# Patient Record
Sex: Female | Born: 1935
Health system: Southern US, Community
[De-identification: ages and names within clinical notes are randomized; demographics above are authoritative.]

## PROBLEM LIST (undated history)

## (undated) DIAGNOSIS — K219 Gastro-esophageal reflux disease without esophagitis: Secondary | ICD-10-CM

## (undated) DIAGNOSIS — K5790 Diverticulosis of intestine, part unspecified, without perforation or abscess without bleeding: Secondary | ICD-10-CM

## (undated) DIAGNOSIS — B9681 Helicobacter pylori [H. pylori] as the cause of diseases classified elsewhere: Secondary | ICD-10-CM

## (undated) DIAGNOSIS — K222 Esophageal obstruction: Secondary | ICD-10-CM

## (undated) DIAGNOSIS — T7840XA Allergy, unspecified, initial encounter: Secondary | ICD-10-CM

## (undated) DIAGNOSIS — I10 Essential (primary) hypertension: Secondary | ICD-10-CM

## (undated) DIAGNOSIS — K2970 Gastritis, unspecified, without bleeding: Secondary | ICD-10-CM

## (undated) DIAGNOSIS — I499 Cardiac arrhythmia, unspecified: Secondary | ICD-10-CM

## (undated) DIAGNOSIS — R55 Syncope and collapse: Secondary | ICD-10-CM

## (undated) DIAGNOSIS — K648 Other hemorrhoids: Secondary | ICD-10-CM

## (undated) HISTORY — PX: TONSILLECTOMY: SHX5217

## (undated) HISTORY — DX: Diverticulosis of intestine, part unspecified, without perforation or abscess without bleeding: K57.90

## (undated) HISTORY — DX: Allergy, unspecified, initial encounter: T78.40XA

## (undated) HISTORY — PX: OTHER SURGICAL HISTORY: SHX169

## (undated) HISTORY — DX: Other hemorrhoids: K64.8

## (undated) HISTORY — DX: Cardiac arrhythmia, unspecified: I49.9

## (undated) HISTORY — DX: Helicobacter pylori (H. pylori) as the cause of diseases classified elsewhere: B96.81

## (undated) HISTORY — DX: Esophageal obstruction: K22.2

## (undated) HISTORY — DX: Syncope and collapse: R55

## (undated) HISTORY — PX: TONSILLECTOMY: SUR1361

## (undated) HISTORY — DX: Essential (primary) hypertension: I10

## (undated) HISTORY — DX: Gastro-esophageal reflux disease without esophagitis: K21.9

## (undated) HISTORY — DX: Gastritis, unspecified, without bleeding: K29.70

---

## 1972-10-29 HISTORY — PX: ABDOMINAL HYSTERECTOMY: SHX81

## 1998-04-10 ENCOUNTER — Emergency Department (HOSPITAL_COMMUNITY): Admission: EM | Admit: 1998-04-10 | Discharge: 1998-04-10 | Payer: Self-pay | Admitting: Emergency Medicine

## 1998-04-21 ENCOUNTER — Encounter: Admission: RE | Admit: 1998-04-21 | Discharge: 1998-07-20 | Payer: Self-pay | Admitting: Sports Medicine

## 1999-01-27 ENCOUNTER — Other Ambulatory Visit: Admission: RE | Admit: 1999-01-27 | Discharge: 1999-01-27 | Payer: Self-pay | Admitting: Obstetrics and Gynecology

## 1999-12-27 ENCOUNTER — Encounter: Admission: RE | Admit: 1999-12-27 | Discharge: 1999-12-27 | Payer: Self-pay | Admitting: Internal Medicine

## 2000-02-02 ENCOUNTER — Encounter: Admission: RE | Admit: 2000-02-02 | Discharge: 2000-02-22 | Payer: Self-pay | Admitting: Sports Medicine

## 2000-06-07 ENCOUNTER — Other Ambulatory Visit: Admission: RE | Admit: 2000-06-07 | Discharge: 2000-06-07 | Payer: Self-pay | Admitting: *Deleted

## 2001-01-23 ENCOUNTER — Encounter: Admission: RE | Admit: 2001-01-23 | Discharge: 2001-01-23 | Payer: Self-pay | Admitting: Internal Medicine

## 2001-01-29 ENCOUNTER — Emergency Department (HOSPITAL_COMMUNITY): Admission: EM | Admit: 2001-01-29 | Discharge: 2001-01-29 | Payer: Self-pay | Admitting: Emergency Medicine

## 2001-01-29 ENCOUNTER — Encounter: Payer: Self-pay | Admitting: Emergency Medicine

## 2001-07-04 ENCOUNTER — Other Ambulatory Visit: Admission: RE | Admit: 2001-07-04 | Discharge: 2001-07-04 | Payer: Self-pay | Admitting: Obstetrics and Gynecology

## 2002-01-30 ENCOUNTER — Encounter: Admission: RE | Admit: 2002-01-30 | Discharge: 2002-01-30 | Payer: Self-pay | Admitting: Internal Medicine

## 2002-10-29 DIAGNOSIS — K5790 Diverticulosis of intestine, part unspecified, without perforation or abscess without bleeding: Secondary | ICD-10-CM

## 2002-10-29 HISTORY — DX: Diverticulosis of intestine, part unspecified, without perforation or abscess without bleeding: K57.90

## 2003-02-03 ENCOUNTER — Encounter: Admission: RE | Admit: 2003-02-03 | Discharge: 2003-02-03 | Payer: Self-pay | Admitting: Internal Medicine

## 2003-12-13 ENCOUNTER — Emergency Department (HOSPITAL_COMMUNITY): Admission: EM | Admit: 2003-12-13 | Discharge: 2003-12-13 | Payer: Self-pay | Admitting: Emergency Medicine

## 2003-12-24 ENCOUNTER — Encounter: Admission: RE | Admit: 2003-12-24 | Discharge: 2003-12-24 | Payer: Self-pay | Admitting: Internal Medicine

## 2004-03-17 ENCOUNTER — Other Ambulatory Visit: Admission: RE | Admit: 2004-03-17 | Discharge: 2004-03-17 | Payer: Self-pay | Admitting: Obstetrics and Gynecology

## 2004-03-24 ENCOUNTER — Encounter: Admission: RE | Admit: 2004-03-24 | Discharge: 2004-03-24 | Payer: Self-pay | Admitting: Internal Medicine

## 2004-07-10 ENCOUNTER — Ambulatory Visit (HOSPITAL_COMMUNITY): Admission: RE | Admit: 2004-07-10 | Discharge: 2004-07-10 | Payer: Self-pay | Admitting: Internal Medicine

## 2004-10-29 DIAGNOSIS — B9681 Helicobacter pylori [H. pylori] as the cause of diseases classified elsewhere: Secondary | ICD-10-CM

## 2004-10-29 HISTORY — DX: Helicobacter pylori (H. pylori) as the cause of diseases classified elsewhere: B96.81

## 2004-11-20 ENCOUNTER — Ambulatory Visit: Payer: Self-pay | Admitting: Internal Medicine

## 2004-11-21 ENCOUNTER — Ambulatory Visit: Payer: Self-pay

## 2004-11-27 ENCOUNTER — Ambulatory Visit: Payer: Self-pay | Admitting: Adult Health

## 2005-01-26 ENCOUNTER — Ambulatory Visit: Payer: Self-pay | Admitting: Internal Medicine

## 2005-04-17 ENCOUNTER — Ambulatory Visit: Payer: Self-pay | Admitting: Internal Medicine

## 2005-04-19 ENCOUNTER — Encounter: Admission: RE | Admit: 2005-04-19 | Discharge: 2005-04-19 | Payer: Self-pay | Admitting: Internal Medicine

## 2005-05-15 ENCOUNTER — Ambulatory Visit: Payer: Self-pay | Admitting: Internal Medicine

## 2005-07-11 ENCOUNTER — Ambulatory Visit: Payer: Self-pay | Admitting: Gastroenterology

## 2005-07-16 ENCOUNTER — Encounter (INDEPENDENT_AMBULATORY_CARE_PROVIDER_SITE_OTHER): Payer: Self-pay | Admitting: Specialist

## 2005-07-16 ENCOUNTER — Ambulatory Visit: Payer: Self-pay | Admitting: Gastroenterology

## 2005-11-13 ENCOUNTER — Encounter (INDEPENDENT_AMBULATORY_CARE_PROVIDER_SITE_OTHER): Payer: Self-pay | Admitting: Cardiology

## 2005-11-13 ENCOUNTER — Ambulatory Visit: Payer: Self-pay | Admitting: Internal Medicine

## 2005-11-13 ENCOUNTER — Inpatient Hospital Stay (HOSPITAL_COMMUNITY): Admission: EM | Admit: 2005-11-13 | Discharge: 2005-11-14 | Payer: Self-pay | Admitting: Emergency Medicine

## 2005-11-22 ENCOUNTER — Ambulatory Visit: Payer: Self-pay | Admitting: Internal Medicine

## 2005-12-07 ENCOUNTER — Ambulatory Visit: Payer: Self-pay | Admitting: Internal Medicine

## 2006-01-04 ENCOUNTER — Ambulatory Visit: Payer: Self-pay | Admitting: Internal Medicine

## 2006-01-07 ENCOUNTER — Ambulatory Visit: Payer: Self-pay | Admitting: Gastroenterology

## 2006-01-15 ENCOUNTER — Ambulatory Visit: Payer: Self-pay | Admitting: Gastroenterology

## 2006-04-25 ENCOUNTER — Encounter: Admission: RE | Admit: 2006-04-25 | Discharge: 2006-04-25 | Payer: Self-pay | Admitting: Internal Medicine

## 2006-07-09 ENCOUNTER — Ambulatory Visit: Payer: Self-pay | Admitting: Internal Medicine

## 2007-05-29 ENCOUNTER — Encounter: Admission: RE | Admit: 2007-05-29 | Discharge: 2007-05-29 | Payer: Self-pay | Admitting: Family Medicine

## 2008-08-09 ENCOUNTER — Encounter: Admission: RE | Admit: 2008-08-09 | Discharge: 2008-08-09 | Payer: Self-pay | Admitting: Family Medicine

## 2008-08-18 ENCOUNTER — Encounter: Admission: RE | Admit: 2008-08-18 | Discharge: 2008-08-18 | Payer: Self-pay | Admitting: Family Medicine

## 2009-08-10 ENCOUNTER — Encounter: Admission: RE | Admit: 2009-08-10 | Discharge: 2009-08-10 | Payer: Self-pay | Admitting: Family Medicine

## 2010-06-19 ENCOUNTER — Emergency Department (HOSPITAL_COMMUNITY): Admission: EM | Admit: 2010-06-19 | Discharge: 2010-06-19 | Payer: Self-pay | Admitting: Emergency Medicine

## 2010-08-14 ENCOUNTER — Encounter: Admission: RE | Admit: 2010-08-14 | Discharge: 2010-08-14 | Payer: Self-pay | Admitting: Family Medicine

## 2010-08-25 ENCOUNTER — Encounter: Admission: RE | Admit: 2010-08-25 | Discharge: 2010-08-25 | Payer: Self-pay | Admitting: Family Medicine

## 2010-11-19 ENCOUNTER — Encounter: Payer: Self-pay | Admitting: Family Medicine

## 2010-11-20 ENCOUNTER — Encounter: Payer: Self-pay | Admitting: Family Medicine

## 2011-03-16 NOTE — Assessment & Plan Note (Signed)
[facility]                               PULMONARY OFFICE NOTE   NAME:SCOTTNovalee, Horsfall                         MRN:          [id number]  DATE:07/09/2006                            DOB:          August 15, 1931    HISTORY:  This is a 77 year old black female who continues to complain of  burning and numbness in her right leg but has no significant back pain.  She has been having increasing pain and also a restlessness at night since  she stopped trazodone about a month ago.  She denies any overt symptoms of  depression, fevers, chills, night sweats, weight loss.   MEDICATIONS:  For full inventory of medications,  please see patient sheet  dated July 09, 2006.   PHYSICAL EXAMINATION:  GENERAL:  She is a somewhat depressed ambulatory  black female in no acute distress.  VITAL SIGNS:  Stable.  HEENT:  Unremarkable.  LUNGS:  Lung fields are perfectly clear bilaterally.  HEART:  There is a regular rhythm without murmur, gallop or rub.  ABDOMEN:  Soft. Benign.  EXTREMITIES: Warm without calf tenderness, cyanosis, clubbing or edema. Her  lower extremity exam was normal with negative  straight leg raising.  Patient did have a  subjective area of numbness over the right anterior  shin.   IMPRESSION:  1. Classic radicular pain.  Previously evaluated by Dr. Phoebe Perch correlating      to a positive MRI scan dated July 10, 2004.  I reminded her that      Dr. Phoebe Perch is a neurosurgeon but even though she does not have back      pain the most likely cause of the burning, numbness she is experiencing      is the previously documented disk  disease.  I would like her to make      an appointment to see Dr. Phoebe Perch.  2. Poor sleep hygiene and possible depression related to chronic leg pain.      I recommend that she restart her trazodone 150 mg at bedtime and      titrate this as high as 300 mg at bedtime if necessary to obtain      adequate sleep.  3. I have  arranged to see the patient back for comprehensive health care      evaluation within the next six weeks. We will see her sooner if needed.                                   Charlaine Dalton. Sherene Sires, MD, [facility]   MBW/MedQ  DD:  07/10/2006  DT:  07/10/2006  Job #:  [id number]   cc:   Clydene Fake, M.D.

## 2011-03-16 NOTE — H&P (Signed)
Alexandria Beck, Alexandria Beck                  ACCOUNT NO.:  [id number]   MEDICAL RECORD NO.:  [id number]          PATIENT TYPE:  INP   LOCATION:  1828                         FACILITY:  [facility]   PHYSICIAN:  Casimiro Needle B. Sherene Sires, M.D. [facility] OF BIRTH:  Mar 04, 1931   DATE OF ADMISSION:  11/13/2005  DATE OF DISCHARGE:                                HISTORY & PHYSICAL   CHIEF COMPLAINT:  Syncope.   HISTORY OF PRESENT ILLNESS:  The patient is a 77 year old black female with  borderline hypertension who was admitted through the ER with new onset  syncope.  She states she was in her usual state of health until going to the  bathroom at approximately midnight last night, feeling that she had extra  gas.  She sat on the commode and did not have a bowel movement but was  nauseated, and then when she tried to stand up, passed out without warning,  striking her face and breaking her top set of dentures.  She came to the  emergency room where she appeared hemodynamically stable in sinus rhythm,  but because of the new onset of syncope, I was asked to evaluate her for  admission by the ER staff.   The patient states she was out for maybe 20 minutes, but when she woke up  she did not have any residual drowsiness or disorientation.  She does have a  history of mild dyspnea on exertion that occurred she noticed on the 13th  when she was hanging laundry but did not notice on the 14th when she was  walking back and forth to church.  She denies any exertional chest pain,  orthopnea, PND, or leg swelling.  She denies any change of bowel or bladder  habits but states she is not used to having gas pains in the middle of the  night.  These have now resolved.   PAST MEDICAL HISTORY:  1.  Chronic rhinitis.  2.  Chronic fatigue with insomnia, treated with trazodone.  3.  Dysphagia, status post upper endoscopy with stricture noted in February      of 2002, repeated October 2004, requiring dilatation by Dr. Arlyce Dice and  therefore maintained on PPI therapy daily.  4.  Osteoporosis, but unable to document in July of 2002, unable to take      bisphosphonates because of sensation of globus consistent with poorly      controlled reflux.  5.  Hypertension, controlled with diet.  6.  Mild hyperlipidemia.  Target LDL less than or 60 based on the absence of      additional risk factors.  7.  Status post partial hysterectomy in 1994, followed by Dr. Myrlene Broker.   MEDICATIONS:  1.  Baby aspirin one daily (the patient said that I told her to stop it, but      actually I told her to use instead of 325 to do the baby dose per my      notes and her written instructions).  2.  Zegerid 40 mg at bedtime.  3.  Centrum Silver one daily.  4.  Trazodone 150 mg which is supposed to titrate from one-half to one at      bedtime as needed.   SOCIAL HISTORY:  She has never smoked.  She has been a widow for about 10  years.  She denies any alcohol use.   FAMILY HISTORY:  Positive for stroke in her mother.  Father had lung cancer  and was a smoker.  There is heart disease in her sister in her 68s, brother  in his 69s.   REVIEW OF SYSTEMS:  Taken in detail and essentially negative, except as  outlined above.   PHYSICAL EXAMINATION:  GENERAL:  This is a pleasant black female with  swollen lower lip.  She does not as appear overtly depressed.  VITAL SIGNS:  She is afebrile with no orthostatic changes.  HEENT:  Remarkable for mild swelling of the lower lip and absent upper  dentures.  There is no evidence of trauma.  NECK:  Supple without cervical adenopathy or tenderness.  Trachea is  midline.  No thyromegaly.  Carotid upstrokes are brisk and without bruits.  CHEST:  Completely clear bilateral to auscultation and percussion.  HEART:  Regular rate and rhythm without murmurs, rubs, or gallops.  ABDOMEN:  Soft with no hepatosplenomegaly, masses, or tenderness.  EXTREMITIES:  Warm and without calf tenderness or edema.  Pedal  pulses were  present bilaterally.  NEUROLOGIC:  No focal deficits.  SKIN:  Warm and dry.   LABORATORY DATA:  EKG was normal.  Initial lab profile (i-STAT) in the ER  was also normal.   IMPRESSION:  1.  New onset syncope that developed with abdominal gas and nausea.  This      most likely represents vagovagal syncope, but because she does have a      positive family history of heart disease and hyperlipidemia, we need to      rule her out for myocardial infarction and make sure she did not have an      ischemia-induced arrhythmia.  I therefore am going to admit her to      telemetry and add Zelnorm to her regimen to cover both gastroesophageal      reflux disease and irritable bowel syndrome from a differential of her      new abdominal complaints (note that these have actually resolved at the      time she is in the emergency room).  I am also going to recommend an      echocardiogram to rule out valvular heart disease, although I do not      hear a significant murmur.  2.  Chronic fatigue and insomnia.  I recommend that she increase the      trazodone up to 150 mg at bedtime.  3.  She seems to be getting a bit confused with her instructions.  Although      she has written instructions on how to take her medicines, she deviates      significantly from this and will need to reemphasize the optimal use of      a patient medicine calendar when she leaves the hospital on this      admission.           ______________________________  Charlaine Dalton Sherene Sires, M.D. [facility]     MBW/MEDQ  D:  11/13/2005  T:  11/13/2005  Job:  [id number]

## 2011-03-16 NOTE — Discharge Summary (Signed)
NAMEKENDYL, FESTA                  ACCOUNT NO.:  [id number]   MEDICAL RECORD NO.:  [id number]          PATIENT TYPE:  INP   LOCATION:  2631                         FACILITY:  [facility]   PHYSICIAN:  Charlaine Dalton. Sherene Sires, M.D. [facility] OF BIRTH:  11/26/1930   DATE OF ADMISSION:  11/13/2005  DATE OF DISCHARGE:  11/14/2005                                 DISCHARGE SUMMARY   FINAL DIAGNOSES:  1.  Syncope, secondary to vasovagal mechanism.  2.  Chronic fatigue and insomnia.  3.  Clinical gastroesophageal reflux disease.  4.  Probably irritable bowel syndrome.  5.  Normocytic anemia, unclear etiology.   HISTORY:  Problem 1.  This is a 77 year old black female previously healthy  who had abdominal distention associated with the urge to have a bowel  movement and went to the bathroom on the night of admission and became  nauseated and passed out, hitting the floor and striking her face.  She  thinks she was maybe out for a few minutes, woke up alert but frightened  by the event, and came to emergency room where she appeared to be in sinus  rhythm with normal EKG and normal enzymes, and had no more symptoms.  She  was admitted overnight and placed on telemetry and had no ectopics.  She was  placed empirically on a combination of Citrucel 1 teaspoon b.i.d. and  Zelnorm 6 mg tablets 1/2 b.i.d. and was requesting discharge in the  afternoon following admission.  She was ambulating on telemetry with no  ectopics noted nor any exertional chest discomfort or recurrent abdominal  complaints.   The patient is, therefore, discharged in improved condition the following  medications:  1.  Centrum Silver 1 daily.  2.  Citrucel 1 teaspoon b.i.d.  3.  Trazodone 150 mg q.h.s.  4.  Zegerid 40 mg q.h.s.  5.  Zelnorm 6 mg tablets 1/2 b.i.d.   Problem 2.  Anemia.  Iron studies are pending at the time of discharge.  She  was asked to stop her aspirin.  Ferritin level 9 this admission consistent  with iron  deficiency. Will be followed up as an outpatient.   LABORATORY DATA:  She has a hematocrit of 30.8% on admission which was  unchanged overnight at 29.8 with ferritin level of 9, potassium level of  3.3.  Otherwise, laboratory studies were unremarkable including troponin,  CPKs, and EKG.   DIET:  Regular.   FOLLOW UP:  In one week at which time a CBC will be rechecked and workup for  iron deficiency initiated.           ______________________________  Charlaine Dalton. Sherene Sires, M.D. [facility]     MBW/MEDQ  D:  11/14/2005  T:  11/15/2005  Job:  [id number]

## 2011-06-26 ENCOUNTER — Inpatient Hospital Stay (INDEPENDENT_AMBULATORY_CARE_PROVIDER_SITE_OTHER)
Admission: RE | Admit: 2011-06-26 | Discharge: 2011-06-26 | Disposition: A | Discharge status: Home / Self Care | Payer: PRIVATE HEALTH INSURANCE | Source: Ambulatory Visit | Attending: Family Medicine | Admitting: Family Medicine

## 2011-06-26 DIAGNOSIS — F410 Panic disorder [episodic paroxysmal anxiety] without agoraphobia: Secondary | ICD-10-CM

## 2011-07-30 ENCOUNTER — Ambulatory Visit (INDEPENDENT_AMBULATORY_CARE_PROVIDER_SITE_OTHER): Payer: No Typology Code available for payment source | Admitting: Internal Medicine

## 2011-07-30 ENCOUNTER — Encounter: Payer: Self-pay | Admitting: Internal Medicine

## 2011-07-30 ENCOUNTER — Other Ambulatory Visit: Payer: Self-pay | Admitting: Internal Medicine

## 2011-07-30 ENCOUNTER — Other Ambulatory Visit: Payer: Self-pay | Admitting: Family Medicine

## 2011-07-30 VITALS — BP 162/88 | HR 102 | Temp 97.9°F | Wt 164.0 lb

## 2011-07-30 DIAGNOSIS — Z1231 Encounter for screening mammogram for malignant neoplasm of breast: Secondary | ICD-10-CM

## 2011-07-30 DIAGNOSIS — Z23 Encounter for immunization: Secondary | ICD-10-CM

## 2011-07-30 DIAGNOSIS — K219 Gastro-esophageal reflux disease without esophagitis: Secondary | ICD-10-CM

## 2011-07-30 DIAGNOSIS — I10 Essential (primary) hypertension: Secondary | ICD-10-CM | POA: Insufficient documentation

## 2011-07-30 DIAGNOSIS — R55 Syncope and collapse: Secondary | ICD-10-CM | POA: Insufficient documentation

## 2011-07-30 MED ORDER — ESOMEPRAZOLE MAGNESIUM 40 MG PO CPDR: 40.0000 mg | DELAYED_RELEASE_CAPSULE | Freq: Every day | ORAL | Status: DC

## 2011-07-30 MED ORDER — CLONAZEPAM 0.5 MG PO TABS: 0.5000 mg | ORAL_TABLET | Freq: Every evening | ORAL | Status: DC | PRN

## 2011-07-30 MED ORDER — ZOLPIDEM TARTRATE 5 MG PO TABS: 5.0000 mg | ORAL_TABLET | Freq: Every evening | ORAL | Status: DC | PRN

## 2011-07-30 MED ORDER — FUROSEMIDE 20 MG PO TABS: 20.0000 mg | ORAL_TABLET | Freq: Every day | ORAL | Status: DC

## 2011-07-30 NOTE — Assessment & Plan Note (Signed)
Patient reports that at drugstore monitoring and at home she will have SBP's 140-150's.  Plan - start diuretic therapy - furosdemide 20 mg once a day.           Follow-up in one month

## 2011-07-30 NOTE — Assessment & Plan Note (Signed)
Well controlled on present medication.  Plan - Rx renewed

## 2011-07-30 NOTE — Patient Instructions (Signed)
Blood pressure - from what you have told me there is chronic elevation in your Blood pressure. Plan - furosemide 2o mg once a day to bring down your pressure.  Sleep - good sleep hygiene: regular time for bed and to rise 7 days a week; no stimulants (coffee, chocolate); daily exercise; sleep sanctuary - no reading or TV watching in bed; no extinction behaviors - do NOT lay in bed awake. If you can't sleep get up and go to another room until you are drowsy and can try to sleep. Medication - to be used every 2nd or 3rd night - zolpidem 5 mg at bedtime.  Please come back to see me in about 1 month. We will review your blood pressure, your sleep and I will have reviewed you labs, etc from your previous doctor.

## 2011-07-30 NOTE — Assessment & Plan Note (Signed)
Clonazepam seems to help. Will continue medication. For persistent symptoms will need to consider further evaluation.

## 2011-07-30 NOTE — Progress Notes (Signed)
Subjective:    Patient ID: Alexandria Beck, female    DOB: 03/16/1931, 77 y.o.   MRN: [id number]  HPI Alexandria Beck presents to establish for on-going continuity care. She is not sleeping well. She is awakened by dreams that cause tachycardia and agitation. She is otherwise doing well.   Past Medical History  Diagnosis Date  . Near syncope     diagnosed as anxiety related. gets some relief with clonazepam. No formal testing done.  [name] GERD (gastroesophageal reflux disease)     has had EGD 2006-negative  . Allergy     seasonal  . Irregular heart beat     no testing, holter, etc. Takes no medication  . Hypertension      On no medication. Home readings SBP=150's   Past Surgical History  Procedure Date  . Abdominal hysterectomy 1974    fibroid tumors w/ metorrhagia  . Tonsillectomy   . G4p4 - nsvd  midwife delivery for two    Family History  Problem Relation Age of Onset  . Heart disease Mother   . Stroke Mother   . Hypertension Mother   . Cancer Father     lung cancer - smoker  . Heart disease Sister     MI  . Kidney disease Sister     end stage renal disease on HD  . Diabetes Sister   . Heart disease Brother   . Diabetes Sister   . Hypertension Sister   . Kidney disease Sister   . Cancer Sister   . Diabetes Sister   . Kidney disease Sister     HD  . Hypertension Sister   . Heart disease Sister   . Hypertension Sister   . Heart disease Sister   . Hypertension Sister   . Hypertension Sister    History   Social History  . Marital Status: Widowed    Spouse Name: N/A    Number of Children: 4  . Years of Education: 11   Occupational History  . stock person     retired   Social History Main Topics  . Smoking status: Never Smoker   . Smokeless tobacco: Never Used  . Alcohol Use: No  . Drug Use: No  . Sexually Active: Not Currently   Other Topics Concern  . Not on file   Social History Narrative   11th grade. Married '52- '92/widowed. 2 sons- '55, '63; 2 dtrs  - '54, '60. 12 grandchildren. 7 greatgrands. Lives in own home with a son who is in and out. Work - [employer], Psychologist, prison and probation services.Do you drink Alcoholic Beverages-No. Do you drink caffienated Beverages- No. Do you use seatbelt often- Yes. Do you exercise at least 3 times a week- . Do you take vitamins- . Do you use a hearing aid- No. Do you wear dentures- Yes. Is there a smoke alarm in your house- Yes. Are there guns and firearms in your household- . Have you experienced physical abuse- No.Usual # of hours of sleep per night 3. Number of people living at your residence- 2       Review of Systems Constitutional:  Negative for fever, chills, activity change and unexpected weight change.  HEENT:  Negative for hearing loss, ear pain, congestion, neck stiffness and postnasal drip. Negative for sore throat or swallowing problems. Negative for dental complaints-has full dentures.   Eyes: Negative for vision loss or change in visual acuity. Was a little blurry due to medication.  Respiratory: Negative for chest tightness and  wheezing. Negative for DOE.   Cardiovascular: Negative for chest pain. Has palpitations. Decreased exercise tolerance over the past 6 months Gastrointestinal: No change in bowel habit. No bloating or gas. No reflux or indigestion Genitourinary: Negative for urgency, frequency, flank pain and difficulty urinating.  Musculoskeletal: Positve for myalgias legs - can't let the covers touch her legs. No back pain. Right hip arthralgias but no gait problem.  Neurological: Negative for dizziness, tremors, weakness and headaches.  Hematological: Negative for adenopathy.  Psychiatric/Behavioral: Negative for behavioral problems and dysphoric mood.       Objective:   Physical Exam Vitals elevated Gen'l - pleasant older AA woman in no distress HEENT - C&SD clear Lungs - CTAP Cor- 2+ radial, RRR w/o m/r/g Ext - w/o edema Neuro - A&O x 3, CN II-XII intact, DTRs normal       Assessment & Plan:    Very pleasant new patient. She is oriented to the practice. She will request recent records from her previous doctor. Recommendations for labs will be based on review of records. She will return in one month.

## 2011-08-09 ENCOUNTER — Telehealth: Payer: Self-pay | Admitting: Internal Medicine

## 2011-08-09 ENCOUNTER — Emergency Department (HOSPITAL_COMMUNITY): Payer: Medicare Other

## 2011-08-09 ENCOUNTER — Inpatient Hospital Stay (HOSPITAL_COMMUNITY): Payer: Medicare Other

## 2011-08-09 ENCOUNTER — Observation Stay (HOSPITAL_COMMUNITY)
Admission: EM | Admit: 2011-08-09 | Discharge: 2011-08-10 | Disposition: A | Discharge status: Home / Self Care | Payer: Medicare Other | Source: Ambulatory Visit | Attending: Internal Medicine | Admitting: Internal Medicine

## 2011-08-09 DIAGNOSIS — R55 Syncope and collapse: Principal | ICD-10-CM | POA: Insufficient documentation

## 2011-08-09 DIAGNOSIS — R0602 Shortness of breath: Secondary | ICD-10-CM | POA: Insufficient documentation

## 2011-08-09 DIAGNOSIS — I10 Essential (primary) hypertension: Secondary | ICD-10-CM | POA: Insufficient documentation

## 2011-08-09 DIAGNOSIS — K219 Gastro-esophageal reflux disease without esophagitis: Secondary | ICD-10-CM | POA: Insufficient documentation

## 2011-08-09 DIAGNOSIS — F411 Generalized anxiety disorder: Secondary | ICD-10-CM | POA: Insufficient documentation

## 2011-08-09 DIAGNOSIS — R209 Unspecified disturbances of skin sensation: Secondary | ICD-10-CM | POA: Insufficient documentation

## 2011-08-09 DIAGNOSIS — R29898 Other symptoms and signs involving the musculoskeletal system: Secondary | ICD-10-CM | POA: Insufficient documentation

## 2011-08-09 DIAGNOSIS — R42 Dizziness and giddiness: Secondary | ICD-10-CM | POA: Insufficient documentation

## 2011-08-09 LAB — COMPREHENSIVE METABOLIC PANEL
AST: 24 U/L (ref 0–37)
CO2: 24 mEq/L (ref 19–32)
Chloride: 104 mEq/L (ref 96–112)
Creatinine, Ser: 0.77 mg/dL (ref 0.50–1.10)
GFR calc non Af Amer: 78 mL/min — ABNORMAL LOW (ref >90)
Total Bilirubin: 0.3 mg/dL (ref 0.3–1.2)

## 2011-08-09 LAB — CARDIAC PANEL(CRET KIN+CKTOT+MB+TROPI)
CK, MB: 3.1 ng/mL (ref 0.3–4.0)
CK, MB: 2.8 ng/mL (ref 0.3–4.0)
Relative Index: 1.7 (ref 0.0–2.5)
Relative Index: 1.2 (ref 0.0–2.5)
Total CK: 178 U/L — ABNORMAL HIGH (ref 7–177)
Total CK: 225 U/L — ABNORMAL HIGH (ref 7–177)
Troponin I: <0.3 ng/mL (ref <0.30)

## 2011-08-09 LAB — DIFFERENTIAL
Basophils Absolute: 0 10*3/uL (ref 0.0–0.1)
Lymphocytes Relative: 33 % (ref 12–46)
Lymphs Abs: 2.4 10*3/uL (ref 0.7–4.0)
Monocytes Absolute: 0.6 10*3/uL (ref 0.1–1.0)
Monocytes Relative: 8 % (ref 3–12)
Neutro Abs: 3.9 10*3/uL (ref 1.7–7.7)

## 2011-08-09 LAB — POCT I-STAT TROPONIN I

## 2011-08-09 LAB — APTT: aPTT: 27 seconds (ref 24–37)

## 2011-08-09 LAB — CBC
HCT: 37.5 % (ref 36.0–46.0)
Hemoglobin: 12.9 g/dL (ref 12.0–15.0)
MCH: 30.4 pg (ref 26.0–34.0)
MCHC: 34.4 g/dL (ref 30.0–36.0)

## 2011-08-09 LAB — URINALYSIS, ROUTINE W REFLEX MICROSCOPIC
Bilirubin Urine: NEGATIVE
Hgb urine dipstick: NEGATIVE
Ketones, ur: NEGATIVE mg/dL
Protein, ur: NEGATIVE mg/dL
Urobilinogen, UA: 0.2 mg/dL (ref 0.0–1.0)

## 2011-08-09 LAB — D-DIMER, QUANTITATIVE: D-Dimer, Quant: 0.41 ug/mL-FEU (ref 0.00–0.48)

## 2011-08-09 LAB — RPR: RPR Ser Ql: NONREACTIVE

## 2011-08-09 LAB — CK TOTAL AND CKMB (NOT AT ARMC): Relative Index: 1.8 (ref 0.0–2.5)

## 2011-08-09 LAB — HEMOGLOBIN A1C: Mean Plasma Glucose: 134 mg/dL — ABNORMAL HIGH (ref <117)

## 2011-08-09 LAB — PROTIME-INR: Prothrombin Time: 13.3 seconds (ref 11.6–15.2)

## 2011-08-09 NOTE — Telephone Encounter (Signed)
Received copies from Dr. Lamount Cohen 08/09/2011. Forwarded  15pages to Dr. Reva Bores review.

## 2011-08-09 NOTE — H&P (Signed)
Alexandria Beck, Alexandria Beck NO.:  [id number]  MEDICAL RECORD NO.:  [id number]  LOCATION:  [facility]                         FACILITY:  [facility]  PHYSICIAN:  Conley Canal, MD      DATE OF BIRTH:  1931-10-28  DATE OF ADMISSION:  08/09/2011 DATE OF DISCHARGE:                             HISTORY & PHYSICAL   PRIMARY CARE PHYSICIAN:  Alexandria Gess. Norins, MD  CHIEF COMPLAINT:  Numbness involving the left occipital area associated with heaviness of the tongue and transient weakness involving the left side x2 days.  HISTORY OF PRESENT ILLNESS:  [name] is a pleasant 77 year old female who has a history of anxiety disorder, insomnia, and gastroesophageal reflux disease who comes in with complaints of numbness which started in the left occipital area since this morning.  She states that she had a funny feeling on that side of the head but did not have any headache. She also felt dizzy and felt heaviness of the tongue followed by some shortness of breath.  She mentions that yesterday she noticed some gait instability on the left side, but this also resolved on its own.  The symptoms today started while she was sitting watching TV, they have since resolved.  She denies chest pain, nausea, or diaphoresis.  No fever.  No diarrhea.  She attributes the symptoms to clonazepam which was started on July 30, 2011, however, she has not taken the clonazepam for a few days now.  In the emergency room, the patient had EKG which showed some nonspecific T-wave abnormalities which are diffuse.  Otherwise, a chest x-ray and CT of the brain were unremarkable as was her CBC and CMP.  She was referred to the Hospitalist Service for further workup.  PAST MEDICAL HISTORY: 1. Anxiety disorder. 2. Gastroesophageal reflux disease. 3. Questionable history of hypertension, not on medications. 4. Insomnia.  HOME MEDICATIONS:  Vitamin B12, Nexium, Lasix, Ambien, clonazepam, and Aleve.  SOCIAL  HISTORY:  The patient lives alone.  Denies cigarette smoking, alcohol, or illicit drugs.  FAMILY HISTORY:  Positive for stroke and MI in her mother.  Diabetes mellitus in patient's sister.  REVIEW OF SYSTEMS:  Unremarkable except as highlighted in the history of present illness.  PHYSICAL EXAMINATION:  GENERAL:  This is a well-looking elderly lady who is not in acute distress. VITAL SIGNS:  Blood pressure 155/80, heart rate is 76, temperature 97.8, respirations 22, and oxygen saturations 100% on room air. HEAD, EARS, NOSE, AND THROAT:  Pupils are equal and reacting to light. No jugular venous distention.  No carotid bruits. RESPIRATORY SYSTEM:  Good air entry bilaterally with no rhonchi, rales, or wheezes. CARDIOVASCULAR SYSTEM:  First and second heart sounds are heard.  No murmurs.  Pulse regular. ABDOMEN:  Soft and nontender.  No palpable organomegaly.  Bowel sounds are normal. CNS:  The patient is alert, oriented to person, place, and time.  She does not have any acute neurological deficits.  No cerebellar signs. EXTREMITIES:  No pedal edema.  Peripheral pulses are equal.  LABORATORY DATA:  Reviewed and significant for normal CBC.  Point-of- care troponins negative x1.  Electrolytes significant for potassium of 3.6, otherwise  essentially normal.  Urinalysis negative.  Chest x-ray shows no acute cardiopulmonary process.  CT of the brain without contrast showed no evidence of acute intracranial abnormality.  EKG showed sinus rhythm with nonspecific ST-T wave changes with flattening of the T-waves throughout the precordial leads.  IMPRESSION:  [name] who has a history of anxiety disorder, gastroesophageal reflux disease and some family history of coronary artery disease who comes in with complaints of numbness and some transient weakness involving the left side associated with some shortness of breath, likely she had a transient ischemic attack  versus posterior cerebral circulation nonhemorrhagic infarct which would be small.  She could potentially have cardiopulmonary process including acute coronary syndrome, unlikely pulmonary embolism. Her blood pressure is also elevated at 155/80.  My suspicion is she likely has essential hypertension.  PLAN: 1. TIA versus acute cardiopulmonary process.  We will admit the     patient to telemetry observation to Dr. Izora Ribas service to pursue     TIA workup including MRI of the brain, bilateral carotid duplex, 2-     D echocardiogram.  Also obtain vitamin B12 level, RPR, TSH, lipids     panel, cardiac enzymes, and D-dimer level.  Meanwhile, the patient     will be started on aspirin, Lopressor, and Zocor. 2. Anxiety disorder.  The patient mentioned that she stopped taking     clonazepam believing that it was causing her problems, but she     still had these symptoms after stopping clonazepam for a few days.     We will keep her on clonazepam for now and defer long-term use     decision to Dr. Debby Bud. 3. Hypertension.  We will start the patient on low-dose beta-blocker. 4. Gastroesophageal reflux disease.  PPI. 5. DVT prophylaxis.  Lovenox. 6. The patient's condition is fair.     Conley Canal, MD     SR/MEDQ  D:  08/09/2011  T:  08/09/2011  Job:  [id number]  cc:   Alexandria Gess. Norins, MD  Electronically Signed by Conley Canal  on 08/09/2011 03:12:56 PM

## 2011-08-10 DIAGNOSIS — R55 Syncope and collapse: Secondary | ICD-10-CM

## 2011-08-10 DIAGNOSIS — I517 Cardiomegaly: Secondary | ICD-10-CM

## 2011-08-10 LAB — CARDIAC PANEL(CRET KIN+CKTOT+MB+TROPI)
CK, MB: 2.6 ng/mL (ref 0.3–4.0)
Total CK: 211 U/L — ABNORMAL HIGH (ref 7–177)
Troponin I: <0.3 ng/mL (ref <0.30)

## 2011-08-10 LAB — GLUCOSE, CAPILLARY: Glucose-Capillary: 104 mg/dL — ABNORMAL HIGH (ref 70–99)

## 2011-08-10 LAB — LIPID PANEL: Cholesterol: 203 mg/dL — ABNORMAL HIGH (ref 0–200)

## 2011-08-17 ENCOUNTER — Ambulatory Visit (INDEPENDENT_AMBULATORY_CARE_PROVIDER_SITE_OTHER): Payer: Medicare Other | Admitting: Internal Medicine

## 2011-08-17 ENCOUNTER — Ambulatory Visit
Admission: RE | Admit: 2011-08-17 | Discharge: 2011-08-17 | Disposition: A | Discharge status: Home / Self Care | Payer: Medicare Other | Source: Ambulatory Visit | Attending: Internal Medicine | Admitting: Internal Medicine

## 2011-08-17 DIAGNOSIS — R29898 Other symptoms and signs involving the musculoskeletal system: Secondary | ICD-10-CM

## 2011-08-17 DIAGNOSIS — I10 Essential (primary) hypertension: Secondary | ICD-10-CM

## 2011-08-17 DIAGNOSIS — Z1231 Encounter for screening mammogram for malignant neoplasm of breast: Secondary | ICD-10-CM

## 2011-08-17 DIAGNOSIS — F419 Anxiety disorder, unspecified: Secondary | ICD-10-CM

## 2011-08-17 DIAGNOSIS — F411 Generalized anxiety disorder: Secondary | ICD-10-CM

## 2011-08-17 DIAGNOSIS — M6281 Muscle weakness (generalized): Secondary | ICD-10-CM

## 2011-08-17 MED ORDER — SERTRALINE HCL 25 MG PO TABS: ORAL_TABLET | ORAL | Status: DC

## 2011-08-17 NOTE — Patient Instructions (Signed)
TIA - evaluation in the hospital was 100% negative. Please continue to take Aspirin daily.  Nerves - will have you try sertraline (zoloft) 25mg  once a day for anxiety. Call me back in 3 weeks to let me know how it is working.

## 2011-08-17 NOTE — Progress Notes (Signed)
  Subjective:    Patient ID: Alexandria Beck, female    DOB: 05/21/1931, 77 y.o.   MRN: [id number]  HPI Mrs. Coste presents for hospital follow-up- she had been admitted to rule out TIA due to transient left sided weakness, thick tongue and a funny feeling in her head. Hospital record reviewed. She had a totally negative work-up. Since discharge she has been doing well. She was taken off Klonipin as a causative agent for tongue thickness and she is asking for something for her "nerves."  PMH, FamHx and SocHx reviewed for any changes and relevance.    Review of Systems Constitutional:  Negative for fever, chills, activity change and unexpected weight change.  HEENT:  Negative for hearing loss, ear pain, congestion, neck stiffness and postnasal drip. Negative for sore throat or swallowing problems. Negative for dental complaints.   Eyes: Negative for vision loss or change in visual acuity.  Respiratory: Negative for chest tightness and wheezing. Negative for DOE.   Cardiovascular: Negative for chest pain or palpitations. No decreased exercise tolerance Gastrointestinal: No change in bowel habit. No bloating or gas. No reflux or indigestion Genitourinary: Negative for urgency, frequency, flank pain and difficulty urinating.  Musculoskeletal: Negative for myalgias, back pain, arthralgias and gait problem.  Neurological: Negative for dizziness, tremors, weakness and headaches.  Hematological: Negative for adenopathy.  Psychiatric/Behavioral: Negative for behavioral problems and dysphoric mood.       Objective:   Physical Exam Vitals - normal Pulm - normal respirations Cor -RRR  Neuro - A&O x 3, CN II-XII grossly in tact. Normal gait. Psych - calm and in no distress       Assessment & Plan:  1. Transient weakness - full evaluation done in hospital which was unrevealing. Her symptoms completely resolved. Her feeling of a thick tongue may have been medication related.  Plan - no further  evaluation needed

## 2011-08-19 DIAGNOSIS — F419 Anxiety disorder, unspecified: Secondary | ICD-10-CM | POA: Insufficient documentation

## 2011-08-19 NOTE — Assessment & Plan Note (Signed)
She reports continued problems with her "nerves" on a daily basis.  Plan - trial of sertraline 25 mg daily

## 2011-08-19 NOTE — Assessment & Plan Note (Signed)
BP Readings from Last 3 Encounters:  08/17/11 136/82  07/30/11 162/88   Good control at today's visit.   Plan - continue diuretic monotherapy

## 2011-08-20 NOTE — Discharge Summary (Signed)
NAMEALIEGHA, PAULLIN                  ACCOUNT NO.:  [id number]  MEDICAL RECORD NO.:  [id number]  LOCATION:                                 FACILITY:  PHYSICIAN:  Rosalyn Gess. Zsazsa Bahena, MD  DATE OF BIRTH:  [birth year]  DATE OF ADMISSION:  08/09/2011 DATE OF DISCHARGE:  08/10/2011                              DISCHARGE SUMMARY   ADMITTING DIAGNOSIS:  Rule out transient ischemic attack.  DISCHARGE DIAGNOSIS:  Transient ischemic attack ruled out.  CONSULTANTS:  None.  PROCEDURES: 1. CT scan of the brain with no evidence of acute intracranial     abnormality performed in the emergency department. 2. Chest x-ray at admission which showed no acute cardiopulmonary     disease. 3. MRI scan of the brain which showed no acute or focal intracranial     abnormality to explain patient's symptoms.  Mild generalized     atrophy and scattered T2 and FLAIR white matter lesions.  These     reflect the sequelae of chronic microvascular ischemia.  Bilateral     remote lacunar infarct versus dilated perivascular spaces of the     basal ganglia noted. 4. MRA of the brain, which showed normal brain MRA, circle of Willis     without evidence for significant proximal stenosis, aneurysm, or     branch vessel occlusion. 5. A 2D echo which showed normal left ventricular cavity.  EF was 60-     65% with normal wall motion.  The patient with grade 1 diastolic     dysfunction. 6. Carotid Doppler study which showed no significant extracranial     carotid artery stenosis.  Vertebrals were patent with antegrade     flow.  HISTORY OF PRESENT ILLNESS:  Ms. Cinelli is a 77 year old woman with a history of anxiety disorder and insomnia who reported she had a sensation of numbness at the left occipital area that was present on the morning of admission.  She also reported that she felt dizzy and had a heaviness of her tongue which she attributed to clonazepam.  She had mentioned that the day prior to admission, she had  some mild gait instability on the left side but this resolved on its own.  On the morning of admission while watching television, the patient reported her symptoms had resolved.  In the emergency department, the patient was awake, alert, and oriented.  She was admitted to rule out TIA.  Please see the H and P and Epic generated records for past medical history, family history, and social history.  HOSPITAL COURSE:  The patient was admitted to the telemetry floor.  She had evaluation that included MRI, MRA of the brain, carotid Doppler, 2D echo, routine laboratory.  The patient's evaluation was normal.  The patient was seen on the 12th and was doing well.  Laboratories were unremarkable with a normal A1c at 6.3%.  B12 was normal at 814.  TSH was normal.  RPR was negative.  Sed rate was 35.  D-dimer was normal at 0.41.  With the patient on exam having no neurologic findings with her labs and imaging studies being normal with PT evaluation having been ordered  and complete showing the patient had no physical therapy needs and had no mobility impairment, she was felt to be stable and ready for discharge home.  Verbal order was given for the patient to be discharged.  The medication discharge manager was completed.  The patient will be seen in the office for followup in 1 week.  Her condition at time of discharge was stable with no evidence of acute intracranial process or active stroke.     Rosalyn Gess Laron Angelini, MD     MEN/MEDQ  D:  08/12/2011  T:  08/13/2011  Job:  [id number]  Electronically Signed by Illene Regulus MD on 08/20/2011 04:22:26 AM

## 2011-08-21 ENCOUNTER — Other Ambulatory Visit: Payer: Self-pay | Admitting: Internal Medicine

## 2011-08-21 DIAGNOSIS — R928 Other abnormal and inconclusive findings on diagnostic imaging of breast: Secondary | ICD-10-CM

## 2011-08-28 ENCOUNTER — Ambulatory Visit (INDEPENDENT_AMBULATORY_CARE_PROVIDER_SITE_OTHER): Payer: Medicare Other | Admitting: Internal Medicine

## 2011-08-28 ENCOUNTER — Encounter: Payer: Self-pay | Admitting: Internal Medicine

## 2011-08-28 VITALS — BP 138/80 | HR 84 | Temp 98.4°F | Resp 16 | Wt 163.0 lb

## 2011-08-28 DIAGNOSIS — L259 Unspecified contact dermatitis, unspecified cause: Secondary | ICD-10-CM

## 2011-08-28 DIAGNOSIS — L509 Urticaria, unspecified: Secondary | ICD-10-CM | POA: Insufficient documentation

## 2011-08-28 DIAGNOSIS — L309 Dermatitis, unspecified: Secondary | ICD-10-CM

## 2011-08-28 MED ORDER — HYDROXYZINE HCL 25 MG PO TABS: 25.0000 mg | ORAL_TABLET | Freq: Three times a day (TID) | ORAL | Status: AC | PRN

## 2011-08-28 MED ORDER — HYDROCORTISONE 2.5 % EX CREA: TOPICAL_CREAM | CUTANEOUS | Status: DC

## 2011-08-28 NOTE — Assessment & Plan Note (Signed)
Will try atarax and topical steroid cream

## 2011-08-28 NOTE — Patient Instructions (Signed)

## 2011-08-28 NOTE — Progress Notes (Signed)
  Subjective:    Patient ID: Alexandria Beck, female    DOB: 10/29/1931, 77 y.o.   MRN: [id number]  Rash This is a recurrent problem. The current episode started more than 1 month ago. The problem is unchanged. The rash is diffuse. The rash is characterized by itchiness. She was exposed to nothing. Pertinent negatives include no anorexia, congestion, cough, diarrhea, eye pain, facial edema, fatigue, fever, nail changes, rhinorrhea, shortness of breath, sore throat or vomiting. Past treatments include nothing.      Review of Systems  Constitutional: Negative for fever, chills, diaphoresis, activity change, appetite change, fatigue and unexpected weight change.  HENT: Negative.  Negative for congestion, sore throat and rhinorrhea.   Eyes: Negative.  Negative for pain.  Respiratory: Negative for apnea, cough, shortness of breath, wheezing and stridor.   Cardiovascular: Negative for chest pain, palpitations and leg swelling.  Gastrointestinal: Negative for nausea, vomiting, abdominal pain, diarrhea, constipation, blood in stool and anorexia.  Genitourinary: Negative for dysuria, urgency, frequency, hematuria, flank pain, decreased urine volume, enuresis, difficulty urinating and dyspareunia.  Musculoskeletal: Negative for myalgias, back pain, joint swelling, arthralgias and gait problem.  Skin: Positive for rash. Negative for nail changes, color change, pallor and wound.  Neurological: Negative for dizziness, seizures, facial asymmetry, weakness, light-headedness, numbness and headaches.  Hematological: Negative for adenopathy. Does not bruise/bleed easily.  Psychiatric/Behavioral: Negative.        Objective:   Physical Exam  Vitals reviewed. Constitutional: She is oriented to person, place, and time. She appears well-developed and well-nourished. No distress.  HENT:  Head: Normocephalic and atraumatic.  Mouth/Throat: Oropharynx is clear and moist. No oropharyngeal exudate.  Eyes:  Conjunctivae are normal. Right eye exhibits no discharge. Left eye exhibits no discharge. No scleral icterus.  Neck: Normal range of motion. Neck supple. No JVD present. No tracheal deviation present. No thyromegaly present.  Cardiovascular: Normal rate, regular rhythm, normal heart sounds and intact distal pulses.  Exam reveals no gallop and no friction rub.   No murmur heard. Pulmonary/Chest: Effort normal and breath sounds normal. No stridor. No respiratory distress. She has no wheezes. She has no rales. She exhibits no tenderness.  Abdominal: Soft. Bowel sounds are normal. She exhibits no distension and no mass. There is no tenderness. There is no rebound and no guarding.  Musculoskeletal: Normal range of motion. She exhibits no edema and no tenderness.  Lymphadenopathy:    She has no cervical adenopathy.  Neurological: She is oriented to person, place, and time. She displays normal reflexes. She exhibits normal muscle tone. Coordination normal.  Skin: Skin is warm, dry and intact. Rash noted. No abrasion, no bruising, no burn, no ecchymosis, no laceration, no lesion, no petechiae and no purpura noted. Rash is papular (with xerosis) and urticarial. Rash is not macular, not nodular, not pustular and not vesicular. She is not diaphoretic. No cyanosis or erythema. No pallor. Nails show no clubbing.  Psychiatric: She has a normal mood and affect. Her behavior is normal. Judgment and thought content normal.          Assessment & Plan:

## 2011-08-30 ENCOUNTER — Ambulatory Visit: Payer: No Typology Code available for payment source | Admitting: Internal Medicine

## 2011-09-03 ENCOUNTER — Ambulatory Visit
Admission: RE | Admit: 2011-09-03 | Discharge: 2011-09-03 | Disposition: A | Discharge status: Home / Self Care | Payer: Medicare Other | Source: Ambulatory Visit | Attending: Internal Medicine | Admitting: Internal Medicine

## 2011-09-03 DIAGNOSIS — R928 Other abnormal and inconclusive findings on diagnostic imaging of breast: Secondary | ICD-10-CM

## 2011-09-24 ENCOUNTER — Telehealth: Payer: Self-pay | Admitting: *Deleted

## 2011-09-24 NOTE — Telephone Encounter (Signed)
Is she using the cortisone cream? If not do, if using - and no improvement will need OV

## 2011-09-24 NOTE — Telephone Encounter (Signed)
Pt states she is not using cortisone. She states she will start using and call back in a couple of days if no improvement

## 2011-09-24 NOTE — Telephone Encounter (Signed)
Pt was seen on 08/28/2011 by Dr Yetta Barre and given atarax cream for eczema, she states this cream is not helping at all. She would like to know if there is anything else she can try? Please Advise

## 2011-09-28 ENCOUNTER — Emergency Department (HOSPITAL_COMMUNITY)
Admission: EM | Admit: 2011-09-28 | Discharge: 2011-09-28 | Disposition: A | Discharge status: Home / Self Care | Payer: Medicare Other | Source: Home / Self Care

## 2011-09-28 ENCOUNTER — Encounter (HOSPITAL_COMMUNITY): Payer: Self-pay | Admitting: *Deleted

## 2011-09-28 DIAGNOSIS — L509 Urticaria, unspecified: Secondary | ICD-10-CM

## 2011-09-28 MED ORDER — TRIAMCINOLONE ACETONIDE 0.1 % EX CREA: TOPICAL_CREAM | Freq: Two times a day (BID) | CUTANEOUS | Status: DC

## 2011-09-28 MED ORDER — METHYLPREDNISOLONE ACETATE 80 MG/ML IJ SUSP
INTRAMUSCULAR | Status: AC
  Filled 2011-09-28: qty 1

## 2011-09-28 MED ORDER — METHYLPREDNISOLONE ACETATE 80 MG/ML IJ SUSP
80.0000 mg | Freq: Once | INTRAMUSCULAR | Status: AC
  Administered 2011-09-28: 80 mg via INTRAMUSCULAR

## 2011-09-28 NOTE — ED Notes (Signed)
Pt seen by her PCP a month ago for itching and was told it was Eczema.  Has used Hydroxyzine and Hydrocortisone cream for past month and states it's not better.  Has a "patch" on left upper arm, right wrist, and buttocks.  Has run out of the Hydroxyzine.

## 2011-09-28 NOTE — ED Provider Notes (Signed)
History     CSN: [id number] Arrival date & time: 09/28/2011 10:08 AM   None     Chief Complaint  Patient presents with  . Pruritis    (Consider location/radiation/quality/duration/timing/severity/associated sxs/prior treatment) HPI Comments: Pt states she has an itchy recurrent rash. Worse in the evenings, and sometimes awakens her from sleep. The rash is red welts that come and go, worse on back of thighs, sometimes on buttocks, abdomen and back. Does not notice it on extremities, or face. She was seen by her PCP 08-28-11 and rx'd Hydroxyzine and Hydrocortisone cream 2.5 % and states this has not helped. She denies new soaps, laundry products, lotions, or medications. Pt states her sisters have had similar rashes recently.  Patient is a 77 y.o. female presenting with rash. The history is provided by the patient.  Rash  This is a chronic problem. Episode onset: over one month ago. The problem has not changed since onset.The problem is associated with nothing. There has been no fever. Affected Location: increased posterior thighs bilat, also back and abdomen. The patient is experiencing no pain. Associated symptoms include itching. Pertinent negatives include no blisters, no pain and no weeping. She has tried anti-itch cream for the symptoms. The treatment provided no relief.    Past Medical History  Diagnosis Date  . Near syncope     diagnosed as anxiety related. gets some relief with clonazepam. No formal testing done.  [name] GERD (gastroesophageal reflux disease)     has had EGD 2006-negative  . Allergy     seasonal  . Irregular heart beat     no testing, holter, etc. Takes no medication  . Hypertension      On no medication. Home readings SBP=150's    Past Surgical History  Procedure Date  . Abdominal hysterectomy 1974    fibroid tumors w/ metorrhagia  . Tonsillectomy   . G4p4 - nsvd  midwife delivery for two     Family History  Problem Relation Age of Onset  . Heart  disease Mother   . Stroke Mother   . Hypertension Mother   . Cancer Father     lung cancer - smoker  . Heart disease Sister     MI  . Kidney disease Sister     end stage renal disease on HD  . Diabetes Sister   . Heart disease Brother   . Diabetes Sister   . Hypertension Sister   . Kidney disease Sister   . Cancer Sister   . Diabetes Sister   . Kidney disease Sister     HD  . Hypertension Sister   . Heart disease Sister   . Hypertension Sister   . Heart disease Sister   . Hypertension Sister   . Hypertension Sister     History  Substance Use Topics  . Smoking status: Never Smoker   . Smokeless tobacco: Never Used  . Alcohol Use: No    OB History    Grav Para Term Preterm Abortions TAB SAB Ect Mult Living                  Review of Systems  Constitutional: Negative for fever and chills.  HENT: Negative for ear pain, congestion and rhinorrhea.   Respiratory: Negative for cough, shortness of breath and wheezing.   Cardiovascular: Negative for chest pain.  Skin: Positive for itching and rash.    Allergies  Clonazepam  Home Medications   Current Outpatient Rx  Name Route Sig  Dispense Refill  . ASPIRIN 325 MG PO TABS Oral Take 325 mg by mouth daily.      . FUROSEMIDE 20 MG PO TABS Oral Take 1 tablet (20 mg total) by mouth daily. 30 tablet 11  . SERTRALINE HCL 25 MG PO TABS  Take once a day for generalized anxiety = "nerves" 30 tablet 11  . VITAMIN B-12 100 MCG PO TABS Oral Take 50 mcg by mouth daily.      [name] ZOLPIDEM TARTRATE 5 MG PO TABS Oral Take 1 tablet (5 mg total) by mouth at bedtime as needed for sleep. 30 tablet 5  . ESOMEPRAZOLE MAGNESIUM 40 MG PO CPDR Oral Take 1 capsule (40 mg total) by mouth daily before breakfast. 30 capsule 11  . TRIAMCINOLONE ACETONIDE 0.1 % EX CREA Topical Apply topically 2 (two) times daily. Apply bid prn 15 g 0    BP 189/91  Pulse 78  Temp(Src) 98.5 F (36.9 C) (Oral)  Resp 16  SpO2 98%  Physical Exam  Constitutional:  She appears well-developed and well-nourished. No distress.  HENT:  Head: Normocephalic and atraumatic.  Right Ear: Tympanic membrane, external ear and ear canal normal.  Left Ear: Tympanic membrane, external ear and ear canal normal.  Nose: Nose normal.  Mouth/Throat: Uvula is midline, oropharynx is clear and moist and mucous membranes are normal. No oropharyngeal exudate, posterior oropharyngeal edema or posterior oropharyngeal erythema.  Neck: Neck supple.  Cardiovascular: Normal rate, regular rhythm and normal heart sounds.   Pulmonary/Chest: Effort normal and breath sounds normal. No respiratory distress.  Lymphadenopathy:    She has no cervical adenopathy.  Neurological: She is alert.  Skin: Skin is warm and dry. Rash noted. No petechiae and no purpura noted. Rash is urticarial. Rash is not macular, not papular, not nodular, not pustular and not vesicular.     Psychiatric: She has a normal mood and affect.    ED Course  Procedures (including critical care time)  Labs Reviewed - No data to display No results found.   1. Urticaria       MDM          Melody Comas, PA 09/28/11 1219

## 2011-10-04 ENCOUNTER — Ambulatory Visit (INDEPENDENT_AMBULATORY_CARE_PROVIDER_SITE_OTHER): Payer: Medicare Other | Admitting: Internal Medicine

## 2011-10-04 ENCOUNTER — Encounter: Payer: Self-pay | Admitting: Internal Medicine

## 2011-10-04 DIAGNOSIS — L509 Urticaria, unspecified: Secondary | ICD-10-CM

## 2011-10-04 MED ORDER — PREDNISONE 10 MG PO TABS: 10.0000 mg | ORAL_TABLET | Freq: Every day | ORAL | Status: DC

## 2011-10-04 MED ORDER — LORATADINE 10 MG PO TABS: 10.0000 mg | ORAL_TABLET | Freq: Two times a day (BID) | ORAL | Status: DC

## 2011-10-04 MED ORDER — RANITIDINE HCL 150 MG PO TABS: 150.0000 mg | ORAL_TABLET | Freq: Two times a day (BID) | ORAL | Status: DC

## 2011-10-04 NOTE — Patient Instructions (Signed)
Itchy rash with whelps = urticarial rash that is intermittent. ON exam today skin looks normal. Plan - will refer to dermatology since 3 doctors have struck out. Take prednisone burst and taper to treat for any type of allergic reaction. Take  Claritin 10mg  twice a day and zantac 150 mg twice a day (both are generic and over the counter).    Hives Hives (urticaria) are itchy, red, swollen patches on the skin. They may change size, shape, and location quickly and repeatedly. Hives that occur deeper in the skin can cause swelling of the hands, feet, and face. Hives may be an allergic reaction to something you ate, touched, or put on your skin. Hives can also be a reaction to cold, heat, viral infections, medication, insect bites, or emotional stress. Often the cause is hard to find. Hives can come and go for several days to several weeks. Hives are not contagious. HOME CARE INSTRUCTIONS    If the cause of the hives is known, avoid exposure to that source.     To relieve itching and rash:     Apply cold compresses to the skin or take cool water baths. Do not take hot baths or showers because the warmth will make the itching worse.     The best medicine for hives is an antihistamine. An antihistamine will not cure hives, but it will reduce their severity. You can use an antihistamine available over the counter. This medicine may make you sleepy. You should not drive while using this medicine.     Take or give an antihistamine as directed by your pharmacist or caregiver.     Other medications may be prescribed for itching. Use these as directed.     You should wear loose fitting clothing, including undergarments, as skin irritations may make hives worse.     Follow-up as directed by your caregiver.  SEEK MEDICAL CARE IF:    You still have considerable itching after taking the medication (prescribed or purchased over the counter).     Joint swelling or pain occurs.     You are not improving or  are getting worse.  SEEK IMMEDIATE MEDICAL CARE IF:    You have a fever.     You notice any swelling of the lips, tongue, face or neck.     You have shortness of breath, difficulty breathing or swallowing, or tightness in the throat or chest.     Belly (abdominal) pain develops.     You are feeling very sick.     Your hives continue to spread.  These may be the first signs of a life-threatening allergic reaction. THIS IS AN EMERGENCY. Call 911 for medical help. Document Released: 11/22/2004 Document Revised: 06/27/2011 Document Reviewed: 11/09/2008 [facility] Patient Information 2012 [place], [place].

## 2011-10-04 NOTE — Progress Notes (Signed)
  Subjective:    Patient ID: Alexandria Beck, female    DOB: 1931-08-13, 77 y.o.   MRN: [id number]  HPI Alexandria Beck presents with a persistent, transient puritic rash. She was seen by Dr. Yetta Barre October 30th and she was started on hydrocortisone cream and atarax. She continued to have intermittent symptoms and was seen by Ms. Garnette Czech, PA at [facility] whose note indicates urticarial appearing rash. She was started on triamcinolone and claritin. She continue to have intermittent rash. She has not been able to identify any allergen. She reports that she will be awakened by itching and she will have large, 2-3 cm, whelps.  I have reviewed the patient's medical history in detail and updated the computerized patient record.    Review of Systems System review is negative for any constitutional, cardiac, pulmonary, GI or neuro symptoms or complaints other than as described in the HPI.     Objective:   Physical Exam Vitals normal Gen'l- heavyset AA woman in no distress Pulm - normal respirations Cor- RRR Derm - on exam of chest wall, thigh and buttock no urticarial rash or abnormality noted.       Assessment & Plan:

## 2011-10-04 NOTE — Assessment & Plan Note (Addendum)
Patient with 2 month history of pruritic urticarial rash LE, buttocks and chest failing topical steroids. No known allergen  Plan - Claritin 10 mg bid           Zantac 150 mg bid            Prednisone taper           Derm referral.

## 2011-10-05 NOTE — Telephone Encounter (Signed)
Pt has appointment with MD on 10/04/2011

## 2011-10-19 ENCOUNTER — Other Ambulatory Visit: Payer: Self-pay | Admitting: *Deleted

## 2011-10-19 MED ORDER — DIPHENHYDRAMINE HCL 50 MG PO TABS: 50.0000 mg | ORAL_TABLET | Freq: Four times a day (QID) | ORAL | Status: DC | PRN

## 2011-10-19 NOTE — Telephone Encounter (Signed)
done

## 2011-10-19 NOTE — Telephone Encounter (Signed)
Pt informed

## 2011-10-19 NOTE — Telephone Encounter (Signed)
Pt is requesting an rx for prescription strength Benadryl to help her with her itching sxs because she is unable to get in to see a dermatologist until the first of the year-MEN pt-please advise.

## 2011-11-17 ENCOUNTER — Encounter (HOSPITAL_COMMUNITY): Payer: Self-pay

## 2011-11-17 ENCOUNTER — Emergency Department (HOSPITAL_COMMUNITY)
Admission: EM | Admit: 2011-11-17 | Discharge: 2011-11-18 | Disposition: A | Discharge status: Home / Self Care | Payer: Medicare Other | Attending: Emergency Medicine | Admitting: Emergency Medicine

## 2011-11-17 DIAGNOSIS — R209 Unspecified disturbances of skin sensation: Secondary | ICD-10-CM | POA: Insufficient documentation

## 2011-11-17 DIAGNOSIS — Z79899 Other long term (current) drug therapy: Secondary | ICD-10-CM | POA: Insufficient documentation

## 2011-11-17 DIAGNOSIS — R259 Unspecified abnormal involuntary movements: Secondary | ICD-10-CM | POA: Insufficient documentation

## 2011-11-17 DIAGNOSIS — K219 Gastro-esophageal reflux disease without esophagitis: Secondary | ICD-10-CM | POA: Insufficient documentation

## 2011-11-17 DIAGNOSIS — I10 Essential (primary) hypertension: Secondary | ICD-10-CM | POA: Insufficient documentation

## 2011-11-17 DIAGNOSIS — R251 Tremor, unspecified: Secondary | ICD-10-CM

## 2011-11-17 NOTE — ED Notes (Signed)
ZOX:[id number]<AV> Expected date:11/17/11<BR> Expected time:10:58 PM<BR> Means of arrival:Ambulance<BR> Comments:<BR> 72yro female, sudden onset, tremors, Hx of n/v

## 2011-11-17 NOTE — ED Notes (Signed)
Per EMS, Pt from home.  Pt with visible tremors.  Denies pain, sob, n/v.  Pt with rapid respirations.  B/p 184/100, resp 26, sats 99% on room air.

## 2011-11-18 ENCOUNTER — Emergency Department (HOSPITAL_COMMUNITY): Payer: Medicare Other

## 2011-11-18 LAB — URINALYSIS, ROUTINE W REFLEX MICROSCOPIC
Glucose, UA: NEGATIVE mg/dL
Hgb urine dipstick: NEGATIVE
Ketones, ur: NEGATIVE mg/dL
Leukocytes, UA: NEGATIVE
Protein, ur: NEGATIVE mg/dL
Urobilinogen, UA: 0.2 mg/dL (ref 0.0–1.0)

## 2011-11-18 LAB — CBC
HCT: 36.7 % (ref 36.0–46.0)
Hemoglobin: 12.3 g/dL (ref 12.0–15.0)
MCH: 30 pg (ref 26.0–34.0)
MCHC: 33.5 g/dL (ref 30.0–36.0)
MCV: 89.5 fL (ref 78.0–100.0)
RDW: 12.4 % (ref 11.5–15.5)

## 2011-11-18 LAB — BASIC METABOLIC PANEL
Calcium: 9.7 mg/dL (ref 8.4–10.5)
Creatinine, Ser: 0.85 mg/dL (ref 0.50–1.10)
GFR calc non Af Amer: 64 mL/min — ABNORMAL LOW (ref >90)
Sodium: 141 mEq/L (ref 135–145)

## 2011-11-18 LAB — DIFFERENTIAL
Basophils Absolute: 0 10*3/uL (ref 0.0–0.1)
Eosinophils Absolute: 0.2 10*3/uL (ref 0.0–0.7)
Eosinophils Relative: 3 % (ref 0–5)
Monocytes Absolute: 0.5 10*3/uL (ref 0.1–1.0)

## 2011-11-18 NOTE — ED Provider Notes (Signed)
Medical screening examination/treatment/procedure(s) were performed by non-physician practitioner and as supervising physician I was immediately available for consultation/collaboration.  Babbette Dalesandro P Fayetta Sorenson, MD 11/18/11 0644 

## 2011-11-18 NOTE — ED Provider Notes (Signed)
History     CSN: [id number]  Arrival date & time 11/17/11  2259   First MD Initiated Contact with Patient 11/17/11 2351      Chief Complaint  Patient presents with  . Tremors    (Consider location/radiation/quality/duration/timing/severity/associated sxs/prior treatment) HPI Comments: Patient here with an hour episode of tremors and shaking - patient states had been doing well until this evening when this occurred.  States started with head shaking and mouth trembling, states then involved both arms and legs, this was witnessed by the patient's daughter, states also had numbness to her fingertips and felt anxious.  States had a similar event in October, was admitted for a TIA work up which was negative and sent home.  Of note, review of the patient's chart also reveals a history of anxiety.  Patient is a 78 y.o. female presenting with neurologic complaint. The history is provided by the patient. No language interpreter was used.  Neurologic Problem Primary symptoms do not include headaches, syncope, loss of consciousness, altered mental status, seizures, dizziness, visual change, paresthesias, focal weakness, loss of sensation, speech change, memory loss, fever, nausea or vomiting. The symptoms began 1 to 2 hours ago. The episode lasted 1 hour. The symptoms are resolved. The neurological symptoms are diffuse.  Additional symptoms include anxiety. Additional symptoms do not include neck stiffness, weakness, pain, lower back pain, leg pain, loss of balance, photophobia, aura, hallucinations, nystagmus, taste disturbance, hyperacusis, hearing loss, tinnitus, vertigo, irritability or dysphoric mood. Medical issues do not include seizures, cerebral vascular accident, alcohol use, drug use, hypertension or recent surgery. Workup history includes MRI, CT scan, EEG, carotid ultrasound and cardiac workup. Procedure history comments: On 07/2011.    Past Medical History  Diagnosis Date  . Near syncope      diagnosed as anxiety related. gets some relief with clonazepam. No formal testing done.  [name] GERD (gastroesophageal reflux disease)     has had EGD 2006-negative  . Allergy     seasonal  . Irregular heart beat     no testing, holter, etc. Takes no medication  . Hypertension      On no medication. Home readings SBP=150's    Past Surgical History  Procedure Date  . Abdominal hysterectomy 1974    fibroid tumors w/ metorrhagia  . Tonsillectomy   . G4p4 - nsvd  midwife delivery for two     Family History  Problem Relation Age of Onset  . Heart disease Mother   . Stroke Mother   . Hypertension Mother   . Cancer Father     lung cancer - smoker  . Heart disease Sister     MI  . Kidney disease Sister     end stage renal disease on HD  . Diabetes Sister   . Heart disease Brother   . Diabetes Sister   . Hypertension Sister   . Kidney disease Sister   . Cancer Sister   . Diabetes Sister   . Kidney disease Sister     HD  . Hypertension Sister   . Heart disease Sister   . Hypertension Sister   . Heart disease Sister   . Hypertension Sister   . Hypertension Sister     History  Substance Use Topics  . Smoking status: Never Smoker   . Smokeless tobacco: Never Used  . Alcohol Use: No    OB History    Grav Para Term Preterm Abortions TAB SAB Ect Mult Living  Review of Systems  Constitutional: Negative for fever and irritability.  HENT: Negative for hearing loss, neck stiffness and tinnitus.   Eyes: Negative for photophobia.  Cardiovascular: Negative for syncope.  Gastrointestinal: Negative for nausea and vomiting.  Neurological: Negative for dizziness, vertigo, speech change, focal weakness, seizures, loss of consciousness, weakness, headaches, paresthesias and loss of balance.  Psychiatric/Behavioral: Negative for hallucinations, memory loss, dysphoric mood and altered mental status.  All other systems reviewed and are negative.    Allergies    Clonazepam and Aspirin  Home Medications   Current Outpatient Rx  Name Route Sig Dispense Refill  . DIPHENHYDRAMINE HCL 50 MG PO TABS Oral Take 1 tablet (50 mg total) by mouth every 6 (six) hours as needed for itching. 100 tablet 11  . ESOMEPRAZOLE MAGNESIUM 40 MG PO CPDR Oral Take 1 capsule (40 mg total) by mouth daily before breakfast. 30 capsule 11  . FUROSEMIDE 20 MG PO TABS Oral Take 1 tablet (20 mg total) by mouth daily. 30 tablet 11  . LORATADINE 10 MG PO TABS Oral Take 1 tablet (10 mg total) by mouth 2 (two) times daily. 60 tablet 2  . SERTRALINE HCL 25 MG PO TABS  Take once a day for generalized anxiety = "nerves" 30 tablet 11  . ZOLPIDEM TARTRATE 5 MG PO TABS Oral Take 1 tablet (5 mg total) by mouth at bedtime as needed for sleep. 30 tablet 5    BP 163/98  Pulse 74  Temp(Src) 97.9 F (36.6 C) (Oral)  Resp 19  Ht 5' 1.5" (1.562 m)  Wt 163 lb (73.936 kg)  BMI 30.30 kg/m2  SpO2 100%  Physical Exam  Nursing note and vitals reviewed. Constitutional: She is oriented to person, place, and time. She appears well-developed and well-nourished. No distress.  HENT:  Head: Normocephalic and atraumatic.  Right Ear: External ear normal.  Left Ear: External ear normal.  Nose: Nose normal.  Mouth/Throat: Oropharynx is clear and moist. No oropharyngeal exudate.  Eyes: Conjunctivae are normal. Pupils are equal, round, and reactive to light. No scleral icterus.  Neck: Normal range of motion. Neck supple.  Cardiovascular: Normal rate, regular rhythm and normal heart sounds.  Exam reveals no gallop and no friction rub.   No murmur heard. Pulmonary/Chest: Effort normal and breath sounds normal. She exhibits no tenderness.  Abdominal: Soft. Bowel sounds are normal. She exhibits no distension. There is no tenderness.  Musculoskeletal: Normal range of motion. She exhibits no edema and no tenderness.  Lymphadenopathy:    She has no cervical adenopathy.  Neurological: She is alert and  oriented to person, place, and time. She has normal strength and normal reflexes. She displays no tremor. No cranial nerve deficit or sensory deficit. She exhibits normal muscle tone. She displays no seizure activity. Coordination normal. GCS eye subscore is 4. GCS verbal subscore is 5. GCS motor subscore is 6.       No ataxia to gait, no intentional tremor noted.  Skin: Skin is warm and dry.  Psychiatric: She has a normal mood and affect. Her behavior is normal. Judgment and thought content normal.    ED Course  Procedures (including critical care time)  Labs Reviewed  BASIC METABOLIC PANEL - Abnormal; Notable for the following:    Glucose, Bld 110 (*)    GFR calc non Af Amer 64 (*)    GFR calc Af Amer 74 (*)    All other components within normal limits  CBC  DIFFERENTIAL  URINALYSIS, ROUTINE  W REFLEX MICROSCOPIC   Ct Head Wo Contrast  11/18/2011  *RADIOLOGY REPORT*  Clinical Data: Tremors.  CT HEAD WITHOUT CONTRAST  Technique:  Contiguous axial images were obtained from the base of the skull through the vertex without contrast.  Comparison: MRI 08/09/2011  Findings: Mild cerebral atrophy. No acute intracranial abnormality. Specifically, no hemorrhage, hydrocephalus, mass lesion, acute infarction, or significant intracranial injury.  No acute calvarial abnormality. Visualized paranasal sinuses and mastoids clear. Orbital soft tissues unremarkable.  IMPRESSION: Mild atrophy. No acute intracranial abnormality.  Original Report Authenticated By: Cyndie Chime, M.D.   Results for orders placed during the hospital encounter of 11/17/11  CBC      Component Value Range   WBC 7.9  4.0 - 10.5 (K/uL)   RBC 4.10  3.87 - 5.11 (MIL/uL)   Hemoglobin 12.3  12.0 - 15.0 (g/dL)   HCT 62.1  30.8 - 65.7 (%)   MCV 89.5  78.0 - 100.0 (fL)   MCH 30.0  26.0 - 34.0 (pg)   MCHC 33.5  30.0 - 36.0 (g/dL)   RDW 84.6  96.2 - 95.2 (%)   Platelets 313  150 - 400 (K/uL)  DIFFERENTIAL      Component Value Range     Neutrophils Relative 48  43 - 77 (%)   Neutro Abs 3.8  1.7 - 7.7 (K/uL)   Lymphocytes Relative 42  12 - 46 (%)   Lymphs Abs 3.3  0.7 - 4.0 (K/uL)   Monocytes Relative 7  3 - 12 (%)   Monocytes Absolute 0.5  0.1 - 1.0 (K/uL)   Eosinophils Relative 3  0 - 5 (%)   Eosinophils Absolute 0.2  0.0 - 0.7 (K/uL)   Basophils Relative 0  0 - 1 (%)   Basophils Absolute 0.0  0.0 - 0.1 (K/uL)  BASIC METABOLIC PANEL      Component Value Range   Sodium 141  135 - 145 (mEq/L)   Potassium 3.8  3.5 - 5.1 (mEq/L)   Chloride 105  96 - 112 (mEq/L)   CO2 25  19 - 32 (mEq/L)   Glucose, Bld 110 (*) 70 - 99 (mg/dL)   BUN 11  6 - 23 (mg/dL)   Creatinine, Ser 8.41  0.50 - 1.10 (mg/dL)   Calcium 9.7  8.4 - 32.4 (mg/dL)   GFR calc non Af Amer 64 (*) >90 (mL/min)   GFR calc Af Amer 74 (*) >90 (mL/min)  URINALYSIS, ROUTINE W REFLEX MICROSCOPIC      Component Value Range   Color, Urine YELLOW  YELLOW    APPearance CLEAR  CLEAR    Specific Gravity, Urine 1.006  1.005 - 1.030    pH 6.5  5.0 - 8.0    Glucose, UA NEGATIVE  NEGATIVE (mg/dL)   Hgb urine dipstick NEGATIVE  NEGATIVE    Bilirubin Urine NEGATIVE  NEGATIVE    Ketones, ur NEGATIVE  NEGATIVE (mg/dL)   Protein, ur NEGATIVE  NEGATIVE (mg/dL)   Urobilinogen, UA 0.2  0.0 - 1.0 (mg/dL)   Nitrite NEGATIVE  NEGATIVE    Leukocytes, UA NEGATIVE  NEGATIVE    Ct Head Wo Contrast  11/18/2011  *RADIOLOGY REPORT*  Clinical Data: Tremors.  CT HEAD WITHOUT CONTRAST  Technique:  Contiguous axial images were obtained from the base of the skull through the vertex without contrast.  Comparison: MRI 08/09/2011  Findings: Mild cerebral atrophy. No acute intracranial abnormality. Specifically, no hemorrhage, hydrocephalus, mass lesion, acute infarction, or significant intracranial injury.  No acute calvarial abnormality. Visualized paranasal sinuses and mastoids clear. Orbital soft tissues unremarkable.  IMPRESSION: Mild atrophy. No acute intracranial abnormality.  Original  Report Authenticated By: Cyndie Chime, M.D.     Tremor    MDM  Patient presents with a tranisient episode of tremor.  She recently had a TIA/CVA work up including MRI/MRA of the brain, carotid US, which revealed no abnormalities.  I did not witness any of the tremors which she reports resolved upon arrival here.  Labs and CT scan do not show any focal cause, I am not concerned with an emergent neurological condition at this time and will discharge the patient for follow up with Dr. Debby Bud, her PCP.        Izola Price [place], [place] 11/18/11 [phone]

## 2011-12-04 ENCOUNTER — Encounter: Payer: Self-pay | Admitting: Gastroenterology

## 2011-12-24 ENCOUNTER — Encounter: Payer: Self-pay | Admitting: Gastroenterology

## 2011-12-24 ENCOUNTER — Ambulatory Visit (INDEPENDENT_AMBULATORY_CARE_PROVIDER_SITE_OTHER): Payer: Medicare Other | Admitting: Gastroenterology

## 2011-12-24 DIAGNOSIS — R1013 Epigastric pain: Secondary | ICD-10-CM

## 2011-12-24 DIAGNOSIS — R1310 Dysphagia, unspecified: Secondary | ICD-10-CM | POA: Insufficient documentation

## 2011-12-24 DIAGNOSIS — K219 Gastro-esophageal reflux disease without esophagitis: Secondary | ICD-10-CM

## 2011-12-24 NOTE — Patient Instructions (Signed)
Upper GI Endoscopy Upper GI endoscopy means using a flexible scope to look at the esophagus, stomach, and upper small bowel. This is done to make a diagnosis in people with heartburn, abdominal pain, or abnormal bleeding. Sometimes an endoscope is needed to remove foreign bodies or food that become stuck in the esophagus; it can also be used to take biopsy samples. For the best results, do not eat or drink for 8 hours before having your upper endoscopy.  To perform the endoscopy, you will probably be sedated and your throat will be numbed with a special spray. The endoscope is then slowly passed down your throat (this will not interfere with your breathing). An endoscopy exam takes 15 to 30 minutes to complete and there is no real pain. Patients rarely remember much about the procedure. The results of the test may take several days if a biopsy or other test is taken.  You may have a sore throat after an endoscopy exam. Serious complications are very rare. Stick to liquids and soft foods until your pain is better. Do not drive a car or operate any dangerous equipment for at least 24 hours after being sedated. SEEK IMMEDIATE MEDICAL CARE IF:   You have severe throat pain.   You have shortness of breath.   You have bleeding problems.   You have a fever.   You have difficulty recovering from your sedation.  Document Released: 11/22/2004 Document Revised: 06/27/2011 Document Reviewed: 10/17/2008 Exit[employer] Patient Information 2012 [employer]. 

## 2011-12-24 NOTE — Assessment & Plan Note (Addendum)
Abdominal pain may be related to ulcer or non-ulcer dyspepsia. She is taking Aleve regularly which may be causing symptoms.  Recommendations #1 upper endoscopy #2 to consider holding NSAIDs

## 2011-12-24 NOTE — Progress Notes (Signed)
History of Present Illness:  Alexandria Beck is a pleasant 78 year old Afro-American female patient of Dr. Debby Bud here for evaluation of dysphagia and abdominal discomfort. She has mild dysphagia and odynophagia to solids. She also complains of immediate postprandial discomfort in her upper abdomen which is relieved with belching. She takes Nexium. She denies nausea. She takes Aleve several times a week.    Past Medical History  Diagnosis Date  . Near syncope     diagnosed as anxiety related. gets some relief with clonazepam. No formal testing done.  [name] GERD (gastroesophageal reflux disease)     has had EGD 2006-negative  . Allergy     seasonal  . Irregular heart beat     no testing, holter, etc. Takes no medication  . Hypertension      On no medication. Home readings SBP=150's  . Diverticulosis     2004  . Internal hemorrhoids   . Esophageal stricture    Past Surgical History  Procedure Date  . Abdominal hysterectomy 1974    fibroid tumors w/ metorrhagia  . Tonsillectomy   . G66p4 - nsvd  midwife delivery for two    family history includes Cancer in her father and sister; Diabetes in her sisters; Heart disease in her brother, mother, and sisters; Hypertension in her mother and sisters; Kidney disease in her sisters; and Stroke in her mother. Current Outpatient Prescriptions  Medication Sig Dispense Refill  . diphenhydrAMINE (BENADRYL) 25 mg capsule Take 25 mg by mouth every 6 (six) hours as needed.      [name] esomeprazole (NEXIUM) 40 MG capsule Take 1 capsule (40 mg total) by mouth daily before breakfast.  30 capsule  11  . hydrOXYzine (ATARAX/VISTARIL) 25 MG tablet Take 25 mg by mouth at bedtime as needed.      . sertraline (ZOLOFT) 25 MG tablet Take once a day for generalized anxiety = "nerves"  30 tablet  11  . zolpidem (AMBIEN) 5 MG tablet Take 1 tablet (5 mg total) by mouth at bedtime as needed for sleep.  30 tablet  5   Allergies as of 12/24/2011 - Review Complete 12/24/2011    Allergen Reaction Noted  . Clonazepam Swelling 08/19/2011  . Aspirin  11/17/2011    reports that she has never smoked. She has never used smokeless tobacco. She reports that she does not drink alcohol or use illicit drugs.     Review of Systems: Pertinent positive and negative review of systems were noted in the above HPI section. All other review of systems were otherwise negative.  Vital signs were reviewed in today's medical record Physical Exam: General: Well developed , well nourished, no acute distress Head: Normocephalic and atraumatic Eyes:  sclerae anicteric, EOMI Ears: Normal auditory acuity Mouth: No deformity or lesions Neck: Supple, no masses or thyromegaly Lungs: Clear throughout to auscultation Heart: Regular rate and rhythm; no murmurs, rubs or bruits Abdomen: Soft, non tender and non distended. No masses, hepatosplenomegaly or hernias noted. Normal Bowel sounds Rectal:deferred Musculoskeletal: Symmetrical with no gross deformities  Skin: No lesions on visible extremities Pulses:  Normal pulses noted Extremities: No clubbing, cyanosis, edema or deformities noted Neurological: Alert oriented x 4, grossly nonfocal Cervical Nodes:  No significant cervical adenopathy Inguinal Nodes: No significant inguinal adenopathy Psychological:  Alert and cooperative. Normal mood and affect

## 2011-12-24 NOTE — Assessment & Plan Note (Addendum)
Rule out esophageal stricture  Recommendations #1 upper endoscopy with dilatation as indicated 

## 2011-12-24 NOTE — Assessment & Plan Note (Signed)
Continue Nexium 

## 2011-12-25 ENCOUNTER — Encounter: Payer: Self-pay | Admitting: Gastroenterology

## 2011-12-25 ENCOUNTER — Ambulatory Visit (AMBULATORY_SURGERY_CENTER): Payer: Medicare Other | Admitting: Gastroenterology

## 2011-12-25 DIAGNOSIS — R1013 Epigastric pain: Secondary | ICD-10-CM

## 2011-12-25 DIAGNOSIS — R1310 Dysphagia, unspecified: Secondary | ICD-10-CM

## 2011-12-25 DIAGNOSIS — K219 Gastro-esophageal reflux disease without esophagitis: Secondary | ICD-10-CM

## 2011-12-25 DIAGNOSIS — K222 Esophageal obstruction: Secondary | ICD-10-CM

## 2011-12-25 MED ORDER — SODIUM CHLORIDE 0.9 % IV SOLN: 500.0000 mL | INTRAVENOUS | Status: DC

## 2011-12-25 NOTE — Op Note (Signed)
[facility] [address] [place], [place]  [address]  ENDOSCOPY PROCEDURE REPORT  PATIENT:  Alexandria, Beck  MR#:  [id number] BIRTHDATE:  1931-08-01, 80 yrs. old  GENDER:  female  ENDOSCOPIST:  Barbette Hair. Arlyce Dice, MD Referred by:  PROCEDURE DATE:  12/25/2011 PROCEDURE:  EGD, diagnostic 43235, Maloney Dilation of Esophagus ASA CLASS:  Class II INDICATIONS:  abdominal pain, dysphagia  MEDICATIONS:   MAC sedation, administered by CRNA propofol 100mg IV, glycopyrrolate (Robinal) 0.2 mg IV, 0.6cc simethancone 0.6 cc PO TOPICAL ANESTHETIC:  DESCRIPTION OF PROCEDURE:   After the risks and benefits of the procedure were explained, informed consent was obtained.  The [facility] [id number] endoscope was introduced through the mouth and advanced to the third portion of the duodenum.  The instrument was slowly withdrawn as the mucosa was fully examined. <<PROCEDUREIMAGES>>  A stricture was found at the gastroesophageal junction (see image4). Early esophageal stricture Dilation with maloney dilator 18mm Minimal resistance; no heme  Otherwise the examination was normal (see image2 and image3).    Retroflexed views revealed no abnormalities.    The scope was then withdrawn from the patient and the procedure completed.  COMPLICATIONS:  None  ENDOSCOPIC IMPRESSION: 1) Stricture at the gastroesophageal junction - s/p maloney dilitation 2) Otherwise normal examination RECOMMENDATIONS: 1) dilatations PRN 2) Call office next 2-3 days to schedule an office appointment for 4 weeks  ______________________________ Barbette Hair. Arlyce Dice, MD  CC:  Jacques Navy, MD  n. Rosalie DoctorBarbette Hair. Samuele Storey at 12/25/2011 04:24 PM  Wylene Simmer, [phone]

## 2011-12-25 NOTE — Progress Notes (Signed)
Patient did not experience any of the following events: a burn prior to discharge; a fall within the facility; wrong site/side/patient/procedure/implant event; or a hospital transfer or hospital admission upon discharge from the facility. (G8907) Patient did not have preoperative order for IV antibiotic SSI prophylaxis. (G8918)  

## 2011-12-25 NOTE — Patient Instructions (Signed)
YOU HAD AN ENDOSCOPIC PROCEDURE TODAY AT THE [facility]: Refer to the procedure report that was given to you for any specific questions about what was found during the examination.  If the procedure report does not answer your questions, please call your gastroenterologist to clarify.  If you requested that your care partner not be given the details of your procedure findings, then the procedure report has been included in a sealed envelope for you to review at your convenience later.  YOU SHOULD EXPECT: Some feelings of bloating in the abdomen. Passage of more gas than usual.  Walking can help get rid of the air that was put into your GI tract during the procedure and reduce the bloating. If you had a lower endoscopy (such as a colonoscopy or flexible sigmoidoscopy) you may notice spotting of blood in your stool or on the toilet paper. If you underwent a bowel prep for your procedure, then you may not have a normal bowel movement for a few days.  DIET: Your first meal following the procedure should be a light meal and then it is ok to progress to your normal diet.  A half-sandwich or bowl of soup is an example of a good first meal.  Heavy or fried foods are harder to digest and may make you feel nauseous or bloated.  Likewise meals heavy in dairy and vegetables can cause extra gas to form and this can also increase the bloating.  Drink plenty of fluids but you should avoid alcoholic beverages for 24 hours.  PLEASE FOLLOW THE ESOPHAGEAL DILATION DIET AS FOLLOWS: -NOTHING BY MOUTH UNTIL 5:30PM -CLEAR LIQUIDS FOR 1 HOUR 5:30PM-6:30PM -SOFT FOODS FOR THE REST OF TODAY 6:30PM UNTIL TOMORROW  ACTIVITY: Your care partner should take you home directly after the procedure.  You should plan to take it easy, moving slowly for the rest of the day.  You can resume normal activity the day after the procedure however you should NOT DRIVE or use heavy machinery for 24 hours (because of the sedation  medicines used during the test).    SYMPTOMS TO REPORT IMMEDIATELY: A gastroenterologist can be reached at any hour.  During normal business hours, 8:30 AM to 5:00 PM Monday through Friday, call [phone].  After hours and on weekends, please call the GI answering service at [phone] who will take a message and have the physician on call contact you.   Following upper endoscopy (EGD)  Vomiting of blood or coffee ground material  New chest pain or pain under the shoulder blades  Painful or persistently difficult swallowing  New shortness of breath  Fever of 100F or higher  Black, tarry-looking stools  FOLLOW UP: If any biopsies were taken you will be contacted by phone or by letter within the next 1-3 weeks.  Call your gastroenterologist if you have not heard about the biopsies in 3 weeks.  Our staff will call the home number listed on your records the next business day following your procedure to check on you and address any questions or concerns that you may have at that time regarding the information given to you following your procedure. This is a courtesy call and so if there is no answer at the home number and we have not heard from you through the emergency physician on call, we will assume that you have returned to your regular daily activities without incident.  SIGNATURES/CONFIDENTIALITY: You and/or your care partner have signed paperwork which will be entered into  your electronic medical record.  These signatures attest to the fact that that the information above on your After Visit Summary has been reviewed and is understood.  Full responsibility of the confidentiality of this discharge information lies with you and/or your care-partner.

## 2011-12-26 ENCOUNTER — Telehealth: Payer: Self-pay | Admitting: *Deleted

## 2011-12-26 NOTE — Telephone Encounter (Signed)
  Follow up Call-  Call back number 12/25/2011  Post procedure Call Back phone  # [phone]  Permission to leave phone message Yes     Patient questions:  Do you have a fever, pain , or abdominal swelling? no Pain Score  0 *  Have you tolerated food without any problems? yes  Have you been able to return to your normal activities? yes  Do you have any questions about your discharge instructions: Diet   no Medications  no Follow up visit  no  Do you have questions or concerns about your Care? no  Actions: * If pain score is 4 or above: No action needed, pain <4.

## 2011-12-28 ENCOUNTER — Telehealth: Payer: Self-pay | Admitting: Gastroenterology

## 2011-12-28 NOTE — Telephone Encounter (Signed)
Pt states she is not having any problems with dysphagia, chest pain or SOB. States she is just having burning. Instructed pt to take the Nexium BID per Dr. Lauro Franklin recommendations. Pt is aware and knows to call if no improvement.

## 2011-12-28 NOTE — Telephone Encounter (Signed)
I see that she had a dilation 3 days ago. Ensure that she is not having worsening dysphagia, chest pain, shortness of breath, et Karie Soda. Please be sure she is eating and drinking okay. If all is okay, it would be reasonable to try to increase her Nexium to twice a day.  This should be taken 30 minutes to one hour before breakfast and before her last meal of the day. She should be advised to remain upright 30 minutes to one hour after meals and not eating within 2 hours of bedtime. She can follow with Dr. Arlyce Dice as he requested in 4 weeks She can call us if this isn't helping

## 2011-12-28 NOTE — Telephone Encounter (Signed)
Alexandria Beck pt last office visit 12/24/11. Had EGD with dil 12/25/11. Pt states she is taking Nexium 40mg  daily but is still complaining of burning in the top of her throat and in the top of her stomach. Dr. Rhea Belton as doc of the day please advise.

## 2012-01-21 ENCOUNTER — Emergency Department (HOSPITAL_COMMUNITY)
Admission: EM | Admit: 2012-01-21 | Discharge: 2012-01-21 | Disposition: A | Discharge status: Home / Self Care | Payer: Medicare Other | Source: Home / Self Care | Attending: Family Medicine | Admitting: Family Medicine

## 2012-01-21 ENCOUNTER — Encounter (HOSPITAL_COMMUNITY): Payer: Self-pay

## 2012-01-21 DIAGNOSIS — I83812 Varicose veins of left lower extremities with pain: Secondary | ICD-10-CM

## 2012-01-21 DIAGNOSIS — I83893 Varicose veins of bilateral lower extremities with other complications: Secondary | ICD-10-CM

## 2012-01-21 NOTE — ED Provider Notes (Signed)
History     CSN: [id number]  Arrival date & time 01/21/12  1247   First MD Initiated Contact with Patient 01/21/12 1407      Chief Complaint  Patient presents with  . Leg Pain    (Consider location/radiation/quality/duration/timing/severity/associated sxs/prior treatment) Patient is a 78 y.o. female presenting with leg pain. The history is provided by the patient.  Leg Pain  The incident occurred more than 1 week ago. The incident occurred at home. There was no injury mechanism. The pain is present in the left leg. The pain is mild. Associated symptoms comments: Palpable vein swelling to lat calf area, , has been to vein center in the past..    Past Medical History  Diagnosis Date  . Near syncope     diagnosed as anxiety related. gets some relief with clonazepam. No formal testing done.  [name] GERD (gastroesophageal reflux disease)     has had EGD 2006-negative  . Allergy     seasonal  . Irregular heart beat     no testing, holter, etc. Takes no medication  . Hypertension      On no medication. Home readings SBP=150's  . Diverticulosis     2004  . Internal hemorrhoids   . Esophageal stricture     Past Surgical History  Procedure Date  . Abdominal hysterectomy 1974    fibroid tumors w/ metorrhagia  . Tonsillectomy   . G4p4 - nsvd  midwife delivery for two   . Tonsillectomy     Family History  Problem Relation Age of Onset  . Heart disease Mother   . Stroke Mother   . Hypertension Mother   . Cancer Father     lung cancer - smoker  . Heart disease Sister     MI  . Kidney disease Sister     end stage renal disease on HD  . Diabetes Sister   . Heart disease Brother   . Diabetes Sister   . Hypertension Sister   . Kidney disease Sister   . Cancer Sister   . Diabetes Sister   . Kidney disease Sister     HD  . Hypertension Sister   . Heart disease Sister   . Hypertension Sister   . Heart disease Sister   . Hypertension Sister   . Hypertension Sister      History  Substance Use Topics  . Smoking status: Never Smoker   . Smokeless tobacco: Never Used  . Alcohol Use: No    OB History    Grav Para Term Preterm Abortions TAB SAB Ect Mult Living                  Review of Systems  Constitutional: Negative.   Musculoskeletal: Negative.   Skin: Negative.     Allergies  Clonazepam and Aspirin  Home Medications   Current Outpatient Rx  Name Route Sig Dispense Refill  . DIPHENHYDRAMINE HCL 25 MG PO CAPS Oral Take 25 mg by mouth every 6 (six) hours as needed.    [name] DIPHENHYDRAMINE HCL 50 MG PO TABS Oral Take 1 tablet (50 mg total) by mouth every 6 (six) hours as needed for itching. 100 tablet 11  . ESOMEPRAZOLE MAGNESIUM 40 MG PO CPDR Oral Take 1 capsule (40 mg total) by mouth daily before breakfast. 30 capsule 11  . HYDROXYZINE HCL 25 MG PO TABS Oral Take 25 mg by mouth at bedtime as needed.    . SERTRALINE HCL 25 MG PO TABS  Take once a day for generalized anxiety = "nerves" 30 tablet 11  . ZOLPIDEM TARTRATE 5 MG PO TABS Oral Take 1 tablet (5 mg total) by mouth at bedtime as needed for sleep. 30 tablet 5    BP 161/86  Pulse 82  Temp(Src) 98.5 F (36.9 C) (Oral)  Resp 18  SpO2 100%  Physical Exam  Nursing note and vitals reviewed. Constitutional: She is oriented to person, place, and time. She appears well-developed and well-nourished.  Musculoskeletal: She exhibits no edema and no tenderness.       Left lower leg: She exhibits tenderness. She exhibits no swelling and no edema.       Legs: Neurological: She is alert and oriented to person, place, and time.  Skin: Skin is warm and dry.    ED Course  Procedures (including critical care time)  Labs Reviewed - No data to display No results found.   1. Varicose veins of left lower extremity with pain       MDM          Linna Hoff, MD 01/24/12 2204

## 2012-01-21 NOTE — Discharge Instructions (Signed)
Wear a support stocking on left leg, use heat and advil and elevate as needed. Go to vein center if further problems.

## 2012-01-21 NOTE — ED Notes (Signed)
Pt states there is a vein that runs from lt lateral foot to half-way up lt lateral calf and when it becomes visible she experiences pain.  States it has been bothering her for about 2 weeks intermittently

## 2012-01-23 ENCOUNTER — Encounter: Payer: Self-pay | Admitting: Gastroenterology

## 2012-01-23 ENCOUNTER — Ambulatory Visit (INDEPENDENT_AMBULATORY_CARE_PROVIDER_SITE_OTHER): Payer: Medicare Other | Admitting: Gastroenterology

## 2012-01-23 VITALS — BP 100/60 | HR 80 | Wt 168.4 lb

## 2012-01-23 DIAGNOSIS — R1310 Dysphagia, unspecified: Secondary | ICD-10-CM

## 2012-01-23 DIAGNOSIS — R1013 Epigastric pain: Secondary | ICD-10-CM

## 2012-01-23 NOTE — Assessment & Plan Note (Signed)
Improved following esophageal dilatation 

## 2012-01-23 NOTE — Progress Notes (Signed)
History of Present Illness:  Mrs. Bordeau has returned following upper endoscopy with dilatation of a distal esophageal stricture. Dysphagia is significantly improved. She has occasional slight fullness in her upper abdomen postprandially. She denies pyrosis.    Review of Systems: Pertinent positive and negative review of systems were noted in the above HPI section. All other review of systems were otherwise negative.    Current Medications, Allergies, Past Medical History, Past Surgical History, Family History and Social History were reviewed in [employer] [facility] electronic medical record  Vital signs were reviewed in today's medical record. Physical Exam: General: Well developed , well nourished, no acute distress

## 2012-01-23 NOTE — Patient Instructions (Signed)
Follow up as needed

## 2012-01-23 NOTE — Assessment & Plan Note (Signed)
Resolved

## 2012-02-12 ENCOUNTER — Telehealth: Payer: Self-pay

## 2012-02-12 NOTE — Telephone Encounter (Signed)
Rx refill request received for Ambien 5 mg 1 po qhs, last refilled 01/02/2012, please advise in Dr Debby Bud' absence.

## 2012-02-12 NOTE — Telephone Encounter (Signed)
OK to fill this prescription with additional refills x0 OV w/Dr Norins Thank you!  

## 2012-02-14 MED ORDER — ZOLPIDEM TARTRATE 5 MG PO TABS: 5.0000 mg | ORAL_TABLET | Freq: Every evening | ORAL | Status: DC | PRN

## 2012-02-14 NOTE — Telephone Encounter (Signed)
Done

## 2012-02-25 ENCOUNTER — Telehealth: Payer: Self-pay | Admitting: Internal Medicine

## 2012-02-25 NOTE — Telephone Encounter (Signed)
Received copies from [facility] ,on 02/25/12. Forwarded 4 pages to Dr. Debby Bud ,for review.

## 2012-06-06 ENCOUNTER — Observation Stay (HOSPITAL_COMMUNITY)
Admission: EM | Admit: 2012-06-06 | Discharge: 2012-06-06 | Disposition: A | Discharge status: Home / Self Care | Payer: Medicare Other | Attending: Emergency Medicine | Admitting: Emergency Medicine

## 2012-06-06 ENCOUNTER — Observation Stay (HOSPITAL_COMMUNITY): Payer: Medicare Other

## 2012-06-06 ENCOUNTER — Emergency Department (HOSPITAL_COMMUNITY): Payer: Medicare Other

## 2012-06-06 ENCOUNTER — Encounter (HOSPITAL_COMMUNITY): Payer: Self-pay | Admitting: Emergency Medicine

## 2012-06-06 DIAGNOSIS — R209 Unspecified disturbances of skin sensation: Principal | ICD-10-CM | POA: Insufficient documentation

## 2012-06-06 DIAGNOSIS — I517 Cardiomegaly: Secondary | ICD-10-CM

## 2012-06-06 DIAGNOSIS — R200 Anesthesia of skin: Secondary | ICD-10-CM

## 2012-06-06 DIAGNOSIS — G459 Transient cerebral ischemic attack, unspecified: Secondary | ICD-10-CM

## 2012-06-06 LAB — LIPID PANEL
Cholesterol: 216 mg/dL — ABNORMAL HIGH (ref 0–200)
VLDL: 18 mg/dL (ref 0–40)

## 2012-06-06 LAB — BASIC METABOLIC PANEL
CO2: 25 mEq/L (ref 19–32)
Calcium: 9.4 mg/dL (ref 8.4–10.5)
Creatinine, Ser: 0.74 mg/dL (ref 0.50–1.10)
GFR calc non Af Amer: 79 mL/min — ABNORMAL LOW (ref >90)
Glucose, Bld: 100 mg/dL — ABNORMAL HIGH (ref 70–99)
Sodium: 139 mEq/L (ref 135–145)

## 2012-06-06 LAB — URINALYSIS, ROUTINE W REFLEX MICROSCOPIC
Protein, ur: NEGATIVE mg/dL
Urobilinogen, UA: 1 mg/dL (ref 0.0–1.0)

## 2012-06-06 LAB — CBC WITH DIFFERENTIAL/PLATELET
Basophils Absolute: 0 10*3/uL (ref 0.0–0.1)
Basophils Relative: 0 % (ref 0–1)
Eosinophils Absolute: 0.3 10*3/uL (ref 0.0–0.7)
Eosinophils Relative: 5 % (ref 0–5)
MCH: 30.1 pg (ref 26.0–34.0)
MCV: 89.3 fL (ref 78.0–100.0)
Neutrophils Relative %: 45 % (ref 43–77)
Platelets: 307 10*3/uL (ref 150–400)
RBC: 3.66 MIL/uL — ABNORMAL LOW (ref 3.87–5.11)
RDW: 12.5 % (ref 11.5–15.5)

## 2012-06-06 LAB — APTT: aPTT: 31 seconds (ref 24–37)

## 2012-06-06 LAB — PROTIME-INR
INR: 1.06 (ref 0.00–1.49)
Prothrombin Time: 14 seconds (ref 11.6–15.2)

## 2012-06-06 LAB — URINE MICROSCOPIC-ADD ON

## 2012-06-06 MED ORDER — ACETAMINOPHEN-CODEINE #3 300-30 MG PO TABS: 1.0000 | ORAL_TABLET | Freq: Four times a day (QID) | ORAL | Status: AC | PRN

## 2012-06-06 MED ORDER — ACETAMINOPHEN 325 MG PO TABS
650.0000 mg | ORAL_TABLET | Freq: Once | ORAL | Status: AC
  Administered 2012-06-06: 650 mg via ORAL
  Filled 2012-06-06: qty 2

## 2012-06-06 NOTE — ED Provider Notes (Signed)
History     CSN: [id number]  Arrival date & time 06/06/12  1018   First MD Initiated Contact with Patient 06/06/12 1307      Chief Complaint  Patient presents with  . Numbness    (Consider location/radiation/quality/duration/timing/severity/associated sxs/prior treatment) The history is provided by the patient and medical records.   Alexandria Beck is a 78 y.o. female presents to the emergency department complaining of numbness in the top of her head and in her L arm.  The onset of the symptoms was  gradual starting 1 week ago.  The patient has associated 8/10 pain in the L side of her neck with occasional stiffness of the muscle  She describes the sensation as prickly and tingly.  Sometimes she has the tingling in her L arm and L leg, but they are not always together.  The symptoms have been  intermittent, gradually worsened.  Nothing makes the symptoms worse and nothing makes symptoms better; they resolve on their own.  The patient denies fatigue, fever, chills, nausea, vomiting, diarrhea, abdominal pain, chest pain, shortness of breath, weakness, lightheadedness, dizziness, vision changes.  She has a FHx of CVA, but no personal Hx.  She has no pain in her legs, swelling of the legs or Hx of DVT.    The patient has medical history significant for:  Past Medical History  Diagnosis Date  . Near syncope     diagnosed as anxiety related. gets some relief with clonazepam. No formal testing done.  [name] GERD (gastroesophageal reflux disease)     has had EGD 2006-negative  . Allergy     seasonal  . Irregular heart beat     no testing, holter, etc. Takes no medication  . Hypertension      On no medication. Home readings SBP=150's  . Diverticulosis     2004  . Internal hemorrhoids   . Esophageal stricture          Past Medical History  Diagnosis Date  . Near syncope     diagnosed as anxiety related. gets some relief with clonazepam. No formal testing done.  [name] GERD (gastroesophageal  reflux disease)     has had EGD 2006-negative  . Allergy     seasonal  . Irregular heart beat     no testing, holter, etc. Takes no medication  . Hypertension      On no medication. Home readings SBP=150's  . Diverticulosis     2004  . Internal hemorrhoids   . Esophageal stricture     Past Surgical History  Procedure Date  . Abdominal hysterectomy 1974    fibroid tumors w/ metorrhagia  . Tonsillectomy   . G4p4 - nsvd  midwife delivery for two   . Tonsillectomy     Family History  Problem Relation Age of Onset  . Heart disease Mother   . Stroke Mother   . Hypertension Mother   . Cancer Father     lung cancer - smoker  . Heart disease Sister     MI  . Kidney disease Sister     end stage renal disease on HD  . Diabetes Sister   . Heart disease Brother   . Diabetes Sister   . Hypertension Sister   . Kidney disease Sister   . Cancer Sister   . Diabetes Sister   . Kidney disease Sister     HD  . Hypertension Sister   . Heart disease Sister   . Hypertension Sister   .  Heart disease Sister   . Hypertension Sister   . Hypertension Sister     History  Substance Use Topics  . Smoking status: Never Smoker   . Smokeless tobacco: Never Used  . Alcohol Use: No    OB History    Grav Para Term Preterm Abortions TAB SAB Ect Mult Living                  Review of Systems  Constitutional: Negative for fever, diaphoresis, appetite change, fatigue and unexpected weight change.  HENT: Positive for neck pain and neck stiffness. Negative for mouth sores.   Eyes: Negative for visual disturbance.  Respiratory: Negative for cough, chest tightness, shortness of breath and wheezing.   Cardiovascular: Negative for chest pain.  Gastrointestinal: Negative for nausea, vomiting, abdominal pain, diarrhea and constipation.  Genitourinary: Negative for dysuria, urgency, frequency and hematuria.  Musculoskeletal: Negative for back pain.  Skin: Negative for rash.  Neurological:  Positive for numbness (tingling). Negative for syncope, light-headedness and headaches.  Hematological: Does not bruise/bleed easily.  Psychiatric/Behavioral: Negative for disturbed wake/sleep cycle. The patient is not nervous/anxious.     Allergies  Clonazepam and Aspirin  Home Medications   Current Outpatient Rx  Name Route Sig Dispense Refill  . DIPHENHYDRAMINE HCL 50 MG PO CAPS Oral Take 50 mg by mouth every 6 (six) hours as needed. For sleep/itching    . ESOMEPRAZOLE MAGNESIUM 40 MG PO CPDR Oral Take 1 capsule (40 mg total) by mouth daily before breakfast. 30 capsule 11  . ADULT MULTIVITAMIN W/MINERALS CH Oral Take 1 tablet by mouth daily.    [name] VITAMIN C 500 MG PO TABS Oral Take 500 mg by mouth daily.    [name] ZOLPIDEM TARTRATE 5 MG PO TABS Oral Take 1 tablet (5 mg total) by mouth at bedtime as needed for sleep. 30 tablet 0    PATIENT NEEDS OFFICE VISIT FOR FUTURE REFILL AUTHO .[name].  . DIPHENHYDRAMINE HCL 50 MG PO TABS Oral Take 1 tablet (50 mg total) by mouth every 6 (six) hours as needed for itching. 100 tablet 11    BP 143/63  Pulse 66  Temp 97.4 F (36.3 C) (Oral)  Resp 15  SpO2 100%  Physical Exam  Nursing note and vitals reviewed. Constitutional: She is oriented to person, place, and time. She appears well-developed and well-nourished. No distress.  HENT:  Head: Normocephalic and atraumatic.  Mouth/Throat: Oropharynx is clear and moist. No oropharyngeal exudate.  Eyes: Conjunctivae are normal. No scleral icterus.  Neck: Normal range of motion. Neck supple.  Cardiovascular: Normal rate, regular rhythm, normal heart sounds and intact distal pulses.  Exam reveals no gallop and no friction rub.   No murmur heard. Pulmonary/Chest: Effort normal and breath sounds normal. No respiratory distress. She has no wheezes.  Abdominal: Soft. Bowel sounds are normal. She exhibits no mass. There is no tenderness. There is no rebound and no guarding.  Musculoskeletal: Normal range of  motion. She exhibits no edema and no tenderness.  Lymphadenopathy:    She has no cervical adenopathy.  Neurological: She is alert and oriented to person, place, and time. She has normal reflexes. No cranial nerve deficit. She exhibits normal muscle tone. Coordination normal.       Speech is clear and goal oriented, follows commands Cranial nerves III - XII without deficit, no facial droop Normal strength in upper and lower extremities bilaterally, strong and equal grip strength Sensation normal to light and sharp touch Moves extremities without  ataxia, coordination intact Normal finger to nose and rapid alternating movements Neg romberg, no pronator drift Normal gait  Skin: Skin is warm and dry. She is not diaphoretic.  Psychiatric: She has a normal mood and affect.    ED Course  Procedures (including critical care time)  Labs Reviewed  BASIC METABOLIC PANEL - Abnormal; Notable for the following:    Glucose, Bld 100 (*)     GFR calc non Af Amer 79 (*)     All other components within normal limits  CBC WITH DIFFERENTIAL - Abnormal; Notable for the following:    RBC 3.66 (*)     Hemoglobin 11.0 (*)     HCT 32.7 (*)     All other components within normal limits  CBC  COMPREHENSIVE METABOLIC PANEL  PROTIME-INR  APTT  URINALYSIS, ROUTINE W REFLEX MICROSCOPIC  LIPID PANEL  HEMOGLOBIN A1C   Ct Head Wo Contrast  06/06/2012  *RADIOLOGY REPORT*  Clinical Data:  Numbness  CT HEAD WITHOUT CONTRAST CT CERVICAL SPINE WITHOUT CONTRAST  Technique:  Multidetector CT imaging of the head and cervical spine was performed following the standard protocol without intravenous contrast.  Multiplanar CT image reconstructions of the cervical spine were also generated.  Comparison:  CT head dated 11/18/2011  CT HEAD  Findings: No evidence of parenchymal hemorrhage or extra-axial fluid collection. No mass lesion, mass effect, or midline shift.  No CT evidence of acute infarction.  Intracranial  atherosclerosis.  Mild atrophy, likely age related.  No ventriculomegaly.  The visualized paranasal sinuses are essentially clear. The mastoid air cells are unopacified.  No evidence of calvarial fracture. No cortical exostosis overlying the right frontoparietal bone (series 3/image 41).  IMPRESSION: No evidence of acute intracranial abnormality.  CT CERVICAL SPINE  Findings: Reversal of the normal cervical lordosis.  No evidence of fracture or dislocation.  Vertebral body heights are maintained.  The dens appears intact.  No prevertebral soft tissue swelling.  Moderate multilevel degenerative changes.  Visualized right thyroid is mildly enlarged/nodular.  IMPRESSION: No evidence of traumatic injury to the cervical spine.  Mild multilevel degenerative changes.  Original Report Authenticated By: Charline Bills, M.D.   Ct Cervical Spine Wo Contrast  06/06/2012  *RADIOLOGY REPORT*  Clinical Data:  Numbness  CT HEAD WITHOUT CONTRAST CT CERVICAL SPINE WITHOUT CONTRAST  Technique:  Multidetector CT imaging of the head and cervical spine was performed following the standard protocol without intravenous contrast.  Multiplanar CT image reconstructions of the cervical spine were also generated.  Comparison:  CT head dated 11/18/2011  CT HEAD  Findings: No evidence of parenchymal hemorrhage or extra-axial fluid collection. No mass lesion, mass effect, or midline shift.  No CT evidence of acute infarction.  Intracranial atherosclerosis.  Mild atrophy, likely age related.  No ventriculomegaly.  The visualized paranasal sinuses are essentially clear. The mastoid air cells are unopacified.  No evidence of calvarial fracture. No cortical exostosis overlying the right frontoparietal bone (series 3/image 41).  IMPRESSION: No evidence of acute intracranial abnormality.  CT CERVICAL SPINE  Findings: Reversal of the normal cervical lordosis.  No evidence of fracture or dislocation.  Vertebral body heights are maintained.  The dens  appears intact.  No prevertebral soft tissue swelling.  Moderate multilevel degenerative changes.  Visualized right thyroid is mildly enlarged/nodular.  IMPRESSION: No evidence of traumatic injury to the cervical spine.  Mild multilevel degenerative changes.  Original Report Authenticated By: Charline Bills, M.D.     1. Numbness and tingling in  left hand       MDM  Roshelle B Rudman intermittent tingling in the top of her head and L arm x 1 week. No symptoms on evaluation and no neuro deficits on exam.  Will CT and consider TIA protocol.  CBC and CMP unremarkable.  Head CT without evidence of acute intracranial abnormality; multilevel degenerative changes in the c-spine.  With the persistence of intermittent numbness and tingling x 1 week I feel the TIA protocol is appropriate.  I have discussed this with Dr Clarene Duke and she is in agreement.  I have also discussed this with the patient and she is amenable.  I have also discussed the patient with the [facility] PA-C.          Dahlia Client Depaul Arizpe, PA-C 06/06/12 1539

## 2012-06-06 NOTE — Progress Notes (Signed)
  Echocardiogram 2D Echocardiogram has been performed.  Carrisa Keller 06/06/2012, 4:17 PM

## 2012-06-06 NOTE — ED Notes (Signed)
Pt c/o numbness to the top of head starting yesterday and running down neck into left hand; no neuro deficits noted; pt denies vision change

## 2012-06-06 NOTE — ED Notes (Signed)
Pt ambulatory, laughing and conversing in waiting room. Informed of delay.

## 2012-06-06 NOTE — ED Notes (Signed)
Was seen and examined by [facility].

## 2012-06-06 NOTE — ED Provider Notes (Signed)
Medical screening examination/treatment/procedure(s) were conducted as a shared visit with non-physician practitioner(s) and myself.  I personally evaluated the patient during the encounter.  78yo F, c/o gradual onset and persistence of multiple intermittent episodes of "tingling" and "numbness" for the past 1 week. Pt states the numbness begins at the top of her head, radiates down her left neck and arm to hand.  Has been associated with "tingling" of her left toes.  Denies CP/SOB, no cough, no fever, no facial droop, no dysarthria, no dysphagia, no visual changes, no focal motor weakness. NAD, VSS, CTA, RRR, abd soft/NT, neuro non-focal and without acute deficits.  CT-H without acute process, CT-CS with mild DDD.  Will place on CDU TIA protocol.    Laray Anger, DO 06/07/12 1349

## 2012-06-06 NOTE — ED Provider Notes (Signed)
Medical screening examination/treatment/procedure(s) were performed by non-physician practitioner and as supervising physician I was immediately available for consultation/collaboration.   Richardean Canal, MD 06/06/12 [phone]

## 2012-06-06 NOTE — ED Provider Notes (Signed)
Patient's tests have shown no evidence of acute processes or ischemic changes. She reports resolution of her numbness and tingling as of now. She will be discharged home with valium for her neck pain and instructions to follow up with her PCP for further evaluation and management of her symptoms. Plan discussed with Dr. Silverio Lay who is agreeable. 9:14 PM   Emilia Beck, PA-C 06/06/12 2114

## 2012-06-06 NOTE — Progress Notes (Signed)
*  PRELIMINARY RESULTS* Vascular Ultrasound Carotid Duplex (Doppler) has been completed.  Preliminary findings: Right= 40-59% distal ICA stenosis with antegrade vertebral flow. Left= no significant ICA stenosis with antegrade vertebral flow.  Farrel Demark, RDMS, RVT  06/06/2012, 4:37 PM

## 2012-06-06 NOTE — ED Notes (Signed)
Patient presented to the ER with complaint of numbness that started to the top of head radiating to the lt side of her face and arm that got worse last night. Patient denies any dizziness, no shortness of breath, no chest discomfort. Skin is warm and dry, respiration is even and unlabored.

## 2012-06-07 LAB — HEMOGLOBIN A1C: Mean Plasma Glucose: 126 mg/dL — ABNORMAL HIGH (ref <117)

## 2012-06-07 NOTE — ED Provider Notes (Signed)
Medical screening examination/treatment/procedure(s) were conducted as a shared visit with non-physician practitioner(s) and myself.  I personally evaluated the patient during the encounter Please see my previous note.   Laray Anger, DO 06/07/12 1349

## 2012-06-18 ENCOUNTER — Encounter: Payer: Self-pay | Admitting: Internal Medicine

## 2012-06-18 ENCOUNTER — Ambulatory Visit (INDEPENDENT_AMBULATORY_CARE_PROVIDER_SITE_OTHER): Payer: Medicare Other | Admitting: Internal Medicine

## 2012-06-18 VITALS — BP 132/80 | HR 87 | Temp 99.0°F | Resp 16 | Wt 169.0 lb

## 2012-06-18 DIAGNOSIS — R202 Paresthesia of skin: Secondary | ICD-10-CM

## 2012-06-18 DIAGNOSIS — F411 Generalized anxiety disorder: Secondary | ICD-10-CM

## 2012-06-18 DIAGNOSIS — I10 Essential (primary) hypertension: Secondary | ICD-10-CM

## 2012-06-18 DIAGNOSIS — F419 Anxiety disorder, unspecified: Secondary | ICD-10-CM

## 2012-06-18 DIAGNOSIS — R209 Unspecified disturbances of skin sensation: Secondary | ICD-10-CM

## 2012-06-18 MED ORDER — ALPRAZOLAM 0.5 MG PO TBDP: 0.5000 mg | ORAL_TABLET | Freq: Two times a day (BID) | ORAL | Status: DC | PRN

## 2012-06-18 NOTE — Assessment & Plan Note (Signed)
Patient was intolerant of sertraline - felt strange. She did try alprazolam which did work well for her.  Plan  Alprazolam 0.5 mg 1/2 tab q 6 prn and q HS for sleep and dream control.

## 2012-06-18 NOTE — Assessment & Plan Note (Signed)
Has had a thorough work up for possible CNS Cause: August 9, '13 studies: CT head and neck: no acute or abnormal finding. MRI head ( unchanged from [facility] '12 study): IMPRESSION:  No acute infarct.  Mild small vessel disease type changes.  Cervical spondylotic changes with mild spinal stenosis C3-4 and C4-  5 with cord flattening slightly greater at the C4-5 level. MRA head (no change from Oct '12): IMPRESSION:  Mild intracranial atherosclerotic type changes as detailed above. Carotid Doppler: 0-39% Left; 40-59% Right 2 D echo - normal EF, no abnormality.  Normal exam on follow-up. For recurrent paresthesia will need to work up radiculopathy - NCS.

## 2012-06-18 NOTE — Patient Instructions (Addendum)
Episode of tingling - absolutely complete evaluation for stroke or TIA was normal!. You may have some mild tingling (paresthesia) from degenerative joint disease/disk disease of the cervical spine. No need for treatment at this time.  Rash- not sure what this itching rash is. I doubt it is persistent scabies. If it continues to be a problem - referral to dermatologist  Anxiety - sertraline is stopped. OK to take 1/2 of a alprazolam (xanax) every 6 hours as needed and at bedtime to help with sleep.

## 2012-06-18 NOTE — Progress Notes (Signed)
Subjective:    Patient ID: Alexandria Beck, female    DOB: Apr 21, 1931, 78 y.o.   MRN: [id number]  HPI Alexandria Beck presents for ED follow-up. She was seen August 9th for hand tingling and scalp tingling. She had a full neuro workup in the [facility] - physical exam was non-focal; all studies reviewed: negative CT Brain, CT neck, negative MRI Brain (no change from Oct '12), negative MRA head, carotid doppler with 0-39% Left, 40-59% right; normal 2 D echo. Labs were normal. Most of her symptoms have resolved. She still has a little tingling but nothing that interferes with her daily activities.   She has been intolerant of sertraline - it makes her feel very strange. She  Developed a rash to APAP w/ codeine. She does need something to help her sleep and to help with anxiety. She has tried alprazolam which worked very well.   Past Medical History  Diagnosis Date  . Near syncope     diagnosed as anxiety related. gets some relief with clonazepam. No formal testing done.  Alexandria Beck GERD (gastroesophageal reflux disease)     has had EGD 2006-negative  . Allergy     seasonal  . Irregular heart beat     no testing, holter, etc. Takes no medication  . Hypertension      On no medication. Home readings SBP=150's  . Diverticulosis     2004  . Internal hemorrhoids   . Esophageal stricture    Past Surgical History  Procedure Date  . Abdominal hysterectomy 1974    fibroid tumors w/ metorrhagia  . Tonsillectomy   . G4p4 - nsvd  midwife delivery for two   . Tonsillectomy    Family History  Problem Relation Age of Onset  . Heart disease Mother   . Stroke Mother   . Hypertension Mother   . Cancer Father     lung cancer - smoker  . Heart disease Sister     MI  . Kidney disease Sister     end stage renal disease on HD  . Diabetes Sister   . Heart disease Brother   . Diabetes Sister   . Hypertension Sister   . Kidney disease Sister   . Cancer Sister   . Diabetes Sister   . Kidney disease Sister     HD    . Hypertension Sister   . Heart disease Sister   . Hypertension Sister   . Heart disease Sister   . Hypertension Sister   . Hypertension Sister    History   Social History  . Marital Status: Widowed    Spouse Name: N/A    Number of Children: 4  . Years of Education: 11   Occupational History  . stock person     retired   Social History Main Topics  . Smoking status: Never Smoker   . Smokeless tobacco: Never Used  . Alcohol Use: No  . Drug Use: No  . Sexually Active: Not Currently   Other Topics Concern  . Not on file   Social History Narrative   11th grade. Married '52- '92/widowed. 2 sons- '55, '63; 2 dtrs - '54, '60. 12 grandchildren. 7 greatgrands. Lives in own home with a son who is in and out. Work - [employer], Psychologist, prison and probation services.Do you drink Alcoholic Beverages-No. Do you drink caffienated Beverages- No. Do you use seatbelt often- Yes. Do you exercise at least 3 times a week- . Do you take vitamins- . Do you use a  hearing aid- No. Do you wear dentures- Yes. Is there a smoke alarm in your house- Yes. Are there guns and firearms in your household- . Have you experienced physical abuse- No.Usual # of hours of sleep per night 3. Number of people living at your residence- 2    Current Outpatient Prescriptions on File Prior to Visit  Medication Sig Dispense Refill  . diphenhydrAMINE (BENADRYL) 50 MG capsule Take 50 mg by mouth every 6 (six) hours as needed. For sleep/itching      . esomeprazole (NEXIUM) 40 MG capsule Take 1 capsule (40 mg total) by mouth daily before breakfast.  30 capsule  11  . Multiple Vitamin (MULTIVITAMIN WITH MINERALS) TABS Take 1 tablet by mouth daily.      . vitamin C (ASCORBIC ACID) 500 MG tablet Take 500 mg by mouth daily.      Alexandria Beck zolpidem (AMBIEN) 5 MG tablet Take 1 tablet (5 mg total) by mouth at bedtime as needed for sleep.  30 tablet  0  . diphenhydrAMINE (BENADRYL) 50 MG tablet Take 1 tablet (50 mg total) by mouth every 6 (six) hours as needed for  itching.  100 tablet  11      Review of Systems System review is negative for any constitutional, cardiac, pulmonary, GI or neuro symptoms or complaints other than as described in the HPI.     Objective:   Physical Exam Filed Vitals:   06/18/12 1056  BP: 132/80  Pulse: 87  Temp: 99 F (37.2 C)  Resp: 16   Gen'l- WNWD AA woman in no distress HEENT- C&S clear Cor- RRR Pulm - normal Neuro - A&O x 3, XCN II-XII normal, normal gait, no tremor, no sensation hands.       Assessment & Plan:

## 2012-06-18 NOTE — Assessment & Plan Note (Signed)
BP Readings from Last 3 Encounters:  06/18/12 132/80  06/06/12 144/71  01/23/12 100/60   Good control

## 2012-06-19 ENCOUNTER — Telehealth: Payer: Self-pay | Admitting: *Deleted

## 2012-06-19 NOTE — Telephone Encounter (Signed)
Medication alprazolam changed from dissolveable tablet to regular tablet.

## 2012-08-28 ENCOUNTER — Other Ambulatory Visit: Payer: Self-pay | Admitting: Internal Medicine

## 2012-08-28 DIAGNOSIS — Z1231 Encounter for screening mammogram for malignant neoplasm of breast: Secondary | ICD-10-CM

## 2012-09-05 ENCOUNTER — Other Ambulatory Visit: Payer: Self-pay | Admitting: *Deleted

## 2012-09-05 MED ORDER — ESOMEPRAZOLE MAGNESIUM 40 MG PO CPDR: 40.0000 mg | DELAYED_RELEASE_CAPSULE | Freq: Every day | ORAL | Status: DC

## 2012-10-09 ENCOUNTER — Ambulatory Visit
Admission: RE | Admit: 2012-10-09 | Discharge: 2012-10-09 | Disposition: A | Discharge status: Home / Self Care | Payer: Medicare Other | Source: Ambulatory Visit | Attending: Internal Medicine | Admitting: Internal Medicine

## 2012-10-09 DIAGNOSIS — Z1231 Encounter for screening mammogram for malignant neoplasm of breast: Secondary | ICD-10-CM

## 2013-01-08 ENCOUNTER — Ambulatory Visit (INDEPENDENT_AMBULATORY_CARE_PROVIDER_SITE_OTHER): Payer: Medicare Other | Admitting: Internal Medicine

## 2013-01-08 ENCOUNTER — Encounter: Payer: Self-pay | Admitting: Internal Medicine

## 2013-01-08 VITALS — BP 158/96 | HR 94 | Temp 98.6°F | Resp 12 | Wt 174.0 lb

## 2013-01-08 DIAGNOSIS — F411 Generalized anxiety disorder: Secondary | ICD-10-CM

## 2013-01-08 MED ORDER — GABAPENTIN 100 MG PO CAPS: 100.0000 mg | ORAL_CAPSULE | Freq: Three times a day (TID) | ORAL | Status: DC

## 2013-01-08 MED ORDER — SERTRALINE HCL 25 MG PO TABS: 25.0000 mg | ORAL_TABLET | Freq: Every day | ORAL | Status: DC

## 2013-01-08 NOTE — Patient Instructions (Addendum)
1. Tingling in the leg is very likely due to nerve compression from disk disease. You had an MRI in '05 that did show some disk disease at that time.  Plan Trial of gabapentin 100 mg: start with one a day at night for 5 days, then 1 AM and 1PM for 5 days and then three times a day.   2. Anxiety - for relief of symptoms and to minimize medication side effects we should use something other than Xanax.  Plan Sertraline 25 mg at bedtime daily. Try this for at least a week unless it really causes trouble.

## 2013-01-08 NOTE — Assessment & Plan Note (Addendum)
Assessment: increased anxiety despite daily benzodiazapine use, pt cannot remember what her adverse reaction was to sertraline.  Would rather discontinue benzodiazapine therapy given potential side effects. Plan: - Sertraline 25 mg at bedtime daily. Try this for at least a week unless it really causes trouble. - call the office if you have any adverse reaction

## 2013-01-08 NOTE — Assessment & Plan Note (Addendum)
Assessment: pain likely d/t nerve impingement in the spine, B12 was >800 in 2012, most recent TSH 0.9, no history of diabetes Plan: - Trial of gabapentin 100 mg: start with one a day at night for 5 days, then 1 AM and 1PM for 5 days and then three times a day.  - If gabapentin trial fails consider repeat MRI to look for potential steroid injection site

## 2013-01-08 NOTE — Progress Notes (Signed)
Subjective:     Patient ID: Alexandria Beck, female   DOB: 12-Feb-1931, 79 y.o.   MRN: [id number]  HPI Alexandria Beck is a 79 yo woman with a hx of HTN and anxiety that presents today to discuss left leg pain.  She began to notice the pain back in August and reports that she was receiving steroid injections to her spine from a Dr. Wyn Quaker.  She describes the pain as a tingling that starts in the lateral aspect of her foot and radiates up the lateral leg and thigh through the buttock and into her spine.  It is worse when lifting heavier objects and when she has increased activity like working out in the yard.  It is not particularly distressing to her life only noting that she can't keep up with her grandchildren as she used to.  Recently she has been using topical ointment with good relief.  As a secondary complaint Alexandria Beck mentions increased anxiety.  She currently takes Xanax and about a month ago ran out of her prescription and did not have it renewed.  She states the Xanax (which she took on a regular basis) wasn't helping as much as it used to.  To help with her current anxiety she took some unknown medication that was originally prescribed to her granddaughter at the granddaughter's advice.  Past Medical History  Diagnosis Date  . Near syncope     diagnosed as anxiety related. gets some relief with clonazepam. No formal testing done.  [name] GERD (gastroesophageal reflux disease)     has had EGD 2006-negative  . Allergy     seasonal  . Irregular heart beat     no testing, holter, etc. Takes no medication  . Hypertension      On no medication. Home readings SBP=150's  . Diverticulosis     2004  . Internal hemorrhoids   . Esophageal stricture    Past Surgical History  Procedure Laterality Date  . Abdominal hysterectomy  1974    fibroid tumors w/ metorrhagia  . Tonsillectomy    . G4p4 - nsvd  midwife delivery for two    . Tonsillectomy     Family History  Problem Relation Age of Onset  . Heart  disease Mother   . Stroke Mother   . Hypertension Mother   . Cancer Father     lung cancer - smoker  . Heart disease Sister     MI  . Kidney disease Sister     end stage renal disease on HD  . Diabetes Sister   . Heart disease Brother   . Diabetes Sister   . Hypertension Sister   . Kidney disease Sister   . Cancer Sister   . Diabetes Sister   . Kidney disease Sister     HD  . Hypertension Sister   . Heart disease Sister   . Hypertension Sister   . Heart disease Sister   . Hypertension Sister   . Hypertension Sister    History   Social History  . Marital Status: Widowed    Spouse Name: N/A    Number of Children: 4  . Years of Education: 11   Occupational History  . stock person     retired   Social History Main Topics  . Smoking status: Never Smoker   . Smokeless tobacco: Never Used  . Alcohol Use: No  . Drug Use: No  . Sexually Active: Not Currently   Other Topics Concern  .  Not on file   Social History Narrative   11th grade. Married '52- '92/widowed. 2 sons- '55, '63; 2 dtrs - '54, '60. 12 grandchildren. 7 greatgrands. Lives in own home with a son who is in and out. Work - [employer], Psychologist, prison and probation services.      Do you drink Alcoholic Beverages-No. Do you drink caffienated Beverages- No. Do you use seatbelt often- Yes. Do you exercise at least 3 times a week- . Do you take vitamins- . Do you use a hearing aid- No. Do you wear dentures- Yes. Is there a smoke alarm in your house- Yes. Are there guns and firearms in your household- . Have you experienced physical abuse- No.Usual # of hours of sleep per night 3. Number of people living at your residence- 2                         Current Outpatient Prescriptions on File Prior to Visit  Medication Sig Dispense Refill  . ALPRAZolam (XANAX) 0.5 MG tablet Take 1 tablet by mouth 2 times daily as needed for anxiety,. Take 1/2 tablet as needed during the day or at bedtime      . diphenhydrAMINE (BENADRYL) 50 MG capsule Take 50  mg by mouth every 6 (six) hours as needed. For sleep/itching      . esomeprazole (NEXIUM) 40 MG capsule Take 1 capsule (40 mg total) by mouth daily before breakfast.  30 capsule  11  . Multiple Vitamin (MULTIVITAMIN WITH MINERALS) TABS Take 1 tablet by mouth daily.      . vitamin C (ASCORBIC ACID) 500 MG tablet Take 500 mg by mouth daily.      [name] zolpidem (AMBIEN) 5 MG tablet Take 1 tablet (5 mg total) by mouth at bedtime as needed for sleep.  30 tablet  0  . diphenhydrAMINE (BENADRYL) 50 MG tablet Take 1 tablet (50 mg total) by mouth every 6 (six) hours as needed for itching.  100 tablet  11   No current facility-administered medications on file prior to visit.   Review of Systems  Constitutional: Negative for fever and chills.  Respiratory: Negative for shortness of breath.   Cardiovascular: Negative for chest pain.  Musculoskeletal: Positive for myalgias and back pain.  Psychiatric/Behavioral:       Increased anxiety      Objective:   Physical Exam General: elderly african american woman resting comfortably in a chair in no apparent distress HEENT: [place]/AT, EOMI, orpharynx clean and non-erythematous, MMM CV: RRR, II/VI systolic ejection murmur best heard in the upper right sternal border with radiation to the left sternal border Pulm: CTA bilaterally, nl WOB, no wheezes, rales, or rhonci Abd: obese, soft, non-tender, non-distended Extremities: 2+ radial pulses bilaterally, 2+ DP pulse in the right foot, 1+ DP pulse in the left foot Neuro: back exam (normal walk, heel/toe walking, sit to stand, step up, lower extremity strength exam, seated straight leg raise, lying straight leg raise, DTRs, vibratory sensation) elicited pain described in HPI with resistance to hip flexure.  Decreased vibratory sense in left foot getting worse as we tested more distally Psych: pt is anxious at the onset of the interview but eventually calmed down as we talked more    Assessment/Plan:    I have  personally interviewed and examined Alexandria Beck and agree with the assessment and plans documented by Mr. Lucretia Roers, MS III

## 2013-02-16 ENCOUNTER — Other Ambulatory Visit: Payer: Self-pay | Admitting: Internal Medicine

## 2013-02-16 NOTE — Telephone Encounter (Signed)
Pt can't take the zoloft,  She wants to go back on the xanex. Her cell [phone].

## 2013-02-17 ENCOUNTER — Other Ambulatory Visit: Payer: Self-pay | Admitting: Internal Medicine

## 2013-02-17 MED ORDER — ALPRAZOLAM 0.5 MG PO TABS: 0.2500 mg | ORAL_TABLET | Freq: Two times a day (BID) | ORAL | Status: DC | PRN

## 2013-02-17 NOTE — Telephone Encounter (Signed)
ok 

## 2013-02-17 NOTE — Telephone Encounter (Signed)
Rx faxed to pharmacy, pt advised  

## 2013-04-27 ENCOUNTER — Ambulatory Visit (INDEPENDENT_AMBULATORY_CARE_PROVIDER_SITE_OTHER): Payer: Medicare Other | Admitting: Internal Medicine

## 2013-04-27 ENCOUNTER — Encounter: Payer: Self-pay | Admitting: Internal Medicine

## 2013-04-27 VITALS — BP 132/82 | HR 79 | Temp 98.0°F | Resp 18 | Wt 164.4 lb

## 2013-04-27 DIAGNOSIS — R209 Unspecified disturbances of skin sensation: Secondary | ICD-10-CM

## 2013-04-27 DIAGNOSIS — M5416 Radiculopathy, lumbar region: Secondary | ICD-10-CM

## 2013-04-27 DIAGNOSIS — M5136 Other intervertebral disc degeneration, lumbar region: Secondary | ICD-10-CM

## 2013-04-27 DIAGNOSIS — IMO0002 Reserved for concepts with insufficient information to code with codable children: Secondary | ICD-10-CM

## 2013-04-27 DIAGNOSIS — R202 Paresthesia of skin: Secondary | ICD-10-CM

## 2013-04-27 DIAGNOSIS — M5137 Other intervertebral disc degeneration, lumbosacral region: Secondary | ICD-10-CM

## 2013-04-27 MED ORDER — ZOLPIDEM TARTRATE 5 MG PO TABS: 5.0000 mg | ORAL_TABLET | Freq: Every evening | ORAL | Status: DC | PRN

## 2013-04-27 NOTE — Patient Instructions (Signed)
It was good to see you today. We have reviewed your prior records including labs and tests today we'll make referral for MRI of back and referred to specialist to perform back injections and help leg pain . Our office will contact you regarding appointment(s) once made. Until then, continue the gabapentin 3 times daily for pain as ongoing Other medications reviewed and updated, refills provided as requested

## 2013-04-27 NOTE — Progress Notes (Signed)
Subjective:    Patient ID: Alexandria Beck, female    DOB: 09-05-1931, 79 y.o.   MRN: [id number]  HPI  Here for continued left leg pain Describes as burning and numbness>pain symptoms worse at night/supine position Office visit for same March 2014 reviewed - rx/d gabapentin - somewhat improved symptoms on same Denies weakness or stumbling, no falls Remote history of same, reports improvement following injections into back for prior right leg symptoms in 2005 No swelling of the leg or joint No trauma recalled to leg or back No rash, bruising or color change  Past Medical History  Diagnosis Date  . Near syncope     diagnosed as anxiety related. gets some relief with clonazepam. No formal testing done.  [name] GERD (gastroesophageal reflux disease)     has had EGD 2006-negative  . Allergy     seasonal  . Irregular heart beat     no testing, holter, etc. Takes no medication  . Hypertension      On no medication. Home readings SBP=150's  . Diverticulosis     2004  . Internal hemorrhoids   . Esophageal stricture      Review of Systems  Musculoskeletal: Negative for back pain, joint swelling and gait problem.  Skin: Negative for rash and wound.  Neurological: Positive for numbness. Negative for dizziness, facial asymmetry and weakness.       Objective:   Physical Exam BP 132/82  Pulse 79  Temp(Src) 98 F (36.7 C) (Oral)  Resp 18  Wt 164 lb 6.4 oz (74.571 kg)  BMI 30.06 kg/m2  SpO2 98% Wt Readings from Last 3 Encounters:  04/27/13 164 lb 6.4 oz (74.571 kg)  01/08/13 174 lb (78.926 kg)  06/18/12 169 lb (76.658 kg)   Constitutional: She appears well-developed and well-nourished. No distress.  Cardiovascular: Normal rate, regular rhythm and normal heart sounds.  No murmur heard. No BLE edema. Pulmonary/Chest: Effort normal and breath sounds normal. No respiratory distress. She has no wheezes.  Musculoskeletal: Back: full range of motion of thoracic and lumbar spine. Non  tender to palpation. Negative straight leg raise. DTR's are symmetrically intact. Sensation diminished in L L5/S1 distribution but full strength to manual muscle testing. patient is able to heel toe walk without difficulty and ambulates with mildly antalgic gait. Neurological: She is alert and oriented to person, place, and time. No cranial nerve deficit. Coordination, balance, strength, speech and gait are normal.  Skin: Skin is warm and dry. No rash noted. No erythema.  Psychiatric: She has a normal mood and affect. Her behavior is normal. Judgment and thought content normal.   Lab Results  Component Value Date   WBC 5.8 06/06/2012   HGB 11.0* 06/06/2012   HCT 32.7* 06/06/2012   PLT 307 06/06/2012   GLUCOSE 100* 06/06/2012   CHOL 216* 06/06/2012   TRIG 89 06/06/2012   HDL 70 06/06/2012   LDLCALC 130* 06/06/2012   ALT 16 08/09/2011   AST 24 08/09/2011   NA 139 06/06/2012   K 3.7 06/06/2012   CL 105 06/06/2012   CREATININE 0.74 06/06/2012   BUN 10 06/06/2012   CO2 25 06/06/2012   TSH 0.911 08/09/2011   INR 1.06 06/06/2012   HGBA1C 6.0* 06/06/2012   MRI l-spine 2005: (in EMR imaging reports) IMPRESSION  1. 5-6 mm degenerative slip of L4 forward on L5 associated with facet degenerative change; bilateral L5 nerve root encroachment is present in the foramina.  2. Broad based disk protrusion at L5-S1  with biforaminal extension; bilateral L5 and left S1 nerve root encroachment is present.  3. Small central protrusion at L3-4.  4. Lower lumbar facet arthropathy, worst at L4-5.       Assessment & Plan:  Left lumbar radiculopathy - L%/s1 by hx nd exam Hx same on RLE in 2005 - remote ESI with improved symptoms (unable to recall who/where these were done) Reviewed MRI 2005 as above Schedule an MRI of L-spine now and refer to orthopedics for ESI consideration as needed

## 2013-05-08 ENCOUNTER — Ambulatory Visit
Admission: RE | Admit: 2013-05-08 | Discharge: 2013-05-08 | Disposition: A | Discharge status: Home / Self Care | Payer: Medicare Other | Source: Ambulatory Visit | Attending: Internal Medicine | Admitting: Internal Medicine

## 2013-05-08 DIAGNOSIS — M5416 Radiculopathy, lumbar region: Secondary | ICD-10-CM

## 2013-05-08 DIAGNOSIS — M5136 Other intervertebral disc degeneration, lumbar region: Secondary | ICD-10-CM

## 2013-05-14 ENCOUNTER — Other Ambulatory Visit: Payer: Self-pay | Admitting: Sports Medicine

## 2013-05-14 DIAGNOSIS — M4800 Spinal stenosis, site unspecified: Secondary | ICD-10-CM

## 2013-05-18 ENCOUNTER — Ambulatory Visit
Admission: RE | Admit: 2013-05-18 | Discharge: 2013-05-18 | Disposition: A | Discharge status: Home / Self Care | Payer: Medicare Other | Source: Ambulatory Visit | Attending: Sports Medicine | Admitting: Sports Medicine

## 2013-05-18 DIAGNOSIS — M4800 Spinal stenosis, site unspecified: Secondary | ICD-10-CM

## 2013-05-18 MED ORDER — METHYLPREDNISOLONE ACETATE 40 MG/ML INJ SUSP (RADIOLOG
120.0000 mg | Freq: Once | INTRAMUSCULAR | Status: AC
  Administered 2013-05-18: 120 mg via EPIDURAL

## 2013-05-18 MED ORDER — IOHEXOL 180 MG/ML  SOLN
1.0000 mL | Freq: Once | INTRAMUSCULAR | Status: AC | PRN
  Administered 2013-05-18: 1 mL via EPIDURAL

## 2013-07-16 ENCOUNTER — Other Ambulatory Visit: Payer: Self-pay

## 2013-07-16 MED ORDER — ALPRAZOLAM 0.5 MG PO TABS: 0.2500 mg | ORAL_TABLET | Freq: Two times a day (BID) | ORAL | Status: DC | PRN

## 2013-07-16 NOTE — Telephone Encounter (Signed)
Done hardcopy to robin  

## 2013-07-17 NOTE — Telephone Encounter (Signed)
Faxed hardcopy to [facility] 

## 2013-08-10 ENCOUNTER — Other Ambulatory Visit: Payer: Self-pay | Admitting: Internal Medicine

## 2013-09-23 ENCOUNTER — Ambulatory Visit (INDEPENDENT_AMBULATORY_CARE_PROVIDER_SITE_OTHER): Payer: Medicare Other | Admitting: Internal Medicine

## 2013-09-23 ENCOUNTER — Encounter: Payer: Self-pay | Admitting: Internal Medicine

## 2013-09-23 VITALS — BP 150/90 | HR 77 | Temp 99.2°F | Wt 168.8 lb

## 2013-09-23 DIAGNOSIS — Z23 Encounter for immunization: Secondary | ICD-10-CM

## 2013-09-23 DIAGNOSIS — R202 Paresthesia of skin: Secondary | ICD-10-CM

## 2013-09-23 DIAGNOSIS — F519 Sleep disorder not due to a substance or known physiological condition, unspecified: Secondary | ICD-10-CM

## 2013-09-23 DIAGNOSIS — R209 Unspecified disturbances of skin sensation: Secondary | ICD-10-CM

## 2013-09-23 NOTE — Patient Instructions (Addendum)
1) Difficulty sleeping: Recommend improving sleep hygiene.  Sleep is a learned or unlearned behavior. 5 principles of sleep hygiene - 1) regular hour to retire and rise 7days/wk 2) no stimulants - caffeine, chocolat, alcohol, 3) regular exercise  - every afternoon  4) sleep sanctuary - a space that is right light, temperature, sound level, good bed where all you do is sleep. 5) No extinction behaviors, e.g. Laying in bed awake doing anything but sleeping. This means if you have a bad night - no naps, etc  You can drink warm milk as needed before bed to help sleep. Additionally, you can take melatonin (over the counter) if no improvement after 3 - 4 weeks upon initiation of sleep hygiene regimen.   2) Leg Pain: Leg pain is likely the result of your spinal stenosis. Plan to increase your gabapentin to 200 mg three times a day.   3) You received a pneumoniae vaccine today.   3) Schedule a follow up appointment for 3 - 4 weeks.

## 2013-09-23 NOTE — Progress Notes (Signed)
   Subjective:    Patient ID: Alexandria Beck, female    DOB: 1931-04-28, 79 y.o.   MRN: [id number]  HPI    Review of Systems     Objective:   Physical Exam        Assessment & Plan:

## 2013-09-23 NOTE — Progress Notes (Signed)
Pre visit review using our clinic review tool, if applicable. No additional management support is needed unless otherwise documented below in the visit note. 

## 2013-09-23 NOTE — Progress Notes (Signed)
Subjective:     Patient ID: Alexandria Beck, female   DOB: 1930/12/15, 79 y.o.   MRN: [id number]  HPI Alexandria Beck is an 79 year old female with PMH of HTN and degenerative disc disease who presents today for lower leg tingling/pain and insomnia. She was last seen in June of this year for the same problem since that time her pain has improved. She had an MRI in July which showed worsening spinal stenosis and progression of her degenerative disc disease at L5 -S1 without neural impingement. She received a steroid injection in L5-S1 in August, which improved her pain. The tingling and numbness are intermittent and no longer constant as they were earlier. The pain is burning in character and extends from the back of her thigh to her calf. Her foot is not involved. Her pain is more intense on her left side. Heavy lifting aggravates her pain.   Alexandria Beck also complains of difficulty sleeping. She normally gets to sleep around 12:30 - 1 when she gets in bed around 10. She regularly reads in bed while waiting to feel sleepy. She takes benadryl PRN to help her improve her sleep in addition to improve her allergies. She has vivid dreams in which she is doing activities with her deceased family members. The dreams with her family members occur regularly; however, the dreams are never identical . When she wakes up from the dreams, she feels her heart is racing fast.She sleeps about 5 hours until these dreams wake her up.   She stopped taking aleve, which helped stop the frequency of dreams.   Past Medical History  Diagnosis Date  . Near syncope     diagnosed as anxiety related. gets some relief with clonazepam. No formal testing done.  [name] GERD (gastroesophageal reflux disease)     has had EGD 2006-negative  . Allergy     seasonal  . Irregular heart beat     no testing, holter, etc. Takes no medication  . Hypertension      On no medication. Home readings SBP=150's  . Diverticulosis     2004  . Internal hemorrhoids    . Esophageal stricture    Past Surgical History  Procedure Laterality Date  . Abdominal hysterectomy  1974    fibroid tumors w/ metorrhagia  . Tonsillectomy    . G4p4 - nsvd  midwife delivery for two    . Tonsillectomy     Family History  Problem Relation Age of Onset  . Heart disease Mother   . Stroke Mother   . Hypertension Mother   . Cancer Father     lung cancer - smoker  . Heart disease Sister     MI  . Kidney disease Sister     end stage renal disease on HD  . Diabetes Sister   . Heart disease Brother   . Diabetes Sister   . Hypertension Sister   . Kidney disease Sister   . Cancer Sister   . Diabetes Sister   . Kidney disease Sister     HD  . Hypertension Sister   . Heart disease Sister   . Hypertension Sister   . Heart disease Sister   . Hypertension Sister   . Hypertension Sister    History   Social History  . Marital Status: Widowed    Spouse Name: N/A    Number of Children: 4  . Years of Education: 11   Occupational History  . stock person  retired   Social History Main Topics  . Smoking status: Never Smoker   . Smokeless tobacco: Never Used  . Alcohol Use: No  . Drug Use: No  . Sexual Activity: Not Currently   Other Topics Concern  . Not on file   Social History Narrative   11th grade. Married '52- '92/widowed. 2 sons- '55, '63; 2 dtrs - '54, '60. 12 grandchildren. 7 greatgrands. Lives in own home with a son who is in and out. Work - [employer], Psychologist, prison and probation services.      Do you drink Alcoholic Beverages-No. Do you drink caffienated Beverages- No. Do you use seatbelt often- Yes. Do you exercise at least 3 times a week- . Do you take vitamins- . Do you use a hearing aid- No. Do you wear dentures- Yes. Is there a smoke alarm in your house- Yes. Are there guns and firearms in your household- . Have you experienced physical abuse- No.Usual # of hours of sleep per night 3. Number of people living at your residence- 2                          Current Outpatient Prescriptions on File Prior to Visit  Medication Sig Dispense Refill  . ALPRAZolam (XANAX) 0.5 MG tablet Take 0.5-1 tablets (0.25-0.5 mg total) by mouth 2 (two) times daily as needed for sleep or anxiety.  60 tablet  1  . diphenhydrAMINE (BENADRYL) 50 MG capsule TAKE ONE CAPSULE BY MOUTH EVERY 6 HOURS AS NEEDED  100 capsule  0  . gabapentin (NEURONTIN) 100 MG capsule Take 1 capsule (100 mg total) by mouth 3 (three) times daily.  90 capsule  3  . Multiple Vitamin (MULTIVITAMIN WITH MINERALS) TABS Take 1 tablet by mouth daily.      . vitamin C (ASCORBIC ACID) 500 MG tablet Take 500 mg by mouth daily.      [name] zolpidem (AMBIEN) 5 MG tablet Take 1 tablet (5 mg total) by mouth at bedtime as needed for sleep.  30 tablet  0  . esomeprazole (NEXIUM) 40 MG capsule Take 1 capsule (40 mg total) by mouth daily before breakfast.  30 capsule  11   No current facility-administered medications on file prior to visit.   Imaging MRI Spine July 2014: IMPRESSION:  1. Interval worsening of spinal stenosis, spondylolisthesis, and  facet arthritis at L4-5 with severe spinal stenosis and lateral  recess stenosis.  2. Progressive degenerative disc disease at L5-S1 without neural  impingement.  Review of Systems  She has occasional tinnitus in her R ear.   She reports dry eyes and occasional difficulty swallowing.  Objective:   Physical Exam HEENT: MMM. Pupils equal and reactive to light bilaterally. Normal tympanic membranes. No thyromegaly. No lymphadenopathy.  CV: Regular rate and rhythm. Radial and dorsalis pedis pulses 2+ bilaterally. No peripheral edema.  Pulm: Normal work of breathing. Lungs clear to auscultation bilaterally.  Abd: Soft, non-distended, non-tender to palpation.  Neuro: Intact sensation to pin-prick and cotton swab bilaterally. Normal vibration sensation in lower extremities. Strength 5/5 in lower extremities. Normal 2+ patellar and achilles reflexes bilaterally.        Assessment and Plan:    Alexandria Beck is an 80 year old female with PMH of HTN and degenerative disc disease who presents today for insomnia and lower leg tingling/pain.   1) Insomnia:  Ms. Morrissette was educated regarding sleep hygiene using the following 5 rules of sleep.  Sleep is a learned  or unlearned behavior. 5 principles of sleep hygiene - 1) regular hour to retire and rise 7days/wk 2) no stimulants - caffeine, chocolat, alcohol, 3) regular exercise  - every afternoon  4) sleep sanctuary - a space that is right light, temperature, sound level, good bed where all you do is sleep. 5) No extinction behaviors, e.g. Laying in bed awake doing anything but sleeping. This means if you have a bad night - no naps, etc Advised against use of benadryl. Recommended use of warm milk or camel tea to improve sleep. Suggested trying melatonin in a month if no improvements of symptoms with improved sleep hygiene.   2) Bilateral leg pain: Her symptoms are consistent with neuropathic pain due to her worsening spinal stenosis as seen on MRI earlier this year.   Increase gabapentin to 200 mg TID from 100 mg TID. Continue to monitor if symptoms improve.   3) Health maintenance: Ms. Ausley received her prevnar vaccine today.   3) Follow up in 3 - 4 weeks for annual physical.

## 2013-09-26 DIAGNOSIS — F519 Sleep disorder not due to a substance or known physiological condition, unspecified: Secondary | ICD-10-CM | POA: Insufficient documentation

## 2013-09-26 NOTE — Assessment & Plan Note (Signed)
Bilateral leg pain: Her symptoms are consistent with neuropathic pain due to her worsening spinal stenosis as seen on MRI earlier this year.   Increase gabapentin to 200 mg TID from 100 mg TID. Continue to monitor if symptoms improve.

## 2013-09-26 NOTE — Assessment & Plan Note (Signed)
Sleep latency insomnia: Alexandria Beck was educated regarding sleep hygiene using the following 5 rules of sleep. Advised against use of benadryl. Recommended use of warm milk or camomile tea to improve sleep. Suggested trying melatonin in a month if no improvements of symptoms with improved sleep hygiene.

## 2013-10-13 ENCOUNTER — Other Ambulatory Visit (INDEPENDENT_AMBULATORY_CARE_PROVIDER_SITE_OTHER): Payer: Medicare Other

## 2013-10-13 ENCOUNTER — Encounter: Payer: Self-pay | Admitting: Internal Medicine

## 2013-10-13 ENCOUNTER — Ambulatory Visit (INDEPENDENT_AMBULATORY_CARE_PROVIDER_SITE_OTHER): Payer: Medicare Other | Admitting: Internal Medicine

## 2013-10-13 VITALS — BP 150/100 | HR 79 | Temp 98.3°F | Wt 168.0 lb

## 2013-10-13 DIAGNOSIS — R209 Unspecified disturbances of skin sensation: Secondary | ICD-10-CM

## 2013-10-13 DIAGNOSIS — G8929 Other chronic pain: Secondary | ICD-10-CM

## 2013-10-13 DIAGNOSIS — M549 Dorsalgia, unspecified: Secondary | ICD-10-CM

## 2013-10-13 DIAGNOSIS — Z0000 Encounter for general adult medical examination without abnormal findings: Secondary | ICD-10-CM

## 2013-10-13 DIAGNOSIS — R202 Paresthesia of skin: Secondary | ICD-10-CM

## 2013-10-13 DIAGNOSIS — I10 Essential (primary) hypertension: Secondary | ICD-10-CM

## 2013-10-13 DIAGNOSIS — F519 Sleep disorder not due to a substance or known physiological condition, unspecified: Secondary | ICD-10-CM

## 2013-10-13 DIAGNOSIS — E785 Hyperlipidemia, unspecified: Secondary | ICD-10-CM

## 2013-10-13 LAB — COMPREHENSIVE METABOLIC PANEL
Alkaline Phosphatase: 58 U/L (ref 39–117)
Glucose, Bld: 90 mg/dL (ref 70–99)
Sodium: 138 mEq/L (ref 135–145)
Total Bilirubin: 0.5 mg/dL (ref 0.3–1.2)
Total Protein: 7.5 g/dL (ref 6.0–8.3)

## 2013-10-13 LAB — LIPID PANEL
Cholesterol: 210 mg/dL — ABNORMAL HIGH (ref 0–200)
HDL: 56.2 mg/dL (ref >39.00)
Total CHOL/HDL Ratio: 4
VLDL: 14.4 mg/dL (ref 0.0–40.0)

## 2013-10-13 LAB — VITAMIN B12: Vitamin B-12: 984 pg/mL — ABNORMAL HIGH (ref 211–911)

## 2013-10-13 LAB — TSH: TSH: 1.34 u[IU]/mL (ref 0.35–5.50)

## 2013-10-13 LAB — LDL CHOLESTEROL, DIRECT: Direct LDL: 154.5 mg/dL

## 2013-10-13 MED ORDER — FUROSEMIDE 20 MG PO TABS: 20.0000 mg | ORAL_TABLET | Freq: Every day | ORAL | Status: DC

## 2013-10-13 MED ORDER — GABAPENTIN 100 MG PO CAPS: 200.0000 mg | ORAL_CAPSULE | Freq: Three times a day (TID) | ORAL | Status: DC

## 2013-10-13 NOTE — Assessment & Plan Note (Signed)
Chronic low back pain - not too bad Plan Take tylenol (generic acetominophen) 500 mg tablet, 2 tablets at bedtime

## 2013-10-13 NOTE — Progress Notes (Signed)
Pre visit review using our clinic review tool, if applicable. No additional management support is needed unless otherwise documented below in the visit note. 

## 2013-10-13 NOTE — Assessment & Plan Note (Signed)
Sleep - the zolpidem won't be covered Plan Camomile tea with milk at bedtime and the lessons about sleep hygiene we discussed at your last visit.

## 2013-10-13 NOTE — Assessment & Plan Note (Signed)
Cholesterol   Was a little above normal last year Plan Repeat cholesterol test with recommendations to follow.

## 2013-10-13 NOTE — Progress Notes (Signed)
Subjective:    Patient ID: Alexandria Beck, female    DOB: 08-Apr-1931, 79 y.o.   MRN: [id number]  HPI The patient is here for annual Medicare wellness examination and management of other chronic and acute problems.  Alexandria Beck was seen abvout 4 weeks ago. She has been doing OK. She is sleeping a little better. The increased gabapentin (200 mg tid) is helping her legs.    The risk factors are reflected in the social history.  The roster of all physicians providing medical care to patient - is listed in the Snapshot section of the chart.  Activities of daily living:  The patient is 100% inedpendent in all ADLs: dressing, toileting, feeding as well as independent mobility  Home safety : The patient has smoke detectors in the home. Fall - no falls. They wear seatbelts. No firearms at home  There is no violence in the home.   There is no risks for hepatitis, STDs or HIV. There is no   history of blood transfusion. They have no travel history to infectious disease endemic areas of the world.  The patient has not seen their dentist in the last six month , has full dentures. They have seen their eye doctor in the last year. They deny any hearing difficulty and have not had audiologic testing in the last year.    They do not  have excessive sun exposure. Discussed the need for sun protection: hats, long sleeves and use of sunscreen if there is significant sun exposure.   Diet: the importance of a healthy diet is discussed. They do have a healthy diet most of the time.  The patient has no regular exercise program.  The benefits of regular aerobic exercise were discussed.  Depression screen: there are no signs or vegative symptoms of depression- irritability, change in appetite, anhedonia, sadness/tearfullness. She does have some loneliness but she will reach out.   Cognitive assessment: the patient manages all their financial and personal affairs and is actively engaged.   The following portions  of the patient's history were reviewed and updated as appropriate: allergies, current medications, past family history, past medical history,  past surgical history, past social history  and problem list.  Vision, hearing, body mass index were assessed and reviewed.   During the course of the visit the patient was educated and counseled about appropriate screening and preventive services including : fall prevention , diabetes screening, nutrition counseling, colorectal cancer screening, and recommended immunizations.  Past Medical History  Diagnosis Date  . Near syncope     diagnosed as anxiety related. gets some relief with clonazepam. No formal testing done.  [name] GERD (gastroesophageal reflux disease)     has had EGD 2006-negative  . Allergy     seasonal  . Irregular heart beat     no testing, holter, etc. Takes no medication  . Hypertension      On no medication. Home readings SBP=150's  . Diverticulosis     2004  . Internal hemorrhoids   . Esophageal stricture    Past Surgical History  Procedure Laterality Date  . Abdominal hysterectomy  1974    fibroid tumors w/ metorrhagia  . Tonsillectomy    . G4p4 - nsvd  midwife delivery for two    . Tonsillectomy     Family History  Problem Relation Age of Onset  . Heart disease Mother   . Stroke Mother   . Hypertension Mother   . Cancer Father  lung cancer - smoker  . Heart disease Sister     MI  . Kidney disease Sister     end stage renal disease on HD  . Diabetes Sister   . Heart disease Brother   . Diabetes Sister   . Hypertension Sister   . Kidney disease Sister   . Cancer Sister   . Diabetes Sister   . Kidney disease Sister     HD  . Hypertension Sister   . Heart disease Sister   . Hypertension Sister   . Heart disease Sister   . Hypertension Sister   . Hypertension Sister    History   Social History  . Marital Status: Widowed    Spouse Name: N/A    Number of Children: 4  . Years of Education: 11    Occupational History  . stock person     retired   Social History Main Topics  . Smoking status: Never Smoker   . Smokeless tobacco: Never Used  . Alcohol Use: No  . Drug Use: No  . Sexual Activity: Not Currently   Other Topics Concern  . Not on file   Social History Narrative   11th grade. Married '52- '92/widowed. 2 sons- '55, '63; 2 dtrs - '54, '60. 12 grandchildren. 7 greatgrands. Lives in own home with a son who is in and out. Work - [employer], Psychologist, prison and probation services.      Do you drink Alcoholic Beverages-No. Do you drink caffienated Beverages- No. Do you use seatbelt often- Yes. Do you exercise at least 3 times a week- . Do you take vitamins- . Do you use a hearing aid- No. Do you wear dentures- Yes. Is there a smoke alarm in your house- Yes. Are there guns and firearms in your household- . Have you experienced physical abuse- No.Usual # of hours of sleep per night 3. Number of people living at your residence- 2                         Current Outpatient Prescriptions on File Prior to Visit  Medication Sig Dispense Refill  . ALPRAZolam (XANAX) 0.5 MG tablet Take 0.5-1 tablets (0.25-0.5 mg total) by mouth 2 (two) times daily as needed for sleep or anxiety.  60 tablet  1  . diphenhydrAMINE (BENADRYL) 50 MG capsule TAKE ONE CAPSULE BY MOUTH EVERY 6 HOURS AS NEEDED  100 capsule  0  . Multiple Vitamin (MULTIVITAMIN WITH MINERALS) TABS Take 1 tablet by mouth daily.      . vitamin C (ASCORBIC ACID) 500 MG tablet Take 500 mg by mouth daily.      [name] zolpidem (AMBIEN) 5 MG tablet Take 1 tablet (5 mg total) by mouth at bedtime as needed for sleep.  30 tablet  0  . esomeprazole (NEXIUM) 40 MG capsule Take 1 capsule (40 mg total) by mouth daily before breakfast.  30 capsule  11   No current facility-administered medications on file prior to visit.      Review of Systems Constitutional:  Negative for fever, chills, activity change and unexpected weight change.  HEENT:  Negative for hearing  loss, ear pain, congestion, neck stiffness and postnasal drip. Negative for sore throat or swallowing problems. Negative for dental complaints.   Eyes: Negative for vision loss or change in visual acuity.  Respiratory: Negative for chest tightness and wheezing. Negative for DOE.   Cardiovascular: Negative for chest pain or palpitations. No decreased exercise tolerance Gastrointestinal: No  change in bowel habit. No bloating or gas. No reflux or indigestion Genitourinary: Negative for urgency, frequency, flank pain and difficulty urinating.  Musculoskeletal: Negative for myalgias, back pain, arthralgias and gait problem.  Neurological: Negative for dizziness, tremors, weakness and headaches. Some knee pain - mild sciatica. Mild chronic back pain Hematological: Negative for adenopathy.  Psychiatric/Behavioral: Negative for behavioral problems and dysphoric mood.       Objective:   Physical Exam Filed Vitals:   10/13/13 0839  BP: 150/100  Pulse: 79  Temp: 98.3 F (36.8 C)   Wt Readings from Last 3 Encounters:  10/13/13 168 lb (76.204 kg)  09/23/13 168 lb 12.8 oz (76.567 kg)  04/27/13 164 lb 6.4 oz (74.571 kg)   BP Readings from Last 3 Encounters:  10/13/13 150/100  09/23/13 150/90  05/18/13 157/85   Gen'l- WNWD elderly woman in no distress HEENT- C&S clear Cor 2+ radial, RRR II/VI soft mm best at RSB Pulm - lungs CTAP Neuro - A&O x 3       Assessment & Plan:

## 2013-10-13 NOTE — Patient Instructions (Addendum)
Happy Holidays and good to see you.  Blood pressure: this has been high the last 3 visits. Plan Furosemide 20 mg (a water pill) once a day in the AM  Return for Blood pressure check in 3-4 weeks  Leg pain - the gabapentin seems to help Plan Continue 200 mg three times a day (new Rx sent to drugstore)  Sleep - the zolpidem won't be covered Plan Camomile tea with milk at bedtime and the lessons about sleep hygiene we discussed at your last visit.  Chronic low back pain - not too bad Plan Take tylenol (00 mg tablet, 2 tablets at bedtimegeneric acetominophen)   Cholesterol   Was a little above normal last year Plan Repeat cholesterol test with recommendations to follow.  You will be notified by letter of the lab results along with any recommendations.  Merry christmas!!

## 2013-10-14 DIAGNOSIS — Z0000 Encounter for general adult medical examination without abnormal findings: Secondary | ICD-10-CM | POA: Insufficient documentation

## 2013-10-14 NOTE — Assessment & Plan Note (Addendum)
BP Readings from Last 3 Encounters:  10/13/13 150/100  09/23/13 150/90  05/18/13 157/85    Blood pressure: this has been high the last 3 visits. Plan Furosemide 20 mg (a water pill) once a day in the AM  Return for Blood pressure check in 3-4

## 2013-10-14 NOTE — Assessment & Plan Note (Signed)
Interval history w/o acute problems with reasonably well controlled chronic problems. Limited physical exam OK. Labs reviewed. She is aged out from colorectal cancer screening and is advised to have a mammogram in 2015. Immuniations - had Prevnar today and will return for pneumovax; she will check coverage for shingles vaccine; no need for tetanus.   In summary A nice woman who does appear medically stable. Reviewed ACP with her - confirmed position stated in Social History.

## 2013-10-14 NOTE — Assessment & Plan Note (Signed)
Leg pain - the gabapentin seems to help Plan Continue 200 mg three times a day (new Rx sent to drugstore)

## 2013-10-16 ENCOUNTER — Encounter: Payer: Self-pay | Admitting: Internal Medicine

## 2013-11-11 ENCOUNTER — Telehealth: Payer: Self-pay | Admitting: Gastroenterology

## 2013-11-11 NOTE — Telephone Encounter (Signed)
Pt states she is having problems with burning in her throat and difficulty swallowing. Pt scheduled to see Alonza Bogus PA tomorrow at 10am. Pt aware of appt.

## 2013-11-12 ENCOUNTER — Encounter: Payer: Self-pay | Admitting: Gastroenterology

## 2013-11-12 ENCOUNTER — Ambulatory Visit (INDEPENDENT_AMBULATORY_CARE_PROVIDER_SITE_OTHER): Payer: Medicare Other | Admitting: Gastroenterology

## 2013-11-12 ENCOUNTER — Other Ambulatory Visit: Payer: Self-pay | Admitting: Internal Medicine

## 2013-11-12 VITALS — BP 142/92 | HR 96 | Ht 61.5 in | Wt 167.4 lb

## 2013-11-12 DIAGNOSIS — R1310 Dysphagia, unspecified: Secondary | ICD-10-CM | POA: Insufficient documentation

## 2013-11-12 MED ORDER — ESOMEPRAZOLE MAGNESIUM 40 MG PO CPDR: 40.0000 mg | DELAYED_RELEASE_CAPSULE | Freq: Every day | ORAL | Status: DC

## 2013-11-12 NOTE — Patient Instructions (Signed)
We have sent the following medications to your pharmacy for you to pick up at your convenience: Nexium 40 mg take 30 minutes prior to your first meal of the day.  You have been scheduled for a Barium Esophogram at [facility] (1st floor of the hospital) on 11/16/2013 at 10:30. Please arrive 15 minutes prior to your appointment for registration. Make certain not to have anything to eat or drink 6 hours prior to your test. If you need to reschedule for any reason, please contact radiology at [phone] to do so. __________________________________________________________________ A barium swallow is an examination that concentrates on views of the esophagus. This tends to be a double contrast exam (barium and two liquids which, when combined, create a gas to distend the wall of the oesophagus) or single contrast (non-ionic iodine based). The study is usually tailored to your symptoms so a good history is essential. Attention is paid during the study to the form, structure and configuration of the esophagus, looking for functional disorders (such as aspiration, dysphagia, achalasia, motility and reflux) EXAMINATION You may be asked to change into a gown, depending on the type of swallow being performed. A radiologist and radiographer will perform the procedure. The radiologist will advise you of the type of contrast selected for your procedure and direct you during the exam. You will be asked to stand, sit or lie in several different positions and to hold a small amount of fluid in your mouth before being asked to swallow while the imaging is performed .In some instances you may be asked to swallow barium coated marshmallows to assess the motility of a solid food bolus. The exam can be recorded as a digital or video fluoroscopy procedure. POST PROCEDURE It will take 1-2 days for the barium to pass through your system. To facilitate this, it is important, unless otherwise directed, to increase your  fluids for the next 24-48hrs and to resume your normal diet.  This test typically takes about 30 minutes to perform. __________________________________________________________________________________

## 2013-11-12 NOTE — Progress Notes (Signed)
11/12/2013 Alexandria Beck [id number] Jun 29, 1931   History of Present Illness:  Patient is a pleasant 80 year old female who is known to Dr. Deatra Ina for previous complaints of dysphagia.  She had an EGD in 11/2011 at which time she had a stricture at the GE junction that was dilated with [facility] dilator.  She comes in today with complaints of swallowing issues, but does not describe dysphagia with food getting stuck.  She says that when she swallows if feels like the food, etc is only going down one side rather than going down the middle of the esophagus.  Says that it feels like it is "swollen" on the right side.  This has been occurring for approximately one month or so.  She also complains of increase heartburn at night for the past couple of weeks, but has been out of her Nexium for a couple of weeks (needs a new prescription) as well.  Has been taking Zantac 150 mg, but that does not seem to be helping much.   Current Medications, Allergies, Past Medical History, Past Surgical History, Family History and Social History were reviewed in [employer] record.   Physical Exam: BP 142/92  Pulse 96  Ht 5' 1.5" (1.562 m)  Wt 167 lb 6.4 oz (75.932 kg)  BMI 31.12 kg/m2 General: Well developed female in no acute distress Head: Normocephalic and atraumatic Eyes:  Sclerae anicteric, conjunctiva pink  Ears: Normal auditory acuity Mouth:  MMM.  No lesion seen. Neck:  No lymphadenopathy, swelling, or masses felt. Lungs: Clear throughout to auscultation. Heart: Regular rate and rhythm. Abdomen: Soft, non-distended.  BS present.  Minimal epigastric TTP without R/R/G. Musculoskeletal: Symmetrical with no gross deformities.  Extremities: No edema  Neurological: Alert oriented x 4, grossly non-focal Psychological:  Alert and cooperative. Normal mood and affect  Assessment and Recommendations: -Dysphagia:  Not classic symptoms of stricture and food getting stuck.  Says it feels like  "stuff goes down only one side" rather than going down the middle of the esophagus.  Will schedule barium esophagram to rule out extrinsic compression and other issues.  Needs to restart her Nexium 40 mg daily; will send a prescription.  Can take Zantac 150 mg at bedtime for now as well.

## 2013-11-12 NOTE — Telephone Encounter (Signed)
Alprazolam script has been faxed

## 2013-11-13 NOTE — Progress Notes (Signed)
Reviewed and agree with management. Doninique Lwin D. Johnay Mano, M.D., FACG  

## 2013-11-16 ENCOUNTER — Ambulatory Visit (HOSPITAL_COMMUNITY)
Admission: RE | Admit: 2013-11-16 | Discharge: 2013-11-16 | Disposition: A | Discharge status: Home / Self Care | Payer: Medicare Other | Source: Ambulatory Visit | Attending: Gastroenterology | Admitting: Gastroenterology

## 2013-11-16 DIAGNOSIS — K2289 Other specified disease of esophagus: Secondary | ICD-10-CM | POA: Insufficient documentation

## 2013-11-16 DIAGNOSIS — R1310 Dysphagia, unspecified: Secondary | ICD-10-CM | POA: Insufficient documentation

## 2013-11-16 DIAGNOSIS — K228 Other specified diseases of esophagus: Secondary | ICD-10-CM | POA: Insufficient documentation

## 2013-12-08 ENCOUNTER — Other Ambulatory Visit: Payer: Self-pay

## 2013-12-08 DIAGNOSIS — Z1231 Encounter for screening mammogram for malignant neoplasm of breast: Secondary | ICD-10-CM

## 2013-12-16 ENCOUNTER — Encounter (HOSPITAL_COMMUNITY): Payer: Self-pay | Admitting: Emergency Medicine

## 2013-12-16 ENCOUNTER — Emergency Department (HOSPITAL_COMMUNITY): Payer: Medicare Other

## 2013-12-16 ENCOUNTER — Emergency Department (INDEPENDENT_AMBULATORY_CARE_PROVIDER_SITE_OTHER)
Admission: EM | Admit: 2013-12-16 | Discharge: 2013-12-16 | Disposition: A | Discharge status: Home / Self Care | Payer: Medicare Other | Source: Home / Self Care | Attending: Emergency Medicine | Admitting: Emergency Medicine

## 2013-12-16 ENCOUNTER — Emergency Department (HOSPITAL_COMMUNITY)
Admission: EM | Admit: 2013-12-16 | Discharge: 2013-12-16 | Disposition: A | Discharge status: Home / Self Care | Payer: Medicare Other | Attending: Emergency Medicine | Admitting: Emergency Medicine

## 2013-12-16 DIAGNOSIS — R0600 Dyspnea, unspecified: Secondary | ICD-10-CM

## 2013-12-16 DIAGNOSIS — Z79899 Other long term (current) drug therapy: Secondary | ICD-10-CM | POA: Insufficient documentation

## 2013-12-16 DIAGNOSIS — K219 Gastro-esophageal reflux disease without esophagitis: Secondary | ICD-10-CM | POA: Insufficient documentation

## 2013-12-16 DIAGNOSIS — I10 Essential (primary) hypertension: Secondary | ICD-10-CM | POA: Insufficient documentation

## 2013-12-16 DIAGNOSIS — R0609 Other forms of dyspnea: Secondary | ICD-10-CM

## 2013-12-16 DIAGNOSIS — R002 Palpitations: Secondary | ICD-10-CM | POA: Insufficient documentation

## 2013-12-16 DIAGNOSIS — G5600 Carpal tunnel syndrome, unspecified upper limb: Secondary | ICD-10-CM

## 2013-12-16 DIAGNOSIS — R0989 Other specified symptoms and signs involving the circulatory and respiratory systems: Secondary | ICD-10-CM

## 2013-12-16 LAB — CBC WITH DIFFERENTIAL/PLATELET
Basophils Absolute: 0 10*3/uL (ref 0.0–0.1)
Basophils Relative: 0 % (ref 0–1)
EOS PCT: 2 % (ref 0–5)
Eosinophils Absolute: 0.1 10*3/uL (ref 0.0–0.7)
HEMATOCRIT: 38.2 % (ref 36.0–46.0)
HEMOGLOBIN: 13.1 g/dL (ref 12.0–15.0)
LYMPHS ABS: 2.9 10*3/uL (ref 0.7–4.0)
LYMPHS PCT: 41 % (ref 12–46)
MCH: 31 pg (ref 26.0–34.0)
MCHC: 34.3 g/dL (ref 30.0–36.0)
MCV: 90.5 fL (ref 78.0–100.0)
MONO ABS: 0.4 10*3/uL (ref 0.1–1.0)
MONOS PCT: 5 % (ref 3–12)
Neutro Abs: 3.7 10*3/uL (ref 1.7–7.7)
Neutrophils Relative %: 52 % (ref 43–77)
Platelets: 315 10*3/uL (ref 150–400)
RBC: 4.22 MIL/uL (ref 3.87–5.11)
RDW: 13.1 % (ref 11.5–15.5)
WBC: 7.2 10*3/uL (ref 4.0–10.5)

## 2013-12-16 LAB — BASIC METABOLIC PANEL
BUN: 8 mg/dL (ref 6–23)
CALCIUM: 9.8 mg/dL (ref 8.4–10.5)
CO2: 24 meq/L (ref 19–32)
CREATININE: 0.81 mg/dL (ref 0.50–1.10)
Chloride: 106 mEq/L (ref 96–112)
GFR calc Af Amer: 77 mL/min — ABNORMAL LOW (ref >90)
GFR calc non Af Amer: 67 mL/min — ABNORMAL LOW (ref >90)
Glucose, Bld: 92 mg/dL (ref 70–99)
Potassium: 3.7 mEq/L (ref 3.7–5.3)
Sodium: 146 mEq/L (ref 137–147)

## 2013-12-16 LAB — TROPONIN I: Troponin I: <0.3 ng/mL (ref <0.30)

## 2013-12-16 LAB — PRO B NATRIURETIC PEPTIDE: Pro B Natriuretic peptide (BNP): 62.5 pg/mL (ref 0–450)

## 2013-12-16 MED ORDER — SODIUM CHLORIDE 0.9 % IV SOLN
INTRAVENOUS | Status: DC
  Administered 2013-12-16 (×2): via INTRAVENOUS

## 2013-12-16 NOTE — ED Notes (Signed)
C/o 1 week or so of episodic rapid HR, w associated SOB, fatigue; denies syx at present, NAD

## 2013-12-16 NOTE — ED Notes (Signed)
IV started left hand; O @ 2 L/ min, nasal cannula. [place] in main E , CN, advised of pending transfer via [facility]

## 2013-12-16 NOTE — ED Notes (Signed)
Report to [facility]  

## 2013-12-16 NOTE — ED Notes (Signed)
PER EMS: Pt. Is a transfer from [facility] due to c/o SOb and feeling like her heart has been racing for one week. Pt. Has not had any chest pain, fevers, or n/v/d.

## 2013-12-16 NOTE — ED Provider Notes (Signed)
CSN: [id number]     Arrival date & time 12/16/13  1051 History   First MD Initiated Contact with Patient 12/16/13 1054     Chief Complaint  Patient presents with  . Shortness of Breath     (Consider location/radiation/quality/duration/timing/severity/associated sxs/prior Treatment) HPI Patient reports intermittent palpitations over the past week.  She has had some shortness of breath.  She denies chest tightness.  No nausea vomiting or diarrhea.  No fevers or chills.  No prior history of atrial fibrillation or atrial flutter.  No prior workup for palpitations.  No prior history of cardiac disease.  She denies active chest pain or palpitations at this time.  She initially went to urgent care because she was having numbness and tingling of her left forearm intermittently over the past several weeks.  The history was obtained and sent to the ER given complaint of palpitations and shortness of breath.   Past Medical History  Diagnosis Date  . Near syncope     diagnosed as anxiety related. gets some relief with clonazepam. No formal testing done.  [name] GERD (gastroesophageal reflux disease)     has had EGD 2006-negative  . Allergy     seasonal  . Irregular heart beat     no testing, holter, etc. Takes no medication  . Hypertension      On no medication. Home readings SBP=150's  . Diverticulosis     2004  . Internal hemorrhoids   . Esophageal stricture    Past Surgical History  Procedure Laterality Date  . Abdominal hysterectomy  1974    fibroid tumors w/ metorrhagia  . Tonsillectomy    . G4p4 - nsvd  midwife delivery for two    . Tonsillectomy     Family History  Problem Relation Age of Onset  . Heart disease Mother   . Stroke Mother   . Hypertension Mother   . Cancer Father     lung cancer - smoker  . Heart disease Sister     MI  . Kidney disease Sister     end stage renal disease on HD  . Diabetes Sister   . Heart disease Brother   . Diabetes Sister   . Hypertension  Sister   . Kidney disease Sister   . Cancer Sister   . Diabetes Sister   . Kidney disease Sister     HD  . Hypertension Sister   . Heart disease Sister   . Hypertension Sister   . Heart disease Sister   . Hypertension Sister   . Hypertension Sister    History  Substance Use Topics  . Smoking status: Never Smoker   . Smokeless tobacco: Never Used  . Alcohol Use: No   OB History   Grav Para Term Preterm Abortions TAB SAB Ect Mult Living                 Review of Systems  All other systems reviewed and are negative.      Allergies  Clonazepam; Codeine; and Aspirin  Home Medications   Current Outpatient Rx  Name  Route  Sig  Dispense  Refill  . ALPRAZolam (XANAX) 0.5 MG tablet   Oral   Take 0.5 mg by mouth 2 (two) times daily as needed for anxiety or sleep.         . diphenhydrAMINE (BENADRYL) 50 MG capsule   Oral   Take 50 mg by mouth every 6 (six) hours as needed for allergies.         [name]  diphenhydrAMINE (BENADRYL) 50 MG capsule      TAKE ONE CAPSULE BY MOUTH EVERY 6 HOURS AS NEEDED         . esomeprazole (NEXIUM) 40 MG capsule   Oral   Take 1 capsule (40 mg total) by mouth daily at 12 noon.   30 capsule   6   . furosemide (LASIX) 20 MG tablet   Oral   Take 1 tablet (20 mg total) by mouth daily.   30 tablet   3   . gabapentin (NEURONTIN) 100 MG capsule   Oral   Take 2 capsules (200 mg total) by mouth 3 (three) times daily.   180 capsule   3   . Multiple Vitamin (MULTIVITAMIN WITH MINERALS) TABS   Oral   Take 1 tablet by mouth daily.         . Olopatadine HCl 0.2 % SOLN   Ophthalmic   Apply 1 drop to eye 2 (two) times daily as needed (for dry eyes).         . sertraline (ZOLOFT) 25 MG tablet   Oral   Take 25 mg by mouth daily.         . vitamin C (ASCORBIC ACID) 500 MG tablet   Oral   Take 500 mg by mouth daily.         [name] zolpidem (AMBIEN) 5 MG tablet   Oral   Take 1 tablet (5 mg total) by mouth at bedtime as needed for  sleep.   30 tablet   0    BP 152/76  Pulse 77  Temp(Src) 98.1 F (36.7 C) (Oral)  Resp 20  Wt 164 lb (74.39 kg)  SpO2 99% Physical Exam  Nursing note and vitals reviewed. Constitutional: She is oriented to person, place, and time. She appears well-developed and well-nourished. No distress.  HENT:  Head: Normocephalic and atraumatic.  Eyes: EOM are normal. Pupils are equal, round, and reactive to light.  Neck: Normal range of motion.  Cardiovascular: Normal rate, regular rhythm and normal heart sounds.   Pulmonary/Chest: Effort normal and breath sounds normal.  Abdominal: Soft. She exhibits no distension. There is no tenderness.  Musculoskeletal: Normal range of motion.  Left upper extremity with normal grip strength in left hand and normal left radial and ulnar pulses.  No erythema or swelling noted of her left upper extremity.  No point tenderness  Neurological: She is alert and oriented to person, place, and time.  5/5 strength in major muscle groups of  bilateral upper and lower extremities. Speech normal. No facial asymetry.   Skin: Skin is warm and dry.  Psychiatric: She has a normal mood and affect. Judgment normal.    ED Course  Procedures (including critical care time) Labs Review Labs Reviewed  BASIC METABOLIC PANEL - Abnormal; Notable for the following:    GFR calc non Af Amer 67 (*)    GFR calc Af Amer 77 (*)    All other components within normal limits  CBC WITH DIFFERENTIAL  PRO B NATRIURETIC PEPTIDE  TROPONIN I   Imaging Review Dg Chest 2 View  12/16/2013   CLINICAL DATA:  Shortness of breath  EXAM: CHEST  2 VIEW  COMPARISON:  None.  FINDINGS: The heart size and mediastinal contours are within normal limits. Both lungs are clear. The visualized skeletal structures are unremarkable.  IMPRESSION: No active cardiopulmonary disease.   Electronically Signed   By: Alcide Clever M.D.   On: 12/16/2013 11:56  ECG interpretation   Date: 12/16/2013  Rate: 72   Rhythm: normal sinus rhythm  QRS Axis: normal  Intervals: normal  ST/T Wave abnormalities: normal  Conduction Disutrbances: none  Narrative Interpretation:   Old EKG Reviewed: No significant changes noted     MDM   Final diagnoses:  Palpitations    She'll followup with cardiology.patient for likely Holter monitoring for her transient palpitations.  EKG normal sinus rhythm in the ER.    Hoy Morn, MD 12/16/13 [phone]

## 2013-12-16 NOTE — Discharge Instructions (Signed)
We have determined that your problem requires further evaluation in the emergency department.  We will take care of your transport there.  Once at the emergency department, you will be evaluated by a provider and they will order whatever treatment or tests they deem necessary.  We cannot guarantee that they will do any specific test or do any specific treatment.  ° °

## 2013-12-16 NOTE — Discharge Instructions (Signed)
Palpitations   A palpitation is the feeling that your heartbeat is irregular or is faster than normal. It may feel like your heart is fluttering or skipping a beat. Palpitations are usually not a serious problem. However, in some cases, you may need further medical evaluation.  CAUSES   Palpitations can be caused by:   Smoking.   Caffeine or other stimulants, such as diet pills or energy drinks.   Alcohol.   Stress and anxiety.   Strenuous physical activity.   Fatigue.   Certain medicines.   Heart disease, especially if you have a history of arrhythmias. This includes atrial fibrillation, atrial flutter, or supraventricular tachycardia.   An improperly working pacemaker or defibrillator.  DIAGNOSIS   To find the cause of your palpitations, your caregiver will take your history and perform a physical exam. Tests may also be done, including:   Electrocardiography (ECG). This test records the heart's electrical activity.   Cardiac monitoring. This allows your caregiver to monitor your heart rate and rhythm in real time.   Holter monitor. This is a portable device that records your heartbeat and can help diagnose heart arrhythmias. It allows your caregiver to track your heart activity for several days, if needed.   Stress tests by exercise or by giving medicine that makes the heart beat faster.  TREATMENT   Treatment of palpitations depends on the cause of your symptoms and can vary greatly. Most cases of palpitations do not require any treatment other than time, relaxation, and monitoring your symptoms. Other causes, such as atrial fibrillation, atrial flutter, or supraventricular tachycardia, usually require further treatment.  HOME CARE INSTRUCTIONS    Avoid:   Caffeinated coffee, tea, soft drinks, diet pills, and energy drinks.   Chocolate.   Alcohol.   Stop smoking if you smoke.   Reduce your stress and anxiety. Things that can help you relax include:   A method that measures bodily functions so  you can learn to control them (biofeedback).   Yoga.   Meditation.   Physical activity such as swimming, jogging, or walking.   Get plenty of rest and sleep.  SEEK MEDICAL CARE IF:    You continue to have a fast or irregular heartbeat beyond 24 hours.   Your palpitations occur more often.  SEEK IMMEDIATE MEDICAL CARE IF:   You develop chest pain or shortness of breath.   You have a severe headache.   You feel dizzy, or you faint.  MAKE SURE YOU:   Understand these instructions.   Will watch your condition.   Will get help right away if you are not doing well or get worse.  Document Released: 10/12/2000 Document Revised: 02/09/2013 Document Reviewed: 12/14/2011  [employer] Patient Information 2014 [employer].

## 2013-12-16 NOTE — ED Notes (Signed)
Patient transported to X-ray 

## 2013-12-16 NOTE — ED Provider Notes (Signed)
Chief Complaint   Chief Complaint  Patient presents with  . Fatigue    History of Present Illness   Alexandria Beck is a pleasant 80 year old female who presents today with a one-week history of numbness and tingling of the left hand. This occurs mostly at nighttime. She denies any weakness. There is no neck pain or headache. She denies any numbness or tingling or weakness of the face or the leg. She denies any swelling or pain in the hand or the wrist. She also complains of a 3 to four-day history of intermittent racing of her heart. This is accompanied by a fullness in her chest. She denies any chest pain, pressure, or tightness. Episodes can come and go and can last for minutes or hours at a time. She's never had any kind of heart problem before and denies any history of a heart attack or irregular heartbeat. Also for the past one to 2 weeks she's had episodes of shortness of breath. This is mainly with exertion. She denies ankle edema, PND, orthopnea. She denies any history of congestive heart failure.  Review of Systems   Other than noted above, the patient denies any of the following symptoms. Pulmonary:  No cough, wheezing, shortness of breath. Cardiac:  No chest pain, tightness, pressure, dizziness, presyncope, syncope, PND, orthopnea, or edema. Psych:  No anxiety or depression. Endo:  No weight loss, tremor, sweats, or heat intolerance.  [place]   Past medical history, family history, social history, meds, and allergies were reviewed.  She is intolerant to large doses of aspirin, but takes 81 mg of aspirin per day. She takes Nexium for reflux symptoms and furosemide for high blood pressure. She also takes gabapentin for her peripheral neuropathy.  Physical Examination   Vital signs:  BP 145/102  Pulse 87  Temp(Src) 98.2 F (36.8 C) (Oral)  Resp 18  SpO2 99% Gen:  Alert, oriented, in no distress, skin warm and dry. Eye:  PERRL, lids and conjunctivas normal.  No stare or lid  lag. ENT:  Mucous membranes moist, pharynx clear. Neck:  Supple, no adenopathy or tenderness.  No JVD.  Thyroid not enlarged. Lungs:  Clear to auscultation, no wheezes, rales or rhonchi.  No respiratory distress. Heart:  Regular rhythm, no extrasystoles.  No gallops, murmers, clicks or rubs. Abdomen:  Soft, nontender, no organomegaly or mass.  Bowel sounds normal.  No pulsatile abdominal mass or bruit. Ext:  No edema. Pulses full and equal. Skin:  Warm and dry.  No rash.  EKG Results    Date: 12/16/2013  Rate: 72  Rhythm: normal sinus rhythm  QRS Axis: normal  Intervals: normal  ST/T Wave abnormalities: normal  Conduction Disutrbances:none  Narrative Interpretation: Normal sinus rhythm, possible left atrial enlargement, anteroseptal infarct with QS waves in leads V1, V2, and V3 and poor R-wave progression, no change since previous EKG of 2012. No acute ischemic changes.  Old EKG Reviewed: unchanged  Course in Urgent [place]   She was begun on IV normal saline at 50 mL per hour, nasal prong oxygen, and was monitored. She'll be sent to the emergency room by [facility].  Assessment   The primary encounter diagnosis was Carpal tunnel syndrome. Diagnoses of Dyspnea and Palpitations were also pertinent to this visit.  The palpitations may be paroxysmal atrial fibrillation, PSVT, or ventricular tachycardia. Differential diagnosis of the shortness of breath includes congestive heart failure, or angina equivalent.  Plan    The patient was transferred to the ED via  [facility] in stable condition.  Medical Decision Making   80 year old female patient of Dr. Linda Hedges presents today with a 1 week history of left hand numbness, a 3 to 4 day history of intermittent racing of heart, and a 1 to 2 week history of dyspnea on exertion.  She denies any chest discomfort, but I wonder if she could have an angina equivalent or atrial fibrillation.  Her EKG shows a prior ASMI, but no acute changes and no  change since 2012.  We will send via [facility].         Harden Mo, MD 12/16/13 1007

## 2013-12-22 ENCOUNTER — Ambulatory Visit (INDEPENDENT_AMBULATORY_CARE_PROVIDER_SITE_OTHER): Payer: Medicare Other | Admitting: Internal Medicine

## 2013-12-22 ENCOUNTER — Encounter: Payer: Self-pay | Admitting: Internal Medicine

## 2013-12-22 VITALS — BP 144/90 | HR 102 | Temp 97.1°F | Wt 169.8 lb

## 2013-12-22 DIAGNOSIS — I10 Essential (primary) hypertension: Secondary | ICD-10-CM

## 2013-12-22 DIAGNOSIS — F419 Anxiety disorder, unspecified: Secondary | ICD-10-CM

## 2013-12-22 DIAGNOSIS — F411 Generalized anxiety disorder: Secondary | ICD-10-CM

## 2013-12-22 DIAGNOSIS — R209 Unspecified disturbances of skin sensation: Secondary | ICD-10-CM

## 2013-12-22 DIAGNOSIS — R202 Paresthesia of skin: Secondary | ICD-10-CM

## 2013-12-22 DIAGNOSIS — R1310 Dysphagia, unspecified: Secondary | ICD-10-CM

## 2013-12-22 MED ORDER — ESOMEPRAZOLE MAGNESIUM 40 MG PO CPDR: 40.0000 mg | DELAYED_RELEASE_CAPSULE | Freq: Every day | ORAL | Status: DC

## 2013-12-22 MED ORDER — SERTRALINE HCL 25 MG PO TABS: 25.0000 mg | ORAL_TABLET | Freq: Every day | ORAL | Status: DC

## 2013-12-22 MED ORDER — ZOLPIDEM TARTRATE 5 MG PO TABS: 5.0000 mg | ORAL_TABLET | Freq: Every evening | ORAL | Status: DC | PRN

## 2013-12-22 MED ORDER — FUROSEMIDE 20 MG PO TABS: 20.0000 mg | ORAL_TABLET | Freq: Every day | ORAL | Status: DC

## 2013-12-22 NOTE — Assessment & Plan Note (Signed)
BP Readings from Last 3 Encounters:  12/22/13 144/90  12/16/13 152/76  12/16/13 145/102    Patient's blood pressure is well controlled on current medication.

## 2013-12-22 NOTE — Progress Notes (Signed)
Subjective:     Patient ID: Alexandria Beck, female   DOB: [birth year], 80 y.o.   MRN: [id number]  HPI  The patient initially presented to urgent care on 12/16/2013 with a one week history of numbness and tingling in the left hand, a 3-4 day history of racing heartbeat. She had a several week history of shortness of breath with exertion, but denied chest pain. The combination of shortness of breath and left arm tingling raised the index of suspicion enough that the patient was transferred to the emergency room for cardiac evaluation. She felt like she had a fullness in the top of her stomach.   Notes and labs from Urgent Care and ED 12/16/2013: EKG and exam at urgent care was normal. Studies, notes reviewed. Chest x-ray and EKG in ED was normal. Studies, notes reviewed.    Sodium, potassium, blood sugar, troponins, BNP, CBC, all within normal limits.  Since her presentation to the ED, she reports no problems. She has not been taking gabapentin because it makes her feel drowsy and her tongue heavy.   Past Medical History  Diagnosis Date  . Near syncope     diagnosed as anxiety related. gets some relief with clonazepam. No formal testing done.  [name] GERD (gastroesophageal reflux disease)     has had EGD 2006-negative  . Allergy     seasonal  . Irregular heart beat     no testing, holter, etc. Takes no medication  . Hypertension      On no medication. Home readings SBP=150's  . Diverticulosis     2004  . Internal hemorrhoids   . Esophageal stricture    Past Surgical History  Procedure Laterality Date  . Abdominal hysterectomy  1974    fibroid tumors w/ metorrhagia  . Tonsillectomy    . G4p4 - nsvd  midwife delivery for two    . Tonsillectomy     Family History  Problem Relation Age of Onset  . Heart disease Mother   . Stroke Mother   . Hypertension Mother   . Cancer Father     lung cancer - smoker  . Heart disease Sister     MI  . Kidney disease Sister     end stage renal disease  on HD  . Diabetes Sister   . Heart disease Brother   . Diabetes Sister   . Hypertension Sister   . Kidney disease Sister   . Cancer Sister   . Diabetes Sister   . Kidney disease Sister     HD  . Hypertension Sister   . Heart disease Sister   . Hypertension Sister   . Heart disease Sister   . Hypertension Sister   . Hypertension Sister    History   Social History  . Marital Status: Widowed    Spouse Name: N/A    Number of Children: 4  . Years of Education: 11   Occupational History  . stock person     retired   Social History Main Topics  . Smoking status: Never Smoker   . Smokeless tobacco: Never Used  . Alcohol Use: No  . Drug Use: No  . Sexual Activity: Not Currently   Other Topics Concern  . Not on file   Social History Narrative   11th grade. Married '52- '92/widowed. 2 sons- '55, '63; 2 dtrs - '54, '60. 12 grandchildren. 7 greatgrands. Lives in own home with a son who is in and out. Work - [employer],  stockperson.      Do you drink Alcoholic Beverages-No. Do you drink caffienated Beverages- No. Do you use seatbelt often- Yes. Do you exercise at least 3 times a week- . Do you take vitamins- . Do you use a hearing aid- No. Do you wear dentures- Yes. Is there a smoke alarm in your house- Yes. Are there guns and firearms in your household- . Have you experienced physical abuse- No.Usual # of hours of sleep per night 3. Number of people living at your residence- 2                           Review of Systems  Constitutional: No weight loss, night sweats. Pulmonary: No cough, wheezing, shortness of breath.  Cardiac: No chest pain, tightness, no irregular hearth rhythm or palpitations MSK: Intermittent tingling in left arm.   Endo: No  tremor, or heat intolerance.      Objective:   Physical Exam Filed Vitals:   12/22/13 0807  BP: 144/90  Pulse: 102  Temp: 97.1 F (36.2 C)   Filed Weights   12/22/13 0807  Weight: 169 lb 12.8 oz (77.021 kg)     General: Well developed, well nourished, NAD, appears younger than stated age HEENT: NCAT, PERRLA, EOMI, Anicteic Sclera, mucous membranes moist.  Neck: Supple, no JVD, no masses  Cardiovascular: S1 S2 auscultated, no rubs, murmurs or gallops. Regular rate and rhythm.  Respiratory: Clear to auscultation bilaterally with equal chest rise  Extremities: warm, dry without cyanosis, clubbing, or edema  Neuro: Alert and Oriented x3, cranial nerves II-XII grossly intact.  MSK: Tinel's sign and Phalen's sign unremarkable Skin: Without rashes, exudates, or nodules  Psych: Normal mood and affect with intact judgement and insight    Current Outpatient Prescriptions on File Prior to Visit  Medication Sig Dispense Refill  . ALPRAZolam (XANAX) 0.5 MG tablet Take 0.5 mg by mouth 2 (two) times daily as needed for anxiety or sleep.      . diphenhydrAMINE (BENADRYL) 50 MG capsule Take 50 mg by mouth every 6 (six) hours as needed for allergies.      [name] esomeprazole (NEXIUM) 40 MG capsule Take 1 capsule (40 mg total) by mouth daily at 12 noon.  30 capsule  6  . furosemide (LASIX) 20 MG tablet Take 1 tablet (20 mg total) by mouth daily.  30 tablet  3  . gabapentin (NEURONTIN) 100 MG capsule Take 2 capsules (200 mg total) by mouth 3 (three) times daily.  180 capsule  3  . Multiple Vitamin (MULTIVITAMIN WITH MINERALS) TABS Take 1 tablet by mouth daily.      . Olopatadine HCl 0.2 % SOLN Apply 1 drop to eye 2 (two) times daily as needed (for dry eyes).      . sertraline (ZOLOFT) 25 MG tablet Take 25 mg by mouth daily.      . vitamin C (ASCORBIC ACID) 500 MG tablet Take 500 mg by mouth daily.      [name] zolpidem (AMBIEN) 5 MG tablet Take 1 tablet (5 mg total) by mouth at bedtime as needed for sleep.  30 tablet  0   No current facility-administered medications on file prior to visit.    Assessment and Plan     Cardiac evaluation - as above - negative evaluation for angina or CHF  Plan No further evaluation at  this time   For recurrent or progressive symptoms will pursue further risk stratification.

## 2013-12-22 NOTE — Patient Instructions (Addendum)
It has been a pleasure providing medical care for you today.  1) Emergency Room Follow Up - You do not need any other tests today. - It seems like you had a very thorough workup on the 18th. - If you notice an irregular heartbeat, chest pain, lots of sweating, or intense short of breath, return to the office.  2) Gabapentin - You can stop this medication - It sounds like the side effects are worst than the benefits

## 2013-12-22 NOTE — Assessment & Plan Note (Addendum)
Patient presented to urgent care and transferred to ED with left arm and hand tingling on 12/16/13. Labs, imaging, and exam was normal. Etiology likely carpal tunnel.

## 2013-12-22 NOTE — Progress Notes (Signed)
Pre visit review using our clinic review tool, if applicable. No additional management support is needed unless otherwise documented below in the visit note. 

## 2013-12-22 NOTE — Assessment & Plan Note (Signed)
Well controlled at this time.   Plan Continue current medications.

## 2013-12-24 ENCOUNTER — Ambulatory Visit: Payer: Medicare Other

## 2013-12-28 ENCOUNTER — Telehealth: Payer: Self-pay | Admitting: *Deleted

## 2013-12-28 NOTE — Telephone Encounter (Signed)
[employer] phoned stating they had received a fax that was sent to them, but additional information required.  CB# [phone]

## 2013-12-30 NOTE — Telephone Encounter (Signed)
Received a fax from [facility] that Zolpidem Tartrate is denied coverage

## 2013-12-31 ENCOUNTER — Encounter: Payer: Self-pay | Admitting: Gastroenterology

## 2013-12-31 ENCOUNTER — Ambulatory Visit (INDEPENDENT_AMBULATORY_CARE_PROVIDER_SITE_OTHER): Payer: Medicare Other | Admitting: Gastroenterology

## 2013-12-31 VITALS — BP 140/90 | HR 76 | Ht 61.5 in | Wt 169.4 lb

## 2013-12-31 DIAGNOSIS — R1310 Dysphagia, unspecified: Secondary | ICD-10-CM

## 2013-12-31 DIAGNOSIS — K219 Gastro-esophageal reflux disease without esophagitis: Secondary | ICD-10-CM

## 2013-12-31 NOTE — Assessment & Plan Note (Signed)
Recent barium esophagram did not demonstrate strictures.  She has a nonspecific motility disorder.  Plan no further therapy.

## 2013-12-31 NOTE — Progress Notes (Signed)
          History of Present Illness:  The patient has returned for followup of dysphagia.  Barium esophagram demonstrated tertiary contractions of the esophagus.  No strictures were seen.  On Nexium symptoms have resolved.  She no longer complains of dysphagia.  She has no other GI complaints.    Review of Systems: Pertinent positive and negative review of systems were noted in the above HPI section. All other review of systems were otherwise negative.    Current Medications, Allergies, Past Medical History, Past Surgical History, Family History and Social History were reviewed in [facility] record  Vital signs were reviewed in today's medical record. Physical Exam: General: Well developed , well nourished, no acute distress   See Assessment and Plan under Problem List

## 2013-12-31 NOTE — Patient Instructions (Signed)
Follow up as needed

## 2013-12-31 NOTE — Assessment & Plan Note (Signed)
Well-controlled with Nexium.  Will continue as needed

## 2014-01-01 ENCOUNTER — Ambulatory Visit
Admission: RE | Admit: 2014-01-01 | Discharge: 2014-01-01 | Disposition: A | Discharge status: Home / Self Care | Payer: Medicare Other | Source: Ambulatory Visit

## 2014-01-01 DIAGNOSIS — Z1231 Encounter for screening mammogram for malignant neoplasm of breast: Secondary | ICD-10-CM

## 2014-01-11 ENCOUNTER — Other Ambulatory Visit: Payer: Self-pay | Admitting: Internal Medicine

## 2014-01-11 MED ORDER — ALPRAZOLAM 0.5 MG PO TABS: 0.5000 mg | ORAL_TABLET | Freq: Two times a day (BID) | ORAL | Status: DC | PRN

## 2014-01-11 NOTE — Telephone Encounter (Signed)
Pt called request refill for xanax to be send into [facility] due to sleepless.

## 2014-04-07 ENCOUNTER — Encounter: Payer: Self-pay | Admitting: Internal Medicine

## 2014-04-07 ENCOUNTER — Ambulatory Visit (INDEPENDENT_AMBULATORY_CARE_PROVIDER_SITE_OTHER): Payer: Medicare Other | Admitting: Internal Medicine

## 2014-04-07 ENCOUNTER — Other Ambulatory Visit (INDEPENDENT_AMBULATORY_CARE_PROVIDER_SITE_OTHER): Payer: Medicare Other

## 2014-04-07 VITALS — BP 158/110 | HR 98 | Temp 98.3°F | Wt 166.4 lb

## 2014-04-07 DIAGNOSIS — I10 Essential (primary) hypertension: Secondary | ICD-10-CM

## 2014-04-07 DIAGNOSIS — R9431 Abnormal electrocardiogram [ECG] [EKG]: Secondary | ICD-10-CM

## 2014-04-07 DIAGNOSIS — R12 Heartburn: Secondary | ICD-10-CM

## 2014-04-07 LAB — H. PYLORI ANTIBODY, IGG: H Pylori IgG: POSITIVE — AB

## 2014-04-07 LAB — CBC WITH DIFFERENTIAL/PLATELET
BASOS PCT: 0.3 % (ref 0.0–3.0)
Basophils Absolute: 0 10*3/uL (ref 0.0–0.1)
Eosinophils Absolute: 0.2 10*3/uL (ref 0.0–0.7)
Eosinophils Relative: 2.5 % (ref 0.0–5.0)
HCT: 41 % (ref 36.0–46.0)
Hemoglobin: 13.8 g/dL (ref 12.0–15.0)
LYMPHS ABS: 3.6 10*3/uL (ref 0.7–4.0)
Lymphocytes Relative: 57 % — ABNORMAL HIGH (ref 12.0–46.0)
MCHC: 33.7 g/dL (ref 30.0–36.0)
MCV: 92.2 fl (ref 78.0–100.0)
MONO ABS: 0.4 10*3/uL (ref 0.1–1.0)
Monocytes Relative: 6.4 % (ref 3.0–12.0)
NEUTROS PCT: 33.8 % — AB (ref 43.0–77.0)
Neutro Abs: 2.1 10*3/uL (ref 1.4–7.7)
Platelets: 361 10*3/uL (ref 150.0–400.0)
RBC: 4.45 Mil/uL (ref 3.87–5.11)
RDW: 13 % (ref 11.5–15.5)
WBC: 6.2 10*3/uL (ref 4.0–10.5)

## 2014-04-07 LAB — BASIC METABOLIC PANEL
BUN: 12 mg/dL (ref 6–23)
CO2: 28 mEq/L (ref 19–32)
CREATININE: 0.9 mg/dL (ref 0.4–1.2)
Calcium: 9.9 mg/dL (ref 8.4–10.5)
Chloride: 100 mEq/L (ref 96–112)
GFR: 78.33 mL/min (ref >60.00)
Glucose, Bld: 103 mg/dL — ABNORMAL HIGH (ref 70–99)
Potassium: 4.3 mEq/L (ref 3.5–5.1)
Sodium: 138 mEq/L (ref 135–145)

## 2014-04-07 MED ORDER — SUCRALFATE 1 G PO TABS: ORAL_TABLET | ORAL | Status: DC

## 2014-04-07 MED ORDER — METOPROLOL TARTRATE 25 MG PO TABS: 25.0000 mg | ORAL_TABLET | Freq: Two times a day (BID) | ORAL | Status: DC

## 2014-04-07 NOTE — Progress Notes (Signed)
   Subjective:    Patient ID: Alexandria Beck, female    DOB: 04-02-1931, 80 y.o.   MRN: [id number]  HPI  She describes heartburn for 2-3 weeks associated with burping and some "fluttering". It was worse 04/06/14. The symptoms were "milder" today.  She does take Nexium 40 mg daily.  She avoids nonsteroidals and aspirin products. She does not drink caffeine or alcohol   She will ingest some peppermint  She has lost 4 pounds last 30 days but she attributes this to a vigorous exercise program. She walks 4 blocks to and from the exercise center. There she will use machines. She does this 3-4 times per week. She does not have any chest pain with those activities  She is on no antihypertensive; it was her understanding that furosemide was prescribed for hypertension.  Negative upper endoscopy 2006.   Review of Systems  She specifically denies dysphagia, anorexia, hematemesis, fever, chills, or sweats.  She has no dysuria, pyuria, or hematuria.  She does not have pain that starts in the back and radiates anteriorly.  She also denies nausea, vomiting, constipation, diarrhea, melena, or rectal bleeding.       Objective:   Physical Exam  Significant or distinguishing  findings on physical exam are documented first.  Below that are other systems examined & findings.   She appears healthy and well-nourished in no distress.  Wax present  in both otic canals.  She has full dentures.   The lordotic curve is accentuated superiorly in the thoracic spine.  Pedal pulses are equal but decreased except  the right dorsalis pedis pulse was strong. Homans sign is negative.  Eyes: No conjunctival inflammation or scleral icterus is present.  Oral exam: Dental hygiene is good; lips and gums are healthy appearing.There is no oropharyngeal erythema or exudate noted.   Heart:  Normal rate and regular rhythm. S1 and S2 normal without gallop, click, rub or other extra sounds .Grade 1/6 systolic  murmur   Lungs:Chest clear to auscultation; no wheezes, rhonchi,rales ,or rubs present.No increased work of breathing.   Abdomen: bowel sounds normal, soft and non-tender without masses, organomegaly or hernias noted.  No guarding or rebound . No tenderness over the flanks to percussion  Musculoskeletal: Able to lie flat and sit up without help. Negative straight leg raising bilaterally. Gait normal  Skin:Warm & dry.  Intact without suspicious lesions or rashes ; no jaundice or tenting  Lymphatic: No lymphadenopathy, or inguinal a is noted about the head, neck, axilla                Assessment & Plan:  #1" heartburn" despite taking Nexium and avoiding most reflux major triggers  #2 hypertension, uncontrolled   #3 nonspecific ST-T changes. This is new since 11/1813.  Plan: See orders and recommendations.  Cardiology referral Trial of Carafate added to Nexium.

## 2014-04-07 NOTE — Progress Notes (Signed)
Pre visit review using our clinic review tool, if applicable. No additional management support is needed unless otherwise documented below in the visit note. 

## 2014-04-07 NOTE — Patient Instructions (Addendum)
Your next office appointment will be determined based upon review of your pending labs . Those instructions will be transmitted to you through My Chart  OR  by mail.  Minimal Blood Pressure Goal= AVERAGE < 140/90;  Ideal is an AVERAGE < 135/85. This AVERAGE should be calculated from @ least 5-7 BP readings taken @ different times of day on different days of week. You should not respond to isolated BP readings , but rather the AVERAGE for that week .Please bring your  blood pressure cuff to office visits to verify that it is reliable.It  can also be checked against the blood pressure device at the pharmacy. Finger or wrist cuffs are not dependable; an arm cuff is.   Reflux of gastric acid may be asymptomatic as this may occur mainly during sleep.The triggers for reflux  include stress; the "aspirin family" ; alcohol; peppermint; and caffeine (coffee, tea, cola, and chocolate). The aspirin family would include aspirin and the nonsteroidal agents such as ibuprofen &  Naproxen. Tylenol would not cause reflux. If having symptoms ; food & drink should be avoided for @ least 2 hours before going to bed.  Dissolve  Carafate tablet in 5 cc (1 teaspoon) of water. Take before meals

## 2014-04-08 ENCOUNTER — Other Ambulatory Visit: Payer: Self-pay | Admitting: Internal Medicine

## 2014-04-08 DIAGNOSIS — R768 Other specified abnormal immunological findings in serum: Secondary | ICD-10-CM

## 2014-04-08 DIAGNOSIS — R12 Heartburn: Secondary | ICD-10-CM

## 2014-04-08 LAB — TROPONIN I: Troponin I: <0.01 ng/mL (ref <0.06)

## 2014-04-08 MED ORDER — AMOXICILLIN 500 MG PO CAPS: ORAL_CAPSULE | ORAL | Status: DC

## 2014-04-08 MED ORDER — CLARITHROMYCIN 500 MG PO TABS: ORAL_TABLET | ORAL | Status: DC

## 2014-04-09 ENCOUNTER — Ambulatory Visit: Payer: Medicare Other

## 2014-04-09 ENCOUNTER — Telehealth: Payer: Self-pay

## 2014-04-09 DIAGNOSIS — R7309 Other abnormal glucose: Secondary | ICD-10-CM

## 2014-04-09 LAB — HEMOGLOBIN A1C: HEMOGLOBIN A1C: 6.1 % (ref 4.6–6.5)

## 2014-04-09 NOTE — Telephone Encounter (Signed)
Message copied by Shelly Coss on Fri Apr 09, 2014  8:38 AM ------      Message from: Hendricks Limes      Created: Thu Apr 08, 2014  6:14 PM       Please add A1c (790.29)       ------

## 2014-04-09 NOTE — Telephone Encounter (Signed)
a1c added

## 2014-04-09 NOTE — Telephone Encounter (Signed)
Message copied by Shelly Coss on Fri Apr 09, 2014  8:37 AM ------      Message from: Hendricks Limes      Created: Thu Apr 08, 2014  6:07 PM       Please send new  Rx s with lab results. ------

## 2014-04-09 NOTE — Telephone Encounter (Signed)
Prescriptions have been faxed to [facility] on [address]

## 2014-05-20 ENCOUNTER — Ambulatory Visit (INDEPENDENT_AMBULATORY_CARE_PROVIDER_SITE_OTHER): Payer: Commercial Managed Care - HMO | Admitting: Internal Medicine

## 2014-05-20 ENCOUNTER — Encounter: Payer: Self-pay | Admitting: Internal Medicine

## 2014-05-20 VITALS — BP 176/84 | HR 67 | Ht 61.0 in | Wt 165.2 lb

## 2014-05-20 DIAGNOSIS — I10 Essential (primary) hypertension: Secondary | ICD-10-CM

## 2014-05-20 DIAGNOSIS — E785 Hyperlipidemia, unspecified: Secondary | ICD-10-CM

## 2014-05-20 DIAGNOSIS — R011 Cardiac murmur, unspecified: Secondary | ICD-10-CM

## 2014-05-20 DIAGNOSIS — R55 Syncope and collapse: Secondary | ICD-10-CM

## 2014-05-20 DIAGNOSIS — I493 Ventricular premature depolarization: Secondary | ICD-10-CM

## 2014-05-20 DIAGNOSIS — I4949 Other premature depolarization: Secondary | ICD-10-CM

## 2014-05-20 MED ORDER — METOPROLOL TARTRATE 25 MG PO TABS: 37.5000 mg | ORAL_TABLET | Freq: Two times a day (BID) | ORAL | Status: DC

## 2014-05-20 NOTE — Progress Notes (Signed)
OFFICE NOTE  Chief Complaint:  Palpitations  Primary Care Physician: Garnette Czech, MD  HPI:  Alexandria Beck is a pleasant 80 year old female is currently referred to me by Dr. Huey Bienenstock for evaluation of palpitations. She has relatively few medical problems including mild hypertension, some anxiety and poor sleep as well as reflux disease. Her family history is significant for longevity as her mother died of stroke at 71. There is no premature coronary disease. She has a history of palpitations who she said sometimes is worse at night or at times when she wakes up from vivid dreams. Generally, however she is not particularly bothered by them. She recently had an EKG which showed PVCs. She probably had an episode of shortness of breath in February and was sent in the emergency room. She was noted to have PVCs and palpitations then and ruled out for MI. It was felt that her symptoms were possibly do to anxiety. She denies any chest pain or worsening shortness of breath. She is quite active in fact walks to a center and does exercise on some treadmills and other machines. She denies any difficulty doing this.  She has been told that she has a murmur before and it was clearly auscultated today. She has not had prior workup of his heart murmur.  PMHx:  Past Medical History  Diagnosis Date  . Near syncope     diagnosed as anxiety related. gets some relief with clonazepam. No formal testing done.  [name] GERD (gastroesophageal reflux disease)     has had EGD 2006-negative  . Allergy     seasonal  . Irregular heart beat     no testing, holter, etc. Takes no medication  . Hypertension      On no medication. Home readings SBP=150's  . Diverticulosis     2004  . Internal hemorrhoids   . Esophageal stricture     Past Surgical History  Procedure Laterality Date  . Abdominal hysterectomy  1974    fibroid tumors w/ metorrhagia  . Tonsillectomy    . G4p4 - nsvd  midwife delivery for two    .  Tonsillectomy      FAMHx:  Family History  Problem Relation Age of Onset  . Heart disease Mother   . Stroke Mother   . Hypertension Mother   . Cancer Father     lung cancer - smoker  . Heart disease Sister     MI  . Kidney disease Sister     end stage renal disease on HD  . Diabetes Sister   . Heart disease Brother   . Diabetes Sister   . Hypertension Sister   . Kidney disease Sister   . Cancer Sister   . Diabetes Sister   . Kidney disease Sister     HD  . Hypertension Sister   . Heart disease Sister   . Hypertension Sister   . Heart disease Sister   . Hypertension Sister   . Hypertension Sister     SOCHx:   reports that she has never smoked. She has never used smokeless tobacco. She reports that she does not drink alcohol or use illicit drugs.  ALLERGIES:  Allergies  Allergen Reactions  . Clonazepam Swelling    Tongue thickening sensation  . Codeine Rash  . Aspirin Nausea And Vomiting    Stomach upset    ROS: A comprehensive review of systems was negative except for: Cardiovascular: positive for palpitations  HOME MEDS: Current Outpatient  Prescriptions  Medication Sig Dispense Refill  . ALPRAZolam (XANAX) 0.5 MG tablet Take 1 tablet (0.5 mg total) by mouth 2 (two) times daily as needed for anxiety or sleep.  60 tablet  2  . Esomeprazole Magnesium (NEXIUM 24HR PO) Take 22.3 mg by mouth daily.      . metoprolol tartrate (LOPRESSOR) 25 MG tablet Take 1.5 tablets (37.5 mg total) by mouth 2 (two) times daily.  90 tablet  6  . Olopatadine HCl 0.2 % SOLN Apply 1 drop to eye 2 (two) times daily as needed (for dry eyes).      . Omega-3 Fatty Acids (OMEGA ESSENTIALS BASIC PO) Take by mouth daily. Omega XL      . vitamin C (ASCORBIC ACID) 500 MG tablet Take 500 mg by mouth daily.       No current facility-administered medications for this visit.    LABS/IMAGING: No results found for this or any previous visit (from the past 48 hour(s)). No results  found.  VITALS: BP 176/84  Pulse 67  Ht 5\' 1"  (1.549 m)  Wt 165 lb 3.2 oz (74.934 kg)  BMI 31.23 kg/m2  EXAM: General appearance: alert and no distress Neck: no carotid bruit and no JVD Lungs: clear to auscultation bilaterally Heart: regular rate and rhythm, S1, S2 normal and systolic murmur: early systolic 3/6, crescendo at 2nd right intercostal space Abdomen: soft, non-tender; bowel sounds normal; no masses,  no organomegaly Extremities: extremities normal, atraumatic, no cyanosis or edema Pulses: 2+ and symmetric Skin: Skin color, texture, turgor normal. No rashes or lesions Neurologic: Grossly normal Psych: Mood, affect normal  EKG: Normal sinus rhythm at 67, poor R-wave progression  ASSESSMENT: 1. Palpitations 2. PVCs 3. Hypertension  PLAN: 1.   [name] is minimally symptomatic with her palpitations. She is on some beta blocker which is probably helping her. Her blood pressure is elevated today although she says is typically higher the doctor's office. I suspect she could probably tolerate an increase in her beta blocker and I recommend increasing that today to 37.5 mg twice daily. She does have a systolic murmur which sounds like aortic sclerosis however I would like to obtain echocardiogram to further evaluate this. Would also be helpful to know she has normal systolic function and wall motion. She is asymptomatic with exercise and I do not feel that stress testing or other ischemia evaluation is warranted unless her echo is abnormal.  Thanks for the kind referral.  Pixie Casino, MD, [facility] Attending Cardiologist [facility]  HILTY,Kenneth C 05/20/2014, 11:10 AM

## 2014-05-20 NOTE — Patient Instructions (Signed)
Your physician has requested that you have an echocardiogram. Echocardiography is a painless test that uses sound waves to create images of your heart. It provides your doctor with information about the size and shape of your heart and how well your heart's chambers and valves are working. This procedure takes approximately one hour. There are no restrictions for this procedure.  Your physician has recommended you make the following change in your medication  INCREASE metoprolol tartrate to 1.5 tablet (37.5mg ) twice daily.   Your physician recommends that you schedule a follow-up appointment after your echo.

## 2014-06-01 ENCOUNTER — Ambulatory Visit (HOSPITAL_COMMUNITY)
Admission: RE | Admit: 2014-06-01 | Discharge: 2014-06-01 | Disposition: A | Discharge status: Home / Self Care | Payer: Medicare HMO | Source: Ambulatory Visit | Attending: Internal Medicine | Admitting: Internal Medicine

## 2014-06-01 DIAGNOSIS — I059 Rheumatic mitral valve disease, unspecified: Secondary | ICD-10-CM

## 2014-06-01 DIAGNOSIS — I493 Ventricular premature depolarization: Secondary | ICD-10-CM

## 2014-06-01 DIAGNOSIS — R011 Cardiac murmur, unspecified: Secondary | ICD-10-CM

## 2014-06-01 DIAGNOSIS — R002 Palpitations: Secondary | ICD-10-CM

## 2014-06-01 DIAGNOSIS — I700 Atherosclerosis of aorta: Secondary | ICD-10-CM | POA: Diagnosis not present

## 2014-06-01 NOTE — Progress Notes (Signed)
2D Echo Performed 06/01/2014    Marygrace Drought, RCS

## 2014-06-15 ENCOUNTER — Telehealth: Payer: Self-pay | Admitting: *Deleted

## 2014-06-15 NOTE — Telephone Encounter (Signed)
Xanax is among those which experts have documented to have a very  high risk of affecting  mental  alertness  & balance. This results in increased risk of falling with serious health or life threatening injury. Such medication should be taken as infrequently as possible and @  the lowest possible dose.It should not be taken with alcohol, sedatives  or other agents which have a similar  adverse risk potential. These risks are greater as we age as there is decreased ability of the liver and kidneys to metabolize and excrete the medication, resulting in   increased blood levels of the active ingredient. #30 as above

## 2014-06-15 NOTE — Telephone Encounter (Signed)
Pt called requesting a refill on her alprazolam. Is this ok...Alexandria Beck

## 2014-06-16 MED ORDER — ALPRAZOLAM 0.5 MG PO TABS: 0.5000 mg | ORAL_TABLET | Freq: Two times a day (BID) | ORAL | Status: DC | PRN

## 2014-06-16 NOTE — Telephone Encounter (Signed)
Called refill into [facility] had to leave on pharmacist vm. Notified pt rx sent to [facility].Marland KitchenJohny Beck

## 2014-07-02 ENCOUNTER — Ambulatory Visit: Payer: Medicare Other | Admitting: Internal Medicine

## 2014-07-08 ENCOUNTER — Emergency Department (HOSPITAL_COMMUNITY): Payer: Medicare HMO

## 2014-07-08 ENCOUNTER — Encounter (HOSPITAL_COMMUNITY): Payer: Self-pay | Admitting: Emergency Medicine

## 2014-07-08 ENCOUNTER — Emergency Department (HOSPITAL_COMMUNITY)
Admission: EM | Admit: 2014-07-08 | Discharge: 2014-07-08 | Disposition: A | Discharge status: Home / Self Care | Payer: Medicare HMO | Attending: Emergency Medicine | Admitting: Emergency Medicine

## 2014-07-08 DIAGNOSIS — I10 Essential (primary) hypertension: Secondary | ICD-10-CM | POA: Diagnosis not present

## 2014-07-08 DIAGNOSIS — G4700 Insomnia, unspecified: Secondary | ICD-10-CM | POA: Diagnosis present

## 2014-07-08 DIAGNOSIS — R0789 Other chest pain: Secondary | ICD-10-CM | POA: Diagnosis present

## 2014-07-08 DIAGNOSIS — F411 Generalized anxiety disorder: Secondary | ICD-10-CM | POA: Diagnosis not present

## 2014-07-08 DIAGNOSIS — K219 Gastro-esophageal reflux disease without esophagitis: Secondary | ICD-10-CM | POA: Insufficient documentation

## 2014-07-08 DIAGNOSIS — F419 Anxiety disorder, unspecified: Secondary | ICD-10-CM

## 2014-07-08 DIAGNOSIS — R0602 Shortness of breath: Secondary | ICD-10-CM | POA: Insufficient documentation

## 2014-07-08 DIAGNOSIS — Z79899 Other long term (current) drug therapy: Secondary | ICD-10-CM | POA: Diagnosis not present

## 2014-07-08 DIAGNOSIS — R079 Chest pain, unspecified: Secondary | ICD-10-CM | POA: Insufficient documentation

## 2014-07-08 LAB — I-STAT TROPONIN, ED: TROPONIN I, POC: 0 ng/mL (ref 0.00–0.08)

## 2014-07-08 LAB — I-STAT CHEM 8, ED
BUN: 15 mg/dL (ref 6–23)
CALCIUM ION: 1.06 mmol/L — AB (ref 1.13–1.30)
Chloride: 110 mEq/L (ref 96–112)
Creatinine, Ser: 0.9 mg/dL (ref 0.50–1.10)
Glucose, Bld: 106 mg/dL — ABNORMAL HIGH (ref 70–99)
HCT: 39 % (ref 36.0–46.0)
HEMOGLOBIN: 13.3 g/dL (ref 12.0–15.0)
Potassium: 3.9 mEq/L (ref 3.7–5.3)
Sodium: 141 mEq/L (ref 137–147)
TCO2: 27 mmol/L (ref 0–100)

## 2014-07-08 LAB — CBC WITH DIFFERENTIAL/PLATELET
Basophils Absolute: 0 10*3/uL (ref 0.0–0.1)
Basophils Relative: 1 % (ref 0–1)
EOS ABS: 0.2 10*3/uL (ref 0.0–0.7)
EOS PCT: 4 % (ref 0–5)
HCT: 36.3 % (ref 36.0–46.0)
Hemoglobin: 12.5 g/dL (ref 12.0–15.0)
LYMPHS PCT: 55 % — AB (ref 12–46)
Lymphs Abs: 2.8 10*3/uL (ref 0.7–4.0)
MCH: 30.8 pg (ref 26.0–34.0)
MCHC: 34.4 g/dL (ref 30.0–36.0)
MCV: 89.4 fL (ref 78.0–100.0)
Monocytes Absolute: 0.3 10*3/uL (ref 0.1–1.0)
Monocytes Relative: 6 % (ref 3–12)
Neutro Abs: 1.7 10*3/uL (ref 1.7–7.7)
Neutrophils Relative %: 34 % — ABNORMAL LOW (ref 43–77)
PLATELETS: 310 10*3/uL (ref 150–400)
RBC: 4.06 MIL/uL (ref 3.87–5.11)
RDW: 12.6 % (ref 11.5–15.5)
WBC: 5 10*3/uL (ref 4.0–10.5)

## 2014-07-08 MED ORDER — ASPIRIN EC 325 MG PO TBEC
325.0000 mg | DELAYED_RELEASE_TABLET | Freq: Once | ORAL | Status: AC
  Administered 2014-07-08: 325 mg via ORAL
  Filled 2014-07-08: qty 1

## 2014-07-08 NOTE — Discharge Instructions (Signed)
For your insomnia, try taking 3 mg over the counter melatonin for you insomnia as needed 30-45 minutes prior to going to bed. Try this for a week and follow up with your doctor if this is not helping.   Chest Pain (Nonspecific) It is often hard to give a diagnosis for the cause of chest pain. There is always a chance that your pain could be related to something serious, such as a heart attack or a blood clot in the lungs. You need to follow up with your doctor. HOME CARE  If antibiotic medicine was given, take it as directed by your doctor. Finish the medicine even if you start to feel better.  For the next few days, avoid activities that bring on chest pain. Continue physical activities as told by your doctor.  Do not use any tobacco products. This includes cigarettes, chewing tobacco, and e-cigarettes.  Avoid drinking alcohol.  Only take medicine as told by your doctor.  Follow your doctor's suggestions for more testing if your chest pain does not go away.  Keep all doctor visits you made. GET HELP IF:  Your chest pain does not go away, even after treatment.  You have a rash with blisters on your chest.  You have a fever. GET HELP RIGHT AWAY IF:   You have more pain or pain that spreads to your arm, neck, jaw, back, or belly (abdomen).  You have shortness of breath.  You cough more than usual or cough up blood.  You have very bad back or belly pain.  You feel sick to your stomach (nauseous) or throw up (vomit).  You have very bad weakness.  You pass out (faint).  You have chills. This is an emergency. Do not wait to see if the problems will go away. Call your local emergency services (911 in [place] Do not drive yourself to the hospital. MAKE SURE YOU:   Understand these instructions.  Will watch your condition.  Will get help right away if you are not doing well or get worse. Document Released: 04/02/2008 Document Revised: 10/20/2013 Document Reviewed:  04/02/2008 [facility] Patient Information 2015 [place], [place]. This information is not intended to replace advice given to you by your health care provider. Make sure you discuss any questions you have with your health care provider.

## 2014-07-08 NOTE — ED Notes (Signed)
MD at bedside. 

## 2014-07-08 NOTE — ED Notes (Signed)
PT ambulated with baseline gait; VSS; A&Ox3; no signs of distress; respirations even and unlabored; skin warm and dry; no questions upon discharge.  

## 2014-07-08 NOTE — ED Provider Notes (Signed)
CSN: [id number]     Arrival date & time 07/08/14  0756 History   First MD Initiated Contact with Patient 07/08/14 [id number]     Chief Complaint  Patient presents with  . Chest Pain     (Consider location/radiation/quality/duration/timing/severity/associated sxs/prior Treatment) Patient is a 80 y.o. female presenting with chest pain. The history is provided by the patient.  Chest Pain Chest pain location: upper chest. Pain quality comment:  "funny feeling", and "fullness" Pain radiates to:  Does not radiate Pain radiates to the back: no   Pain severity:  Mild Onset quality:  Unable to specify Duration:  1 hour Timing:  Intermittent Progression:  Resolved Chronicity:  Recurrent Context comment:  Patient was sleeping and dreaming about pleasant interactions with her deceased family members Relieved by:  Nothing Worsened by:  Nothing tried Ineffective treatments:  None tried Associated symptoms: shortness of breath   Associated symptoms: no abdominal pain, no altered mental status, no anorexia, no anxiety, no back pain, no claudication, no cough, no diaphoresis, no dizziness, no dysphagia, no fatigue, no fever, no headache, no heartburn, no lower extremity edema, no nausea, no near-syncope, no numbness, no orthopnea, no palpitations, no PND, no syncope, not vomiting and no weakness   Shortness of breath:    Severity:  Mild   Onset quality:  Unable to specify   Duration: 30 minutes.   Timing:  Rare   Progression:  Resolved Risk factors: hypertension   Risk factors: no aortic disease, no coronary artery disease, no diabetes mellitus, no high cholesterol, no immobilization, not obese, no prior DVT/PE, no smoking and no surgery     Past Medical History  Diagnosis Date  . Near syncope     diagnosed as anxiety related. gets some relief with clonazepam. No formal testing done.  [name] GERD (gastroesophageal reflux disease)     has had EGD 2006-negative  . Allergy     seasonal  . Irregular  heart beat     no testing, holter, etc. Takes no medication  . Hypertension      On no medication. Home readings SBP=150's  . Diverticulosis     2004  . Internal hemorrhoids   . Esophageal stricture    Past Surgical History  Procedure Laterality Date  . Abdominal hysterectomy  1974    fibroid tumors w/ metorrhagia  . Tonsillectomy    . G4p4 - nsvd  midwife delivery for two    . Tonsillectomy     Family History  Problem Relation Age of Onset  . Heart disease Mother   . Stroke Mother   . Hypertension Mother   . Cancer Father     lung cancer - smoker  . Heart disease Sister     MI  . Kidney disease Sister     end stage renal disease on HD  . Diabetes Sister   . Heart disease Brother   . Diabetes Sister   . Hypertension Sister   . Kidney disease Sister   . Cancer Sister   . Diabetes Sister   . Kidney disease Sister     HD  . Hypertension Sister   . Heart disease Sister   . Hypertension Sister   . Heart disease Sister   . Hypertension Sister   . Hypertension Sister    History  Substance Use Topics  . Smoking status: Never Smoker   . Smokeless tobacco: Never Used  . Alcohol Use: No   OB History   Grav Para Term Preterm  Abortions TAB SAB Ect Mult Living                 Review of Systems  Constitutional: Negative for fever, diaphoresis and fatigue.  HENT: Negative for trouble swallowing.   Respiratory: Positive for shortness of breath. Negative for cough.   Cardiovascular: Positive for chest pain. Negative for palpitations, orthopnea, claudication, syncope, PND and near-syncope.  Gastrointestinal: Negative for heartburn, nausea, vomiting, abdominal pain and anorexia.  Musculoskeletal: Negative for back pain.  Neurological: Negative for dizziness, weakness, numbness and headaches.  All other systems reviewed and are negative.     Allergies  Clonazepam; Codeine; and Aspirin  Home Medications   Prior to Admission medications   Medication Sig Start Date  End Date Taking? Authorizing Provider  ALPRAZolam Duanne Moron) 0.5 MG tablet Take 1 tablet (0.5 mg total) by mouth 2 (two) times daily as needed for anxiety or sleep. 06/16/14  Yes Hendricks Limes, MD  ARTIFICIAL TEAR OP Place 1 drop into both eyes 2 (two) times daily.   Yes Historical Provider, MD  Esomeprazole Magnesium (NEXIUM 24HR PO) Take 22.3 mg by mouth 2 (two) times daily.    Yes Historical Provider, MD  metoprolol tartrate (LOPRESSOR) 25 MG tablet Take 1.5 tablets (37.5 mg total) by mouth 2 (two) times daily. 05/20/14  Yes Pixie Casino, MD  Omega-3 Fatty Acids (OMEGA ESSENTIALS BASIC PO) Take 3 capsules by mouth 3 (three) times daily. Omega XL   Yes Historical Provider, MD  OVER THE COUNTER MEDICATION Apply 1 application topically daily. "Two Old Goats - Moisturizing lotion"   Yes Historical Provider, MD  vitamin C (ASCORBIC ACID) 500 MG tablet Take 500 mg by mouth daily.   Yes Historical Provider, MD   BP 149/77  Pulse 61  Temp(Src) 98.2 F (36.8 C) (Oral)  Resp 18  SpO2 95% Physical Exam  Constitutional: She is oriented to person, place, and time. She appears well-developed and well-nourished. No distress.  HENT:  Head: Normocephalic and atraumatic.  Right Ear: External ear normal.  Left Ear: External ear normal.  Eyes: Conjunctivae and EOM are normal. Right eye exhibits no discharge. Left eye exhibits no discharge.  Neck: Normal range of motion. Neck supple. No JVD present.  Cardiovascular: Normal rate, regular rhythm and normal heart sounds.  Exam reveals no gallop and no friction rub.   No murmur heard. Pulmonary/Chest: Effort normal and breath sounds normal. No stridor. No respiratory distress. She has no wheezes. She has no rales. She exhibits no tenderness.  Abdominal: Soft. Bowel sounds are normal. She exhibits no distension. There is no tenderness. There is no rebound and no guarding.  Musculoskeletal: Normal range of motion. She exhibits no edema.  Neurological: She is  alert and oriented to person, place, and time.  Skin: Skin is warm. No rash noted. She is not diaphoretic.  Psychiatric: She has a normal mood and affect. Her behavior is normal.    ED Course  Procedures (including critical care time) Labs Review Labs Reviewed  CBC WITH DIFFERENTIAL - Abnormal; Notable for the following:    Neutrophils Relative % 34 (*)    Lymphocytes Relative 55 (*)    All other components within normal limits  I-STAT CHEM 8, ED - Abnormal; Notable for the following:    Glucose, Bld 106 (*)    Calcium, Ion 1.06 (*)    All other components within normal limits  Randolm Idol, ED    Imaging Review Dg Chest 2 View  07/08/2014  CLINICAL DATA:  Chest pain.  EXAM: CHEST  2 VIEW  COMPARISON:  12/16/2013.  FINDINGS: Mediastinum and hilar structures are normal. Lungs are clear. Heart size normal. No pleural effusion or pneumothorax. No acute bony abnormality.  IMPRESSION: No active cardiopulmonary disease.  Chest stable from prior exam.   Electronically Signed   By: Marcello Moores  Register   On: 07/08/2014 09:06     EKG Interpretation   Date/Time:  Thursday July 08 2014 08:02:09 EDT Ventricular Rate:  79 PR Interval:  174 QRS Duration: 74 QT Interval:  390 QTC Calculation: 447 R Axis:   55 Text Interpretation:  Normal sinus rhythm Left atrial enlargement Septal  infarct , age undetermined No significant change since last tracing  Confirmed by Marinette  MD, [place] ([id number]) on 07/08/2014 8:14:30 AM      MDM   Final diagnoses:  Chest discomfort  Insomnia  Anxiety    Pt presents with chest pain. Resolved about 3.5 hours prior to arrival. No chest pain since then. Has been told she has anxiety and is prescribed Xanax by her PCP. AFVSS. NAD. No resp distress. ECG neg for ischemic changes. ECHO 1 month ago sig for only some LVH, but no wall motion abnormalities noted. CXR neg for PNA, PTX, or mediastinal widening. HEART score 3. Trop negative. Given her pain stopped  about four hours ago and her troponin is negative (in the setting of a low HEART score) I have a LOW suspicion this is related to ACS. I also have a LOW suspicion this is related to aortic dissection. I discussed with the patient at length that I am not comfortable prescribing Xanax and she should follow up with her PCP regarding this. She will also need to follow up with her PCP to determine if she needs a stress test. She was instructed to try 3 mg Melatonin (OTC) as a sleep aid, which she states she will try. Strong return precautions given for worsening symptoms or any other alarming or concerning symptoms or issues. The patient was in agreement with the treatment plan and I answered all of their questions. The patient was stable for dc. At dc, the patient ambulated without difficulty, was moving all four extremities, symptoms improved, NAD. and AOx4 Care discussed with my attending, Dr. Wilson Singer. If performed and available, imaging studies and labs reviewed.     Kelby Aline, MD 07/08/14 [id number]

## 2014-07-08 NOTE — ED Notes (Signed)
Pt c/o left sided CP into left arm starting last night; pt denies SOB

## 2014-07-08 NOTE — ED Notes (Signed)
Patient transported to X-ray 

## 2014-07-13 ENCOUNTER — Ambulatory Visit (INDEPENDENT_AMBULATORY_CARE_PROVIDER_SITE_OTHER): Payer: Commercial Managed Care - HMO | Admitting: Internal Medicine

## 2014-07-13 ENCOUNTER — Encounter: Payer: Self-pay | Admitting: Internal Medicine

## 2014-07-13 VITALS — BP 140/94 | HR 68 | Temp 98.6°F | Resp 16 | Ht 61.5 in | Wt 168.0 lb

## 2014-07-13 DIAGNOSIS — I10 Essential (primary) hypertension: Secondary | ICD-10-CM

## 2014-07-13 DIAGNOSIS — F419 Anxiety disorder, unspecified: Secondary | ICD-10-CM

## 2014-07-13 DIAGNOSIS — F519 Sleep disorder not due to a substance or known physiological condition, unspecified: Secondary | ICD-10-CM

## 2014-07-13 DIAGNOSIS — F411 Generalized anxiety disorder: Secondary | ICD-10-CM

## 2014-07-13 DIAGNOSIS — Z23 Encounter for immunization: Secondary | ICD-10-CM

## 2014-07-13 MED ORDER — TRAZODONE HCL 100 MG PO TABS: 100.0000 mg | ORAL_TABLET | Freq: Every day | ORAL | Status: DC

## 2014-07-13 MED ORDER — ALPRAZOLAM 0.5 MG PO TABS: 0.5000 mg | ORAL_TABLET | Freq: Two times a day (BID) | ORAL | Status: DC | PRN

## 2014-07-13 MED ORDER — ESOMEPRAZOLE MAGNESIUM 20 MG PO PACK: 20.0000 mg | PACK | Freq: Two times a day (BID) | ORAL | Status: DC

## 2014-07-13 NOTE — Progress Notes (Signed)
Pre visit review using our clinic review tool, if applicable. No additional management support is needed unless otherwise documented below in the visit note. 

## 2014-07-13 NOTE — Patient Instructions (Signed)
Generalized Anxiety Disorder Generalized anxiety disorder (GAD) is a mental disorder. It interferes with life functions, including relationships, work, and school. GAD is different from normal anxiety, which everyone experiences at some point in their lives in response to specific life events and activities. Normal anxiety actually helps us prepare for and get through these life events and activities. Normal anxiety goes away after the event or activity is over.  GAD causes anxiety that is not necessarily related to specific events or activities. It also causes excess anxiety in proportion to specific events or activities. The anxiety associated with GAD is also difficult to control. GAD can vary from mild to severe. People with severe GAD can have intense waves of anxiety with physical symptoms (panic attacks).  SYMPTOMS The anxiety and worry associated with GAD are difficult to control. This anxiety and worry are related to many life events and activities and also occur more days than not for 6 months or longer. People with GAD also have three or more of the following symptoms (one or more in children):  Restlessness.   Fatigue.  Difficulty concentrating.   Irritability.  Muscle tension.  Difficulty sleeping or unsatisfying sleep. DIAGNOSIS GAD is diagnosed through an assessment by your health care provider. Your health care provider will ask you questions aboutyour mood,physical symptoms, and events in your life. Your health care provider may ask you about your medical history and use of alcohol or drugs, including prescription medicines. Your health care provider may also do a physical exam and blood tests. Certain medical conditions and the use of certain substances can cause symptoms similar to those associated with GAD. Your health care provider may refer you to a mental health specialist for further evaluation. TREATMENT The following therapies are usually used to treat GAD:    Medication. Antidepressant medication usually is prescribed for long-term daily control. Antianxiety medicines may be added in severe cases, especially when panic attacks occur.   Talk therapy (psychotherapy). Certain types of talk therapy can be helpful in treating GAD by providing support, education, and guidance. A form of talk therapy called cognitive behavioral therapy can teach you healthy ways to think about and react to daily life events and activities.  Stress managementtechniques. These include yoga, meditation, and exercise and can be very helpful when they are practiced regularly. A mental health specialist can help determine which treatment is best for you. Some people see improvement with one therapy. However, other people require a combination of therapies. Document Released: 02/09/2013 Document Revised: 03/01/2014 Document Reviewed: 02/09/2013 Exit[employer] Patient Information 2015 [employer]. This information is not intended to replace advice given to you by your health care provider. Make sure you discuss any questions you have with your health care provider.  

## 2014-07-13 NOTE — Progress Notes (Signed)
Subjective:    Patient ID: Alexandria Beck, female    DOB: [birth year], 80 y.o.   MRN: [id number]  Hypertension This is a chronic problem. The current episode started more than 1 year ago. The problem is unchanged. The problem is controlled. Associated symptoms include anxiety. Pertinent negatives include no blurred vision, chest pain, headaches, malaise/fatigue, neck pain, orthopnea, palpitations, peripheral edema, PND, shortness of breath or sweats. There are no associated agents to hypertension. Past treatments include beta blockers. The current treatment provides mild improvement. Compliance problems include diet and exercise.       Review of Systems  Constitutional: Negative.  Negative for fever, chills, malaise/fatigue, diaphoresis, appetite change and fatigue.  HENT: Negative.   Eyes: Negative.  Negative for blurred vision.  Respiratory: Negative.  Negative for cough, choking, chest tightness, shortness of breath and stridor.   Cardiovascular: Negative.  Negative for chest pain, palpitations, orthopnea, leg swelling and PND.  Gastrointestinal: Negative.  Negative for nausea, vomiting, abdominal pain, diarrhea, constipation and blood in stool.  Endocrine: Negative.   Genitourinary: Negative.   Musculoskeletal: Negative.  Negative for arthralgias, back pain, gait problem, joint swelling, myalgias, neck pain and neck stiffness.  Skin: Negative.   Allergic/Immunologic: Negative.   Neurological: Negative.  Negative for headaches.  Hematological: Negative.   Psychiatric/Behavioral: Positive for sleep disturbance (FA's with panic and nightmares). Negative for suicidal ideas, hallucinations, behavioral problems, confusion, self-injury, dysphoric mood, decreased concentration and agitation. The patient is nervous/anxious. The patient is not hyperactive.        Objective:   Physical Exam  Vitals reviewed. Constitutional: She is oriented to person, place, and time. She appears well-developed  and well-nourished. No distress.  HENT:  Head: Normocephalic and atraumatic.  Mouth/Throat: Oropharynx is clear and moist. No oropharyngeal exudate.  Eyes: Conjunctivae are normal. Right eye exhibits no discharge. Left eye exhibits no discharge. No scleral icterus.  Neck: Normal range of motion. Neck supple. No JVD present. No tracheal deviation present. No thyromegaly present.  Cardiovascular: Normal rate, regular rhythm, S1 normal, S2 normal and intact distal pulses.  Exam reveals no gallop, no S3, no S4 and no friction rub.   Murmur heard.  Decrescendo systolic murmur is present with a grade of 1/6  Pulmonary/Chest: Effort normal and breath sounds normal. No stridor. No respiratory distress. She has no wheezes. She has no rales. She exhibits no tenderness.  Abdominal: Soft. Bowel sounds are normal. She exhibits no distension and no mass. There is no tenderness. There is no rebound and no guarding.  Musculoskeletal: Normal range of motion. She exhibits no edema and no tenderness.  Lymphadenopathy:    She has no cervical adenopathy.  Neurological: She is oriented to person, place, and time.  Skin: Skin is warm and dry. No rash noted. She is not diaphoretic. No erythema. No pallor.  Psychiatric: Her behavior is normal. Judgment and thought content normal. Her mood appears anxious. Her affect is not angry, not blunt, not labile and not inappropriate. Her speech is not rapid and/or pressured, not delayed, not tangential and not slurred. She is not slowed, not withdrawn and not actively hallucinating. Cognition and memory are normal. Cognition and memory are not impaired. She does not express impulsivity or inappropriate judgment. She does not exhibit a depressed mood. She expresses no homicidal and no suicidal ideation. She expresses no suicidal plans and no homicidal plans. She is communicative. She exhibits normal recent memory and normal remote memory. She is attentive.  Assessment &  Plan:

## 2014-07-13 NOTE — Assessment & Plan Note (Signed)
She will cont xanax as needed I have asked her to start trazodone as well, will start at a low dose and will slowly increase if indicated

## 2014-07-13 NOTE — Assessment & Plan Note (Signed)
I have asked her to start trazodone for this

## 2014-07-13 NOTE — Assessment & Plan Note (Signed)
Her BP is adequately well controlled 

## 2014-07-14 ENCOUNTER — Telehealth: Payer: Self-pay | Admitting: *Deleted

## 2014-07-14 NOTE — ED Provider Notes (Signed)
I saw and evaluated the patient, reviewed the resident's note and I agree with the findings and plan.   EKG Interpretation   Date/Time:  Thursday July 08 2014 08:02:09 EDT Ventricular Rate:  79 PR Interval:  174 QRS Duration: 74 QT Interval:  390 QTC Calculation: 447 R Axis:   55 Text Interpretation:  Normal sinus rhythm Left atrial enlargement Septal  infarct , age undetermined No significant change since last tracing  Confirmed by Wilson Singer  MD, [place] ([id number]) on 07/08/2014 8:14:51 AM      80 year old female with chest pain? She describes more a "funny feeling" or "tightness." Her heart score is 1. Have very low suspicion for ACS. For other potential emergent etiology such as pulmonary embolism, dissection, serious infection or other.  Virgel Manifold, MD 07/14/14 [phone]

## 2014-07-14 NOTE — Telephone Encounter (Signed)
[facility] Triage Call Report Triage Record Num: [id number] Operator: Liana Gerold Patient Name: Alexandria Beck Call Date & Time: 07/13/2014 8:12:11AM Patient Phone: [phone] PCP: Doroteo Bradford. Hooper Patient Gender: Female PCP Fax : Patient DOB: [birth year] Practice Name: [facility] Reason for Call: Caller: Alexandria Beck/Patient; PCP: Unice Cobble; CB#: [phone]; Call regarding Anxiety at night, able to go to sleep , then wake up anxious; Onset 06/12/14 Pt reports she is not sleeping well due to anxiety. All emergent symptoms ruled out per Anxiety protocol with exception "Feeling overwhelmed and unable to calm down." Home care advice given. Per disposition see provider within 24 hours appt scheduled for 1015, 07/13/14 with Dr Ronnald Ramp. Protocol(s) Used: Anxiety Recommended Outcome per Protocol: See Provider within 24 hours Reason for Outcome: Feeling overwhelmed AND unable to calm down Care Advice: ~ 09/

## 2014-07-29 ENCOUNTER — Encounter: Payer: Self-pay | Admitting: Internal Medicine

## 2014-07-29 ENCOUNTER — Ambulatory Visit (INDEPENDENT_AMBULATORY_CARE_PROVIDER_SITE_OTHER): Payer: Commercial Managed Care - HMO | Admitting: Internal Medicine

## 2014-07-29 VITALS — BP 165/88 | HR 62 | Ht 61.5 in | Wt 169.3 lb

## 2014-07-29 DIAGNOSIS — R0789 Other chest pain: Secondary | ICD-10-CM

## 2014-07-29 DIAGNOSIS — F419 Anxiety disorder, unspecified: Secondary | ICD-10-CM

## 2014-07-29 DIAGNOSIS — R011 Cardiac murmur, unspecified: Secondary | ICD-10-CM

## 2014-07-29 DIAGNOSIS — I493 Ventricular premature depolarization: Secondary | ICD-10-CM

## 2014-07-29 MED ORDER — VALSARTAN 80 MG PO TABS: 80.0000 mg | ORAL_TABLET | Freq: Every day | ORAL | Status: DC

## 2014-07-29 NOTE — Progress Notes (Signed)
OFFICE NOTE  Chief Complaint:  Palpitations  Primary Care Physician: Garnette Czech, MD  HPI:  Alexandria Beck is a pleasant 80 year old female is currently referred to me by Dr. Huey Bienenstock for evaluation of palpitations. She has relatively few medical problems including mild hypertension, some anxiety and poor sleep as well as reflux disease. Her family history is significant for longevity as her mother died of stroke at 79. There is no premature coronary disease. She has a history of palpitations who she said sometimes is worse at night or at times when she wakes up from vivid dreams. Generally, however she is not particularly bothered by them. She recently had an EKG which showed PVCs. She probably had an episode of shortness of breath in February and was sent in the emergency room. She was noted to have PVCs and palpitations then and ruled out for MI. It was felt that her symptoms were possibly do to anxiety. She denies any chest pain or worsening shortness of breath. She is quite active in fact walks to a center and does exercise on some treadmills and other machines. She denies any difficulty doing this.  She has been told that she has a murmur before and it was clearly auscultated today. She has not had prior workup of his heart murmur.  Alexandria Beck returns today for followup. She reports improvement in her palpitations on increased dose Lopressor. Her blood pressure remains elevated. Her echocardiogram showed normal systolic function with mild aortic sclerosis but no aortic stenosis. This is what I had suspected.  PMHx:  Past Medical History  Diagnosis Date  . Near syncope     diagnosed as anxiety related. gets some relief with clonazepam. No formal testing done.  [name] GERD (gastroesophageal reflux disease)     has had EGD 2006-negative  . Allergy     seasonal  . Irregular heart beat     no testing, holter, etc. Takes no medication  . Hypertension      On no medication. Home readings  SBP=150's  . Diverticulosis     2004  . Internal hemorrhoids   . Esophageal stricture     Past Surgical History  Procedure Laterality Date  . Abdominal hysterectomy  1974    fibroid tumors w/ metorrhagia  . Tonsillectomy    . G4p4 - nsvd  midwife delivery for two    . Tonsillectomy      FAMHx:  Family History  Problem Relation Age of Onset  . Heart disease Mother   . Stroke Mother   . Hypertension Mother   . Cancer Father     lung cancer - smoker  . Heart disease Sister     MI  . Kidney disease Sister     end stage renal disease on HD  . Diabetes Sister   . Heart disease Brother   . Diabetes Sister   . Hypertension Sister   . Kidney disease Sister   . Cancer Sister   . Diabetes Sister   . Kidney disease Sister     HD  . Hypertension Sister   . Heart disease Sister   . Hypertension Sister   . Heart disease Sister   . Hypertension Sister   . Hypertension Sister     SOCHx:   reports that she has never smoked. She has never used smokeless tobacco. She reports that she does not drink alcohol or use illicit drugs.  ALLERGIES:  Allergies  Allergen Reactions  . Clonazepam Swelling and  Other (See Comments)    Tongue thickening sensation  . Codeine Rash  . Aspirin Nausea And Vomiting and Other (See Comments)    Stomach upset    ROS: A comprehensive review of systems was negative except for: Cardiovascular: positive for palpitations  HOME MEDS: Current Outpatient Prescriptions  Medication Sig Dispense Refill  . ALPRAZolam (XANAX) 0.5 MG tablet Take 1 tablet (0.5 mg total) by mouth 2 (two) times daily as needed for anxiety or sleep.  60 tablet  1  . ARTIFICIAL TEAR OP Place 1 drop into both eyes 2 (two) times daily.      [name] esomeprazole (NEXIUM) 20 MG packet Take 20 mg by mouth 2 (two) times daily.  60 each  11  . Esomeprazole Magnesium (NEXIUM 24HR PO) Take 22.3 mg by mouth 2 (two) times daily.       . metoprolol tartrate (LOPRESSOR) 25 MG tablet Take 1.5  tablets (37.5 mg total) by mouth 2 (two) times daily.  90 tablet  6  . Omega-3 Fatty Acids (OMEGA ESSENTIALS BASIC PO) Take 3 capsules by mouth 3 (three) times daily. Omega XL      . OVER THE COUNTER MEDICATION Apply 1 application topically daily. "Two Old Goats - Moisturizing lotion"      . traZODone (DESYREL) 100 MG tablet Take 1 tablet (100 mg total) by mouth at bedtime.  90 tablet  3  . vitamin C (ASCORBIC ACID) 500 MG tablet Take 500 mg by mouth daily.      . valsartan (DIOVAN) 80 MG tablet Take 1 tablet (80 mg total) by mouth daily.  30 tablet  6   No current facility-administered medications for this visit.    LABS/IMAGING: No results found for this or any previous visit (from the past 48 hour(s)). No results found.  VITALS: BP 165/88  Pulse 62  Ht 5' 1.5" (1.562 m)  Wt 169 lb 4.8 oz (76.794 kg)  BMI 31.47 kg/m2  EXAM: deferred  EKG: deferred  ASSESSMENT: 1. Palpitations - improved 2. PVCs 3. Hypertension - not at goal 4. Aortic sclerosis  PLAN: 1.   Alexandria Beck has had some improvement in her palpitations with an increased dose and beta blocker. I believe a lot of this is related to anxiety. She was seen in the emergency room for similar symptoms and ruled out for MI. She is now taking Xanax twice daily which has been helpful. Advise she can take even an additional half dose when she has symptoms. Her blood pressure is still not at goal. I would recommend adding Diovan 80 mg daily to her regimen. There is some data that ACE inhibitors or ARB medications can help slow the progression of aortic sclerosis. We will schedule followup with Erasmo Downer, our hypertension pharmacist in 2 weeks for reevaluation.  Plan to see her back in 6 months.  Pixie Casino, MD, [facility] Attending Cardiologist [facility]  HILTY,Kenneth C 07/29/2014, 1:15 PM

## 2014-07-29 NOTE — Patient Instructions (Signed)
Your physician has recommended you make the following change in your medication: START valsartan 80mg  once daily  Please make appointment with Erasmo Downer (pharmacist) in 2 weeks for BP check - please bring a log of your blood pressure/heart rate to this visit  Your physician wants you to follow-up in: 6 months. You will receive a reminder letter in the mail two months in advance. If you don't receive a letter, please call our office to schedule the follow-up appointment.

## 2014-08-13 ENCOUNTER — Ambulatory Visit (INDEPENDENT_AMBULATORY_CARE_PROVIDER_SITE_OTHER): Payer: Commercial Managed Care - HMO | Admitting: Pharmacist Clinician (PhC)/ Clinical Pharmacy Specialist

## 2014-08-13 VITALS — BP 124/76 | Ht 61.5 in | Wt 168.0 lb

## 2014-08-13 DIAGNOSIS — I10 Essential (primary) hypertension: Secondary | ICD-10-CM

## 2014-08-13 NOTE — Patient Instructions (Addendum)
Call if you notice your blood pressure continually >150/90  Your BP today was 126/68  Check your BP at home several times each week     Continue with all your current medications  Bring all of your meds, your BP cuff and your record of home blood pressures to your next appointment.  Exercise as you're able, try to walk approximately 30 minutes per day.  Keep salt intake to a minimum, especially watch canned and prepared boxed foods.  Eat more fresh fruits and vegetables and fewer canned items.  Avoid eating in fast food restaurants.    HOW TO TAKE YOUR BLOOD PRESSURE:   Rest 5 minutes before taking your blood pressure.    Don't smoke or drink caffeinated beverages for at least 30 minutes before.   Take your blood pressure before (not after) you eat.   Sit comfortably with your back supported and both feet on the floor (don't cross your legs).   Elevate your arm to heart level on a table or a desk.   Use the proper sized cuff. It should fit smoothly and snugly around your bare upper arm. There should be enough room to slip a fingertip under the cuff. The bottom edge of the cuff should be 1 inch above the crease of the elbow.   Ideally, take 3 measurements at one sitting and record the average.

## 2014-08-16 NOTE — Assessment & Plan Note (Signed)
Her home BP readings have improved since she started the valsartan and are mostly within normal limits.   Today her office reading was 126/68.  I have asked her to continue with home BP monitoring several times each week, and to let us know if she sees any significant increase in the readings.  I will see her again as needed

## 2014-08-16 NOTE — Progress Notes (Signed)
08/16/2014 Alexandria Beck 12-16-1930 [id number]   HPI:  Alexandria Beck is a 80 y.o. female patient of Dr Debara Pickett, with a PMH below who presents today for hypertension clinic evaluation.   Dr. Debara Pickett saw her several weeks ago for palpitations, which may be related to anxiety.  She takes alprazolam twice daily.  Her BP at that visit was 165/88.  She was started on valsartan 80mg  once daily.  She reports no problems with the medication.  She does have a home BP cuff, and within a week of starting the valsartan her readings began to improve.  No complaints of dizziness or lightheadedness upon standing or positional changes.  Alexandria Beck comes from a large family, 10 children, of whom 6 (including herself) have hypertension.  Three sisters are deceased, all having dealt with diabetes and kidney disease.  Her mother, though hypertensive, lived to be 16, when she died of a stroke.  She is unaware of any heart disease in her father's family, he died of lung cancer .    She does not smoke nor drink alcohol.  She regularly exercises, doing treadmill, eliptical or bike, often walking to a nearby gym.     Current Outpatient Prescriptions  Medication Sig Dispense Refill  . ALPRAZolam (XANAX) 0.5 MG tablet Take 1 tablet (0.5 mg total) by mouth 2 (two) times daily as needed for anxiety or sleep.  60 tablet  1  . ARTIFICIAL TEAR OP Place 1 drop into both eyes 2 (two) times daily.      [name] esomeprazole (NEXIUM) 20 MG packet Take 20 mg by mouth 2 (two) times daily.  60 each  11  . Esomeprazole Magnesium (NEXIUM 24HR PO) Take 22.3 mg by mouth 2 (two) times daily.       . metoprolol tartrate (LOPRESSOR) 25 MG tablet Take 1.5 tablets (37.5 mg total) by mouth 2 (two) times daily.  90 tablet  6  . Omega-3 Fatty Acids (OMEGA ESSENTIALS BASIC PO) Take 3 capsules by mouth 3 (three) times daily. Omega XL      . OVER THE COUNTER MEDICATION Apply 1 application topically daily. "Two Old Goats - Moisturizing lotion"      .  traZODone (DESYREL) 100 MG tablet Take 1 tablet (100 mg total) by mouth at bedtime.  90 tablet  3  . valsartan (DIOVAN) 80 MG tablet Take 1 tablet (80 mg total) by mouth daily.  30 tablet  6  . vitamin C (ASCORBIC ACID) 500 MG tablet Take 500 mg by mouth daily.       No current facility-administered medications for this visit.    Allergies  Allergen Reactions  . Clonazepam Swelling and Other (See Comments)    Tongue thickening sensation  . Codeine Rash  . Aspirin Nausea And Vomiting and Other (See Comments)    Stomach upset    Past Medical History  Diagnosis Date  . Near syncope     diagnosed as anxiety related. gets some relief with clonazepam. No formal testing done.  [name] GERD (gastroesophageal reflux disease)     has had EGD 2006-negative  . Allergy     seasonal  . Irregular heart beat     no testing, holter, etc. Takes no medication  . Hypertension      On no medication. Home readings SBP=150's  . Diverticulosis     2004  . Internal hemorrhoids   . Esophageal stricture     Blood pressure 124/76, height 5' 1.5" (1.562  m), weight 168 lb (76.204 kg).   ASSESSMENT AND PLAN:  Tommy Medal PharmD CPP [facility]

## 2014-09-17 ENCOUNTER — Telehealth: Payer: Self-pay | Admitting: Gastroenterology

## 2014-09-20 NOTE — Telephone Encounter (Signed)
Spoke with patient. She had previously been doing well on Nexium. Past 2-3 months, despite nexium and careful diet, she has had abdominal discomfort and burning. She has added TUMS but the symptoms continue to bother her.. Appointment scheduled for evaluation.

## 2014-09-20 NOTE — Telephone Encounter (Signed)
I have left message for the patient to call back. Is she taking the Nexium?

## 2014-09-21 ENCOUNTER — Other Ambulatory Visit (INDEPENDENT_AMBULATORY_CARE_PROVIDER_SITE_OTHER): Payer: Commercial Managed Care - HMO

## 2014-09-21 ENCOUNTER — Ambulatory Visit (INDEPENDENT_AMBULATORY_CARE_PROVIDER_SITE_OTHER): Payer: Commercial Managed Care - HMO | Admitting: Physician Assistant

## 2014-09-21 ENCOUNTER — Encounter: Payer: Self-pay | Admitting: Physician Assistant

## 2014-09-21 VITALS — BP 122/78 | HR 80 | Ht 60.5 in | Wt 167.5 lb

## 2014-09-21 DIAGNOSIS — K219 Gastro-esophageal reflux disease without esophagitis: Secondary | ICD-10-CM

## 2014-09-21 DIAGNOSIS — R1013 Epigastric pain: Secondary | ICD-10-CM

## 2014-09-21 LAB — CBC WITH DIFFERENTIAL/PLATELET
Basophils Absolute: 0 10*3/uL (ref 0.0–0.1)
Basophils Relative: 0.6 % (ref 0.0–3.0)
EOS PCT: 2.9 % (ref 0.0–5.0)
Eosinophils Absolute: 0.2 10*3/uL (ref 0.0–0.7)
HEMATOCRIT: 40.9 % (ref 36.0–46.0)
Hemoglobin: 13.5 g/dL (ref 12.0–15.0)
Lymphocytes Relative: 53.6 % — ABNORMAL HIGH (ref 12.0–46.0)
Lymphs Abs: 4.1 10*3/uL — ABNORMAL HIGH (ref 0.7–4.0)
MCHC: 33 g/dL (ref 30.0–36.0)
MCV: 92 fl (ref 78.0–100.0)
Monocytes Absolute: 0.5 10*3/uL (ref 0.1–1.0)
Monocytes Relative: 6.4 % (ref 3.0–12.0)
NEUTROS PCT: 36.5 % — AB (ref 43.0–77.0)
Neutro Abs: 2.8 10*3/uL (ref 1.4–7.7)
PLATELETS: 307 10*3/uL (ref 150.0–400.0)
RBC: 4.44 Mil/uL (ref 3.87–5.11)
RDW: 12.9 % (ref 11.5–15.5)
WBC: 7.7 10*3/uL (ref 4.0–10.5)

## 2014-09-21 LAB — HEPATIC FUNCTION PANEL
ALK PHOS: 61 U/L (ref 39–117)
ALT: 15 U/L (ref 0–35)
AST: 23 U/L (ref 0–37)
Albumin: 4.2 g/dL (ref 3.5–5.2)
BILIRUBIN TOTAL: 0.4 mg/dL (ref 0.2–1.2)
Bilirubin, Direct: 0.1 mg/dL (ref 0.0–0.3)
Total Protein: 7.6 g/dL (ref 6.0–8.3)

## 2014-09-21 LAB — LIPASE: LIPASE: 21 U/L (ref 11.0–59.0)

## 2014-09-21 MED ORDER — ESOMEPRAZOLE MAGNESIUM 40 MG PO CPDR: DELAYED_RELEASE_CAPSULE | ORAL | Status: DC

## 2014-09-21 MED ORDER — SUCRALFATE 1 G PO TABS: ORAL_TABLET | ORAL | Status: DC

## 2014-09-21 NOTE — Progress Notes (Signed)
Patient ID: Alexandria Beck, female   DOB: 17-Feb-1931, 80 y.o.   MRN: [id number]   Subjective:    Patient ID: Alexandria Beck, female    DOB: 26-Mar-1931, 80 y.o.   MRN: [id number]  HPI Alexandria Beck is a pleasant 80 year old African-American female known to Alexandria Beck. She has history of hypertension, anxiety, PVCs, diverticular disease and GERD. She had undergone colonoscopy last in 2007 for anemia. This showed scattered diverticulosis and was otherwise negative. EGD was last done in 2013 she had an early distal stricture which was Alexandria Beck dilated otherwise negative exam. She has been on PPI therapy chronically for reflux. She is currently taking Nexium 20 mg twice daily and states that she's been having problems over the past couple of months with a burning sensation in her lower chest and fullness in the epigastrium. She says her symptoms don't necessarily seem to be related to eating or postprandially. She has also noted increased belching. No dysphagia or odynophagia. No changes in bowel habits melena or hematochezia and no nausea or vomiting or fever. Her appetite is fair her weight is stable he also describes some burning in the epigastric region. She is not on any regular aspirin or NSAIDs and is taking an herbal supplement for fibromyalgia. She relates that she was treated for a" bacteria "" in her stomach by Alexandria Beck a few months back with a course of antibiotics but did not feel any better or different after this. No prior abdominal surgery other than hysterectomy.  Review of Systems Pertinent positive and negative review of systems were noted in the above HPI section.  All other review of systems was otherwise negative.  Outpatient Encounter Prescriptions as of 09/21/2014  Medication Sig  . ALPRAZolam (XANAX) 0.5 MG tablet Take 1 tablet (0.5 mg total) by mouth 2 (two) times daily as needed for anxiety or sleep.  [name] ARTIFICIAL TEAR OP Place 1 drop into both eyes 2 (two) times daily.  . DiphenhydrAMINE HCl  (BENADRYL PO) Take 1 tablet by mouth daily.  . metoprolol tartrate (LOPRESSOR) 25 MG tablet Take 1.5 tablets (37.5 mg total) by mouth 2 (two) times daily.  . Omega-3 Fatty Acids (OMEGA ESSENTIALS BASIC PO) Take 3 capsules by mouth 3 (three) times daily. Omega XL  . OVER THE COUNTER MEDICATION Apply 1 application topically daily. "Two Old Goats - Moisturizing lotion"  . traZODone (DESYREL) 100 MG tablet Take 1 tablet (100 mg total) by mouth at bedtime.  . valsartan (DIOVAN) 80 MG tablet Take 1 tablet (80 mg total) by mouth daily.  . vitamin C (ASCORBIC ACID) 500 MG tablet Take 500 mg by mouth daily.  . [DISCONTINUED] esomeprazole (NEXIUM) 20 MG packet Take 20 mg by mouth 2 (two) times daily.  . [DISCONTINUED] Esomeprazole Magnesium (NEXIUM 24HR PO) Take 22.3 mg by mouth 2 (two) times daily.   [name] esomeprazole (NEXIUM) 40 MG capsule Take 1 cap twice daily for 30 days then go to once daily.  . sucralfate (CARAFATE) 1 G tablet Take 1 tab 3 times daily between meals   Allergies  Allergen Reactions  . Clonazepam Swelling and Other (See Comments)    Tongue thickening sensation  . Codeine Rash  . Aspirin Nausea And Vomiting and Other (See Comments)    Stomach upset   Patient Active Problem List   Diagnosis Date Noted  . Insomnia 07/08/2014  . Chest discomfort 07/08/2014  . PVC's (premature ventricular contractions) 05/20/2014  . Murmur 05/20/2014  . Dysphagia 11/12/2013  . Routine  health maintenance 10/14/2013  . Other and unspecified hyperlipidemia 10/13/2013  . Chronic back pain 10/13/2013  . 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 Paresthesia of lower extremity 01/08/2013  . Paresthesia of hand 06/18/2012  . Urticaria 08/28/2011  . Anxiety 08/19/2011  . Near syncope   . GERD (gastroesophageal reflux disease)   . Hypertension    History   Social History  . Marital Status: Widowed    Spouse Name: N/A    Number of Children: 4  . Years of  Education: 11   Occupational History  . stock person     retired   Social History Main Topics  . Smoking status: Never Smoker   . Smokeless tobacco: Never Used  . Alcohol Use: No  . Drug Use: No  . Sexual Activity: Not Currently   Other Topics Concern  . Not on file   Social History Narrative   11th grade. Married '52- '92/widowed. 2 sons- '55, '63; 2 dtrs - '54, '60. 12 grandchildren. 7 greatgrands. Lives in own home with a son who is in and out. Work - [employer], Personnel officer.      Do you drink Alcoholic Beverages-No. Do you drink caffienated Beverages- No. Do you use seatbelt often- Yes. Do you exercise at least 3 times a week- . Do you take vitamins- . Do you use a hearing aid- No. Do you wear dentures- Yes. Is there a smoke alarm in your house- Yes. Are there guns and firearms in your household- . Have you experienced physical abuse- No.Usual # of hours of sleep per night 3. Number of people living at your residence- 2                         Alexandria Beck family history includes Cancer in her father and sister; Diabetes in her sister, sister, and sister; Heart disease in her brother, mother, sister, sister, and sister; Hypertension in her mother, sister, sister, sister, sister, and sister; Kidney disease in her sister, sister, and sister; Stroke in her mother.      Objective:    Filed Vitals:   09/21/14 1441  BP: 122/78  Pulse: 80    Physical Exam  well-developed elderly African-American female in no acute distress, pleasant height 5 foot weight 167. HEENT; nontraumatic normocephalic EOMI PERRLA sclera anicteric, Supple; no JVD, Cardiovascular; regular rate and rhythm with S1-S2 no murmur or gallop, Pulmonary; clear bilaterally, Abdomen ;soft she is tender in the epigastrium there is no guarding or rebound no palpable mass or hepatosplenomegaly bowel sounds are present done, Extremities ;no clubbing cyanosis or edema skin warm and dry, Psych; mood and affect  appropriate       Assessment & Plan:   #50 80 year old female with history of chronic GERD with complaints of epigastric discomfort, fullness and burning in the epigastrium and lower chest despite twice a day low dose Nexium. Rule out refractory GERD gastritis, peptic ulcer disease, occult pancreatic lesion. Patient had been treated for possible H. pylori without improvement in symptoms previously. #2 diverticulosis #3 history of fibromyalgia 4 hypertension  Plan; We will increase Nexium to 40 mg by mouth twice daily 1 month and then decrease to 40 mg once daily Add Carafate 1 g 3 times daily between meals 2-3 weeks Check H. pylori stool antigen Schedule for upper abdominal ultrasound CBC with differential lipase and hepatic panel Follow-up office visit in 3-4 weeks or sooner when necessary  Amy Genia Harold PA-C 09/21/2014

## 2014-09-21 NOTE — Patient Instructions (Signed)
Please go to the basement level to have your labs drawn and for a stool test. We sent prescriptions to [place]. 1. Carafate Tablets. 2. Nexium 40 mg take 1 capsule twice daily for 30 days then take 1 capsule daily.  Follow up with Amy Esterwood PA in 3-4 weeks.  You have been scheduled for an abdominal ultrasound at [facility] (1st floor of hospital) on 09-28-2014 at 9:00 am . Please arrive 8:45 am prior to your appointment for registration. Make certain not to have anything to eat or drink 6 hours prior to your appointment. Should you need to reschedule your appointment, please contact radiology at [phone]. This test typically takes about 30 minutes to perform.

## 2014-09-22 ENCOUNTER — Telehealth: Payer: Self-pay | Admitting: Physician Assistant

## 2014-09-22 ENCOUNTER — Telehealth: Payer: Self-pay | Admitting: *Deleted

## 2014-09-22 ENCOUNTER — Other Ambulatory Visit: Payer: Self-pay | Admitting: Physician Assistant

## 2014-09-22 ENCOUNTER — Other Ambulatory Visit: Payer: Commercial Managed Care - HMO

## 2014-09-22 MED ORDER — OMEPRAZOLE 40 MG PO CPDR: DELAYED_RELEASE_CAPSULE | ORAL | Status: DC

## 2014-09-22 NOTE — Telephone Encounter (Signed)
Prescription sent

## 2014-09-22 NOTE — Telephone Encounter (Signed)
I called [facility] with a new rx for Omeprazole 40 mg, take 1 cap twice daily before breakfast and dinner for 30 day then go to once daily.  I LM on pharmacist Ans macning.

## 2014-09-22 NOTE — Telephone Encounter (Signed)
I called the patient and left a message for her that we sent Omeprazole 40 mg to her pharmacy. She can take 1 cap twice daily before breakfast and dinner for 1 month then go to once daily.

## 2014-09-23 LAB — HELICOBACTER PYLORI  SPECIAL ANTIGEN: H. PYLORI Antigen: NEGATIVE

## 2014-09-27 NOTE — Progress Notes (Signed)
Reviewed and agree with management.  If workup is negative and patient continues to have abdominal pain would repeat upper endoscopy Sandy Salaam. Deatra Ina, M.D., [facility]

## 2014-09-28 ENCOUNTER — Ambulatory Visit (HOSPITAL_COMMUNITY)
Admission: RE | Admit: 2014-09-28 | Discharge: 2014-09-28 | Disposition: A | Discharge status: Home / Self Care | Payer: Commercial Managed Care - HMO | Source: Ambulatory Visit | Attending: Physician Assistant | Admitting: Physician Assistant

## 2014-09-28 DIAGNOSIS — K219 Gastro-esophageal reflux disease without esophagitis: Secondary | ICD-10-CM | POA: Diagnosis present

## 2014-09-28 DIAGNOSIS — R1013 Epigastric pain: Secondary | ICD-10-CM

## 2014-10-06 ENCOUNTER — Telehealth: Payer: Self-pay | Admitting: Internal Medicine

## 2014-10-06 NOTE — Telephone Encounter (Signed)
Patient states she asked Dr. [name] to take her on as a patient after Dr. Linda Hedges retired.  She states he said yes.  I told her that it looked like Marliss Czar was her PCP but she stated that was incorrect.  She is requesting something to be called in for her allergies.  I told her she would have to be seen and she stated she could not afford to come in for an OV.  Please advise.

## 2014-10-06 NOTE — Telephone Encounter (Signed)
Patient has been advised. No further questions/concerns.

## 2014-10-06 NOTE — Telephone Encounter (Signed)
Plain Mucinex (NOT D) for thick secretions ;force NON dairy fluids .  Plain Allegra (NOT D )  160 daily , Loratidine 10 mg , OR Zyrtec 10 mg @ bedtime  as needed for itchy eyes & sneezing. It will be necessary for her to establish with a primary care physician ;I am not taking new patients at this time as I shall be retiring on/1/17.

## 2014-10-15 ENCOUNTER — Encounter: Payer: Self-pay | Admitting: Physician Assistant

## 2014-10-15 ENCOUNTER — Ambulatory Visit (INDEPENDENT_AMBULATORY_CARE_PROVIDER_SITE_OTHER): Payer: Commercial Managed Care - HMO | Admitting: Physician Assistant

## 2014-10-15 VITALS — BP 140/88 | HR 80 | Ht 60.05 in | Wt 167.0 lb

## 2014-10-15 DIAGNOSIS — K219 Gastro-esophageal reflux disease without esophagitis: Secondary | ICD-10-CM

## 2014-10-15 DIAGNOSIS — F419 Anxiety disorder, unspecified: Secondary | ICD-10-CM

## 2014-10-15 DIAGNOSIS — R1013 Epigastric pain: Secondary | ICD-10-CM

## 2014-10-15 DIAGNOSIS — F519 Sleep disorder not due to a substance or known physiological condition, unspecified: Secondary | ICD-10-CM

## 2014-10-15 MED ORDER — SUCRALFATE 1 GM/10ML PO SUSP: 1.0000 g | Freq: Three times a day (TID) | ORAL | Status: DC

## 2014-10-15 MED ORDER — OMEPRAZOLE 40 MG PO CPDR: DELAYED_RELEASE_CAPSULE | ORAL | Status: DC

## 2014-10-15 MED ORDER — ALPRAZOLAM 0.5 MG PO TABS: 0.5000 mg | ORAL_TABLET | Freq: Two times a day (BID) | ORAL | Status: DC | PRN

## 2014-10-15 NOTE — Progress Notes (Signed)
Patient ID: Alexandria Beck, female   DOB: 25-Jan-1931, 80 y.o.   MRN: [id number]   Subjective:    Patient ID: Alexandria Beck, female    DOB: November 27, 1930, 80 y.o.   MRN: [id number]  HPI Alexandria Beck out is a very nice 80 year old African-American female known to Alexandria Beck. She has history of chronic GERD, hypertension, anxiety, prior dilation for esophageal stricture and chronic back pain. She was last seen in November 2015 by myself at that time with complaints of epigastric pain with fullness and burning in the upper abdomen and lower chest. She had already been on PPI therapy. We switched her to omeprazole 40 mg by mouth twice daily added Carafate 1 g 4 times daily between meals and at bedtime. There was question about H pylori status. H. pylori stool antigen was done and is negative, CBC and hepatic panel, lipase all normal. We also did an upper abdominal ultrasound which was negative. She comes in today for follow-up and states that she's doing much better. Will occasionally get a little bit of burning or discomfort but overall feels much better. Her appetite is been good and she's been eating well. She asks if she may have a refill for Xanax for one month until she can get back in to see Dr. Ronnald Beck.  Review of Systems Pertinent positive and negative review of systems were noted in the above HPI section.  All other review of systems was otherwise negative.  Outpatient Encounter Prescriptions as of 10/15/2014  Medication Sig  . ALPRAZolam (XANAX) 0.5 MG tablet Take 1 tablet (0.5 mg total) by mouth 2 (two) times daily as needed for anxiety or sleep.  [name] ARTIFICIAL TEAR OP Place 1 drop into both eyes 2 (two) times daily.  . DiphenhydrAMINE HCl (BENADRYL PO) Take 1 tablet by mouth daily.  . metoprolol tartrate (LOPRESSOR) 25 MG tablet Take 1.5 tablets (37.5 mg total) by mouth 2 (two) times daily.  . Omega-3 Fatty Acids (OMEGA ESSENTIALS BASIC PO) Take 3 capsules by mouth 3 (three) times daily. Omega XL  .  omeprazole (PRILOSEC) 40 MG capsule Take 1 tab twice daily  . OVER THE COUNTER MEDICATION Apply 1 application topically daily. "Two Old Goats - Moisturizing lotion"  . sucralfate (CARAFATE) 1 G tablet Take 1 tab 3 times daily between meals  . traZODone (DESYREL) 100 MG tablet Take 1 tablet (100 mg total) by mouth at bedtime.  . valsartan (DIOVAN) 80 MG tablet Take 1 tablet (80 mg total) by mouth daily.  . vitamin C (ASCORBIC ACID) 500 MG tablet Take 500 mg by mouth daily.  . [DISCONTINUED] ALPRAZolam (XANAX) 0.5 MG tablet Take 1 tablet (0.5 mg total) by mouth 2 (two) times daily as needed for anxiety or sleep.  . [DISCONTINUED] omeprazole (PRILOSEC) 40 MG capsule TAKE 1 CAPSULE BY MOUTH BEFORE BREAKFAST AND DINNER FOR 30 DAYS THEN GO TO ONCE DAILY  . sucralfate (CARAFATE) 1 GM/10ML suspension Take 10 mLs (1 g total) by mouth 4 (four) times daily -  with meals and at bedtime.  . [DISCONTINUED] esomeprazole (NEXIUM) 40 MG capsule Take 1 cap twice daily for 30 days then go to once daily.   Allergies  Allergen Reactions  . Clonazepam Swelling and Other (See Comments)    Tongue thickening sensation  . Codeine Rash  . Aspirin Nausea And Vomiting and Other (See Comments)    Stomach upset   Patient Active Problem List   Diagnosis Date Noted  . Insomnia 07/08/2014  . Chest  discomfort 07/08/2014  . PVC's (premature ventricular contractions) 05/20/2014  . Murmur 05/20/2014  . Dysphagia 11/12/2013  . Routine health maintenance 10/14/2013  . Other and unspecified hyperlipidemia 10/13/2013  . Chronic back pain 10/13/2013  . 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 Paresthesia of lower extremity 01/08/2013  . Paresthesia of hand 06/18/2012  . Urticaria 08/28/2011  . Anxiety 08/19/2011  . Near syncope   . GERD (gastroesophageal reflux disease)   . Hypertension    History   Social History  . Marital Status: Widowed    Spouse Name: N/A    Number of  Children: 4  . Years of Education: 11   Occupational History  . stock person     retired   Social History Main Topics  . Smoking status: Never Smoker   . Smokeless tobacco: Never Used  . Alcohol Use: No  . Drug Use: No  . Sexual Activity: Not Currently   Other Topics Concern  . Not on file   Social History Narrative   11th grade. Married '52- '92/widowed. 2 sons- '55, '63; 2 dtrs - '54, '60. 12 grandchildren. 7 greatgrands. Lives in own home with a son who is in and out. Work - [employer], Personnel officer.      Do you drink Alcoholic Beverages-No. Do you drink caffienated Beverages- No. Do you use seatbelt often- Yes. Do you exercise at least 3 times a week- . Do you take vitamins- . Do you use a hearing aid- No. Do you wear dentures- Yes. Is there a smoke alarm in your house- Yes. Are there guns and firearms in your household- . Have you experienced physical abuse- No.Usual # of hours of sleep per night 3. Number of people living at your residence- 2                         Alexandria Beck family history includes Cancer in her father and sister; Diabetes in her sister, sister, and sister; Heart disease in her brother, mother, sister, sister, and sister; Hypertension in her mother, sister, sister, sister, sister, and sister; Kidney disease in her sister, sister, and sister; Stroke in her mother.      Objective:    Filed Vitals:   10/15/14 1337  BP: 140/88  Pulse: 80    Physical Exam   well-developed elderly African-American female in no acute distress height 5 foot weight 167 vitals as above. HEENT; nontraumatic normocephalic EOMI PERRLA sclera anicteric, Supple; no JVD, Cardiovascular; regular rate and rhythm with S1-S2 no murmur or gallop, Pulmonary; clear bilaterally, Abdomen; soft ,nontende,r nondistended bowel sounds are active there is no palpable mass or hepatosplenomegaly, Rectal; exam not done, Extremities; no clubbing cyanosis or edema skin warm and dry, Psych; mood and affect  appropriate    Assessment & Plan:   #1  80 yo female with chronic GERD with 6 week history of epigastric and subxiphoid discomfort much improved with changing PPI therapy and addition of Carafate. Other workup as outlined above unrevealing. I suspect she had a gastritis. #2 chronic anxiety #3 hypertension #4 diverticulosis   #5 history of fibromyalgia   Plan; continue Prilosec 40 mg by mouth twice daily 1 more month and then decrease to once daily Finish current prescription for Carafate between meals and at bedtime then stop Have refilled Xanax 0.5 once or twice daily when necessary for anxiety #60 and no refills. She will follow up with Dr. Ronnald Beck in  January to obtain new prescription. She will follow up with Alexandria Beck or myself as needed   Alfredia Ferguson PA-C 10/15/2014

## 2014-10-15 NOTE — Patient Instructions (Signed)
We faxed the prescription for Xanax to [place], [place]. We sent a prescription to [place], [place] for Prilosec , take twice daily for 1 month , then once daily.  Carafate1 gram, between meals and at bedtime. Finish current prescription you have.   Follow up with Amy as needed.

## 2014-10-17 NOTE — Progress Notes (Signed)
Reviewed and agree with management. Naomii Kreger D. Hawke Villalpando, M.D., FACG  

## 2014-10-18 ENCOUNTER — Other Ambulatory Visit: Payer: Self-pay

## 2014-11-22 ENCOUNTER — Ambulatory Visit (INDEPENDENT_AMBULATORY_CARE_PROVIDER_SITE_OTHER): Payer: Commercial Managed Care - HMO | Admitting: Internal Medicine

## 2014-11-22 ENCOUNTER — Other Ambulatory Visit: Payer: Self-pay | Admitting: Physician Assistant

## 2014-11-22 ENCOUNTER — Encounter: Payer: Self-pay | Admitting: Internal Medicine

## 2014-11-22 VITALS — BP 110/76 | HR 72 | Temp 98.2°F | Ht 60.0 in | Wt 167.8 lb

## 2014-11-22 DIAGNOSIS — F419 Anxiety disorder, unspecified: Secondary | ICD-10-CM

## 2014-11-22 DIAGNOSIS — F519 Sleep disorder not due to a substance or known physiological condition, unspecified: Secondary | ICD-10-CM

## 2014-11-22 MED ORDER — ALPRAZOLAM 0.5 MG PO TABS: ORAL_TABLET | ORAL | Status: DC

## 2014-11-22 NOTE — Patient Instructions (Signed)
To prevent sleep dysfunction follow these instructions for sleep hygiene. Do not read, watch TV, or eat in bed. Do not get into bed until you are ready to turn off the light &  to go to sleep. Do not ingest stimulants ( decongestants, diet pills, nicotine, caffeine) after the evening meal.Do not take daytime naps.Cardiovascular exercise, this can be as simple a program as walking, is recommended 30 minutes 3-4 times per week. If you're not exercising you should take 6-8 weeks to build up to this level. Alprazolam is among those which experts have documented to have a very  high risk of affecting  mental  alertness  & balance. This results in increased risk of falling with serious health or life threatening injury. Such medication should be taken as infrequently as possible and @  the lowest possible dose.It should not be taken with alcohol, sedatives  or other agents which have a similar  adverse risk potential. These risks are greater as we age as there is decreased ability of the liver and kidneys to metabolize and excrete the medication, resulting in   increased blood levels of the active ingredient.

## 2014-11-22 NOTE — Progress Notes (Signed)
Pre visit review using our clinic review tool, if applicable. No additional management support is needed unless otherwise documented below in the visit note. 

## 2014-11-22 NOTE — Progress Notes (Signed)
   Subjective:    Patient ID: Alexandria Beck, female    DOB: [birth year], 81 y.o.   MRN: [id number]  HPI  She is here for refill of her alprazolam. She's been taking this each evening between 5-6 PM to help sleep. She does not actually go to sleep till 11:30 or 12 midnight.  She has used other supplemental natural alternatives. She's also taken Benadryl.  If anxious she will take a second dose during the day.  She states she is not taking generic trazodone.  Review of Systems She denies other acute issues at this time.  Her chemistries, electrolytes, renal function, liver function, and CBC and differential are up-to-date.  She has exhibited mild hyperglycemia but her A1c is been on nondiabetic range.    Objective:   Physical Exam  Pertinent or positive findings include: She appears much younger than her stated age Grade 1/6 systolic murmur  Gen.:  Adequately nourished; in no acute distress Eyes: Extraocular motion intact; no lid lag , proptosis , or nystagmus Neck: full ROM; no masses ; thyroid normal  Heart: Normal rhythm and rate without gallop, or extra heart sounds Lungs: Chest clear to auscultation without rales,rales, wheezes Neuro:Deep tendon reflexes are equal and within normal limits; no tremor  Skin/Nails: Warm and dry without significant lesions or rashes; no onycholysis Lymphatic: no cervical or axillary LA Psych: Normally communicative and interactive; no abnormal mood or affect clinically.         Assessment & Plan:  #1 chronic sleep disorder  Plan: The pathophysiology of sleep dysfunction discussed with her. Beer's findings  of increased risk of falls with hip fracture or subdural hematoma taking alprazolam was discussed frankly.  Given copy of Beers' precautions & recommendations .  I agreed to refill 30 pills but told her I cannot refill it in the future.  Sleep hygiene was discussed along with safer interventions for sleep.

## 2014-12-10 ENCOUNTER — Ambulatory Visit (INDEPENDENT_AMBULATORY_CARE_PROVIDER_SITE_OTHER): Payer: Commercial Managed Care - HMO | Admitting: Gastroenterology

## 2014-12-10 ENCOUNTER — Encounter: Payer: Self-pay | Admitting: Gastroenterology

## 2014-12-10 VITALS — BP 130/86 | HR 84 | Ht 60.5 in | Wt 167.5 lb

## 2014-12-10 DIAGNOSIS — K219 Gastro-esophageal reflux disease without esophagitis: Secondary | ICD-10-CM

## 2014-12-10 NOTE — Assessment & Plan Note (Signed)
Symptoms are well controlled on Carafate and Prilosec.  Breakthrough symptoms of pressure and belching are infrequent.  I'm not convinced that she necessarily needs both Carafate and omeprazole.  Recommendations #1 patient was instructed to take antacids as needed for belching and upper abdominal pressure #2 continue omeprazole #3 patient will attempt to stop Carafate

## 2014-12-10 NOTE — Patient Instructions (Signed)
Start using antiacids as needed Start using Mylanta as needed Discontinue carafate Follow up as needed Cc Gwyndolyn Saxon Hopper,MD

## 2014-12-10 NOTE — Progress Notes (Signed)
      History of Present Illness:  Alexandria Beck has returned for follow-up of dyspepsia.  She is generally feeling well.  She occasionally has spontaneous episodes of pressure in her upper abdomen that is followed by belching with relief.  It is unaffected by eating.  This may occur once every few days.  She remains on Prilosec and Carafate.    Review of Systems: Pertinent positive and negative review of systems were noted in the above HPI section. All other review of systems were otherwise negative.    Current Medications, Allergies, Past Medical History, Past Surgical History, Family History and Social History were reviewed in [facility] record  Vital signs were reviewed in today's medical record. Physical Exam: General: Well developed , well nourished, no acute distress   See Assessment and Plan under Problem List

## 2014-12-21 ENCOUNTER — Emergency Department (HOSPITAL_COMMUNITY): Payer: Commercial Managed Care - HMO

## 2014-12-21 ENCOUNTER — Observation Stay (HOSPITAL_COMMUNITY)
Admission: EM | Admit: 2014-12-21 | Discharge: 2014-12-22 | Disposition: A | Discharge status: Home / Self Care | Payer: Commercial Managed Care - HMO | Attending: Internal Medicine | Admitting: Internal Medicine

## 2014-12-21 ENCOUNTER — Encounter (HOSPITAL_COMMUNITY): Payer: Self-pay | Admitting: Family Medicine

## 2014-12-21 DIAGNOSIS — G8929 Other chronic pain: Secondary | ICD-10-CM | POA: Diagnosis not present

## 2014-12-21 DIAGNOSIS — R0789 Other chest pain: Principal | ICD-10-CM | POA: Insufficient documentation

## 2014-12-21 DIAGNOSIS — I5032 Chronic diastolic (congestive) heart failure: Secondary | ICD-10-CM | POA: Insufficient documentation

## 2014-12-21 DIAGNOSIS — R079 Chest pain, unspecified: Secondary | ICD-10-CM | POA: Diagnosis present

## 2014-12-21 DIAGNOSIS — F419 Anxiety disorder, unspecified: Secondary | ICD-10-CM | POA: Diagnosis not present

## 2014-12-21 DIAGNOSIS — K219 Gastro-esophageal reflux disease without esophagitis: Secondary | ICD-10-CM | POA: Diagnosis present

## 2014-12-21 DIAGNOSIS — F329 Major depressive disorder, single episode, unspecified: Secondary | ICD-10-CM | POA: Insufficient documentation

## 2014-12-21 DIAGNOSIS — I10 Essential (primary) hypertension: Secondary | ICD-10-CM | POA: Diagnosis not present

## 2014-12-21 DIAGNOSIS — M549 Dorsalgia, unspecified: Secondary | ICD-10-CM | POA: Diagnosis not present

## 2014-12-21 DIAGNOSIS — R0602 Shortness of breath: Secondary | ICD-10-CM | POA: Diagnosis not present

## 2014-12-21 DIAGNOSIS — F519 Sleep disorder not due to a substance or known physiological condition, unspecified: Secondary | ICD-10-CM

## 2014-12-21 LAB — URINALYSIS, ROUTINE W REFLEX MICROSCOPIC
Bilirubin Urine: NEGATIVE
GLUCOSE, UA: NEGATIVE mg/dL
Hgb urine dipstick: NEGATIVE
Ketones, ur: NEGATIVE mg/dL
LEUKOCYTES UA: NEGATIVE
NITRITE: NEGATIVE
PROTEIN: NEGATIVE mg/dL
Specific Gravity, Urine: 1.018 (ref 1.005–1.030)
UROBILINOGEN UA: 1 mg/dL (ref 0.0–1.0)
pH: 8 (ref 5.0–8.0)

## 2014-12-21 LAB — CBC WITH DIFFERENTIAL/PLATELET
Basophils Absolute: 0 10*3/uL (ref 0.0–0.1)
Basophils Relative: 0 % (ref 0–1)
Eosinophils Absolute: 0.1 10*3/uL (ref 0.0–0.7)
Eosinophils Relative: 2 % (ref 0–5)
HEMATOCRIT: 34.8 % — AB (ref 36.0–46.0)
Hemoglobin: 11.9 g/dL — ABNORMAL LOW (ref 12.0–15.0)
LYMPHS PCT: 43 % (ref 12–46)
Lymphs Abs: 2.3 10*3/uL (ref 0.7–4.0)
MCH: 30.6 pg (ref 26.0–34.0)
MCHC: 34.2 g/dL (ref 30.0–36.0)
MCV: 89.5 fL (ref 78.0–100.0)
MONO ABS: 0.3 10*3/uL (ref 0.1–1.0)
MONOS PCT: 5 % (ref 3–12)
Neutro Abs: 2.7 10*3/uL (ref 1.7–7.7)
Neutrophils Relative %: 50 % (ref 43–77)
Platelets: 303 10*3/uL (ref 150–400)
RBC: 3.89 MIL/uL (ref 3.87–5.11)
RDW: 12.5 % (ref 11.5–15.5)
WBC: 5.4 10*3/uL (ref 4.0–10.5)

## 2014-12-21 LAB — TROPONIN I
Troponin I: <0.03 ng/mL (ref <0.031)
Troponin I: <0.03 ng/mL (ref <0.031)

## 2014-12-21 LAB — BASIC METABOLIC PANEL
Anion gap: 8 (ref 5–15)
BUN: 9 mg/dL (ref 6–23)
CO2: 21 mmol/L (ref 19–32)
Calcium: 8.8 mg/dL (ref 8.4–10.5)
Chloride: 109 mmol/L (ref 96–112)
Creatinine, Ser: 0.81 mg/dL (ref 0.50–1.10)
GFR calc Af Amer: 77 mL/min — ABNORMAL LOW (ref >90)
GFR calc non Af Amer: 66 mL/min — ABNORMAL LOW (ref >90)
Glucose, Bld: 105 mg/dL — ABNORMAL HIGH (ref 70–99)
Potassium: 5.8 mmol/L — ABNORMAL HIGH (ref 3.5–5.1)
Sodium: 138 mmol/L (ref 135–145)

## 2014-12-21 LAB — POTASSIUM: Potassium: 3.9 mmol/L (ref 3.5–5.1)

## 2014-12-21 MED ORDER — CYCLOSPORINE 0.05 % OP EMUL
1.0000 [drp] | Freq: Two times a day (BID) | OPHTHALMIC | Status: DC
  Administered 2014-12-22: 1 [drp] via OPHTHALMIC
  Filled 2014-12-21 (×5): qty 1

## 2014-12-21 MED ORDER — ASPIRIN EC 81 MG PO TBEC
81.0000 mg | DELAYED_RELEASE_TABLET | Freq: Every day | ORAL | Status: DC
  Administered 2014-12-22: 81 mg via ORAL
  Filled 2014-12-21 (×2): qty 1

## 2014-12-21 MED ORDER — GI COCKTAIL ~~LOC~~
30.0000 mL | Freq: Four times a day (QID) | ORAL | Status: DC | PRN
  Administered 2014-12-21: 30 mL via ORAL
  Filled 2014-12-21 (×2): qty 30

## 2014-12-21 MED ORDER — IRBESARTAN 75 MG PO TABS
75.0000 mg | ORAL_TABLET | Freq: Every day | ORAL | Status: DC
  Administered 2014-12-21 – 2014-12-22 (×2): 75 mg via ORAL
  Filled 2014-12-21 (×4): qty 1

## 2014-12-21 MED ORDER — PANTOPRAZOLE SODIUM 40 MG PO TBEC
40.0000 mg | DELAYED_RELEASE_TABLET | Freq: Every day | ORAL | Status: DC
  Administered 2014-12-21 – 2014-12-22 (×2): 40 mg via ORAL
  Filled 2014-12-21 (×2): qty 1

## 2014-12-21 MED ORDER — ACETAMINOPHEN 325 MG PO TABS: 650.0000 mg | ORAL_TABLET | ORAL | Status: DC | PRN

## 2014-12-21 MED ORDER — ONDANSETRON HCL 4 MG/2ML IJ SOLN: 4.0000 mg | Freq: Four times a day (QID) | INTRAMUSCULAR | Status: DC | PRN

## 2014-12-21 MED ORDER — HEPARIN SODIUM (PORCINE) 5000 UNIT/ML IJ SOLN
5000.0000 [IU] | Freq: Three times a day (TID) | INTRAMUSCULAR | Status: DC
  Administered 2014-12-21 – 2014-12-22 (×2): 5000 [IU] via SUBCUTANEOUS
  Filled 2014-12-21 (×5): qty 1

## 2014-12-21 MED ORDER — METOPROLOL TARTRATE 25 MG PO TABS
37.5000 mg | ORAL_TABLET | Freq: Two times a day (BID) | ORAL | Status: DC
  Administered 2014-12-21 – 2014-12-22 (×3): 37.5 mg via ORAL
  Filled 2014-12-21: qty 1
  Filled 2014-12-21: qty 2
  Filled 2014-12-21 (×2): qty 1

## 2014-12-21 MED ORDER — VITAMIN C 500 MG PO TABS
500.0000 mg | ORAL_TABLET | Freq: Every day | ORAL | Status: DC
  Administered 2014-12-21 – 2014-12-22 (×2): 500 mg via ORAL
  Filled 2014-12-21 (×4): qty 1

## 2014-12-21 MED ORDER — TRAZODONE HCL 100 MG PO TABS
100.0000 mg | ORAL_TABLET | Freq: Every day | ORAL | Status: DC
  Administered 2014-12-21: 100 mg via ORAL
  Filled 2014-12-21 (×2): qty 1

## 2014-12-21 NOTE — ED Provider Notes (Signed)
CSN: [id number]     Arrival date & time 12/21/14  0909 History   First MD Initiated Contact with Patient 12/21/14 [phone]     Chief Complaint  Patient presents with  . Chest Pain     (Consider location/radiation/quality/duration/timing/severity/associated sxs/prior Treatment) HPI Comments: Pt comes in with c/o burning sensation in the epigastric area and shoulder and neck last night. Pt states that the symptoms lasted from for about 2 hours last night and then started again at 4 am and the symptoms have resolved at this time. She states that she took "something like asa" and it resolved the symptoms. No n/v/d, cough, fever, sob, diaphoresis.  She states that at the beginning of the year she had some heart test and everything looked fine. Denies swelling. State that she is being treated for gerd but this seemed different in that it was in her shoulder and neck as well. Also complaining or urinary pressure  The history is provided by the patient. No language interpreter was used.    Past Medical History  Diagnosis Date  . Near syncope     diagnosed as anxiety related. gets some relief with clonazepam. No formal testing done.  [name] GERD (gastroesophageal reflux disease)     has had EGD 2006-negative  . Allergy     seasonal  . Irregular heart beat     no testing, holter, etc. Takes no medication  . Hypertension      On no medication. Home readings SBP=150's  . Diverticulosis     2004  . Internal hemorrhoids   . Esophageal stricture    Past Surgical History  Procedure Laterality Date  . Abdominal hysterectomy  1974    fibroid tumors w/ metorrhagia  . Tonsillectomy    . G4p4 - nsvd  midwife delivery for two    . Tonsillectomy     Family History  Problem Relation Age of Onset  . Heart disease Mother   . Stroke Mother   . Hypertension Mother   . Cancer Father     lung cancer - smoker  . Heart disease Sister     MI  . Kidney disease Sister     end stage renal disease on HD  .  Diabetes Sister   . Heart disease Brother   . Diabetes Sister   . Hypertension Sister   . Kidney disease Sister   . Cancer Sister   . Diabetes Sister   . Kidney disease Sister     HD  . Hypertension Sister   . Heart disease Sister   . Hypertension Sister   . Heart disease Sister   . Hypertension Sister   . Hypertension Sister    History  Substance Use Topics  . Smoking status: Never Smoker   . Smokeless tobacco: Never Used  . Alcohol Use: No   OB History    No data available     Review of Systems  All other systems reviewed and are negative.     Allergies  Clonazepam; Codeine; and Aspirin  Home Medications   Prior to Admission medications   Medication Sig Start Date End Date Taking? Authorizing Provider  ARTIFICIAL TEAR OP Place 1 drop into both eyes 2 (two) times daily.   Yes Historical Provider, MD  metoprolol tartrate (LOPRESSOR) 25 MG tablet Take 1.5 tablets (37.5 mg total) by mouth 2 (two) times daily. 05/20/14  Yes Pixie Casino, MD  Omega-3 Fatty Acids (OMEGA ESSENTIALS BASIC PO) Take 3 capsules by mouth  3 (three) times daily. Omega XL   Yes Historical Provider, MD  omeprazole (PRILOSEC) 40 MG capsule Take 1 tab twice daily 10/15/14  Yes Amy S Esterwood, PA-C  OVER THE COUNTER MEDICATION Apply 1 application topically daily. "Two Old Goats - Moisturizing lotion"   Yes Historical Provider, MD  RESTASIS 0.05 % ophthalmic emulsion Apply 1 Dose to eye 2 (two) times daily. 12/20/14  Yes Historical Provider, MD  traZODone (DESYREL) 100 MG tablet Take 1 tablet (100 mg total) by mouth at bedtime. 07/13/14  Yes Janith Lima, MD  valsartan (DIOVAN) 80 MG tablet Take 1 tablet (80 mg total) by mouth daily. 07/29/14  Yes Pixie Casino, MD  vitamin C (ASCORBIC ACID) 500 MG tablet Take 500 mg by mouth daily.   Yes Historical Provider, MD  ALPRAZolam Duanne Moron) 0.5 MG tablet 1 qd prn , not routinely du to risk as discussed Patient not taking: Reported on 12/21/2014 11/22/14    Hendricks Limes, MD   BP 154/69 mmHg  Pulse 72  Temp(Src) 98.5 F (36.9 C) (Oral)  Resp 20  SpO2 100% Physical Exam  Constitutional: She is oriented to person, place, and time. She appears well-developed and well-nourished.  HENT:  Head: Normocephalic and atraumatic.  Eyes: Conjunctivae and EOM are normal. Pupils are equal, round, and reactive to light.  Cardiovascular: Normal rate and regular rhythm.   Pulmonary/Chest: Effort normal and breath sounds normal.  Abdominal: Soft. Bowel sounds are normal. There is tenderness in the epigastric area.  Musculoskeletal: Normal range of motion.  Neurological: She is alert and oriented to person, place, and time.  Skin: Skin is warm and dry.  Psychiatric: She has a normal mood and affect.  Nursing note and vitals reviewed.   ED Course  Procedures (including critical care time) Labs Review Labs Reviewed  BASIC METABOLIC PANEL - Abnormal; Notable for the following:    Potassium 5.8 (*)    Glucose, Bld 105 (*)    GFR calc non Af Amer 66 (*)    GFR calc Af Amer 77 (*)    All other components within normal limits  CBC WITH DIFFERENTIAL/PLATELET - Abnormal; Notable for the following:    Hemoglobin 11.9 (*)    HCT 34.8 (*)    All other components within normal limits  TROPONIN I  URINALYSIS, ROUTINE W REFLEX MICROSCOPIC  POTASSIUM  TROPONIN I    Imaging Review Dg Chest 2 View  12/21/2014   CLINICAL DATA:  Chest pain and shortness of breath today  EXAM: CHEST  2 VIEW  COMPARISON:  07/08/2014  FINDINGS: The heart size and mediastinal contours are within normal limits. Both lungs are clear. The visualized skeletal structures are unremarkable. A lead is noted over the lateral aspect of the right upper lobe.  IMPRESSION: No active cardiopulmonary disease.   Electronically Signed   By: Inez Catalina M.D.   On: 12/21/2014 12:01     EKG Interpretation   Date/Time:  Tuesday December 21 2014 09:16:13 EST Ventricular Rate:  79 PR Interval:   177 QRS Duration: 69 QT Interval:  392 QTC Calculation: 449 R Axis:   70 Text Interpretation:  Sinus rhythm Borderline T wave abnormalities  Baseline wander in lead(s) I Sinus rhythm Artifact T wave abnormality  Abnormal ekg Confirmed by Carmin Muskrat  MD ([id number]) on 12/21/2014 9:44:54  AM      MDM   Final diagnoses:  Chest pain    Pt is to admitted for cp rule out. hospitalist  accepted.    Glendell Docker, NP 12/21/14 [place], MD 12/21/14 [phone]

## 2014-12-21 NOTE — ED Notes (Signed)
Contacted lab to add-on Troponin from 1220 collection.  Tamala Julian NP aware.

## 2014-12-21 NOTE — H&P (Signed)
[facility] History and Physical                                                                                    Alexandria Beck, is a 81 y.o. female  MRN: [id number]   DOB - [birth year]  Admit Date - 12/21/2014  Outpatient Primary MD for the patient is Alexandria Cobble, MD  With History of -  Past Medical History  Diagnosis Date  . Near syncope     diagnosed as anxiety related. gets some relief with clonazepam. No formal testing done.  [name] GERD (gastroesophageal reflux disease)     has had EGD 2006-negative  . Allergy     seasonal  . Irregular heart beat     no testing, holter, etc. Takes no medication  . Hypertension      On no medication. Home readings SBP=150's  . Diverticulosis     2004  . Internal hemorrhoids   . Esophageal stricture       Past Surgical History  Procedure Laterality Date  . Abdominal hysterectomy  1974    fibroid tumors w/ metorrhagia  . Tonsillectomy    . G4p4 - nsvd  midwife delivery for two    . Tonsillectomy      in for   Chief Complaint  Patient presents with  . Chest Pain     HPI Alexandria Beck  is a 81 y.o. female, with history of GERD and significant dyspepsia followed as an outpatient by [facility], also with history of prior syncope and palpitations followed by [facility] cardiology as an outpatient. Palpitations have been documented as improving on beta blocker therapy. She also has a history of hypertension. She presented to the ER with complaints of burning sensation in the epigastric area that radiates to the shoulders and neck the night before. She forces symptoms lasted 2 hours and reoccurred again at 4 AM and had resolved by the time she presented to the ER. She took an aspirin-type medication to help resolve her symptoms. No nausea vomiting or other associated symptoms with these symptoms/discomfort.  In the ER her EKG was without any evidence ischemia. She is undergone 2 serial troponins that are normal. Chest x-ray was unremarkable.  In talking with the patient she describes the discomfort she had been having as a left anterior pressure but sometimes awakens her at night. She'll feel the pressure initially and then discover she is having palpitations. The symptoms typically lasts between one to 2 hours. They have no associated symptoms such as diaphoresis she did clarify mild shortness of breath but no nausea. She reports is above the symptoms occasionally radiate to her back and her shoulders. In regards to her palpitations she reports that she does not take the beta blocker as scheduled and instead takes it as needed when she has awareness of tachycardia palpitations. She states she is typically able to walk about 14 blocks and does not have any exertional symptoms such as chest pain, shortness of breath or nausea.  In review of the medical record patient underwent abdominal ultrasound in December 2015 which was negative for any gallstones or other abnormalities. She states she had a  stress test several years ago. She also reports that recently she's had more sore throat that she relates to reflux and increased burping. She was last evaluated by gastroenterology on 2/12 recommendations to continue omeprazole but attempt to stop her Carafate. He was last evaluated by Dr. Debara Pickett on 07/29/14 with recommendations to continue her beta blocker. At that time she was taking Xanax twice a day but patient has subsequent stop that because she does not like taking this type of medications at her advanced age. Her blood pressure was not at goal and it was recommended to start Diovan daily.  Review of Systems   In addition to the HPI above,  No Fever-chills, myalgias or other constitutional symptoms No Headache, changes with Vision or hearing, new weakness, tingling, numbness in any extremity, No Abdominal pain, N/V; no melena or hematochezia, no dark tarry stools, Bowel movements are regular, No dysuria, hematuria or flank pain No new skin rashes,  lesions, masses or bruises, No recent weight gain or loss No polyuria, polydypsia or polyphagia,  *A full 10 point Review of Systems was done, except as stated above, all other Review of Systems were negative.  Social History History  Substance Use Topics  . Smoking status: Never Smoker   . Smokeless tobacco: Never Used  . Alcohol Use: No    Family History Family History  Problem Relation Age of Onset  . Heart disease Mother   . Stroke Mother   . Hypertension Mother   . Cancer Father     lung cancer - smoker  . Heart disease Sister     MI  . Kidney disease Sister     end stage renal disease on HD  . Diabetes Sister   . Heart disease Brother   . Diabetes Sister   . Hypertension Sister   . Kidney disease Sister   . Cancer Sister   . Diabetes Sister   . Kidney disease Sister     HD  . Hypertension Sister   . Heart disease Sister   . Hypertension Sister   . Heart disease Sister   . Hypertension Sister   . Hypertension Sister     Prior to Admission medications   Medication Sig Start Date End Date Taking? Authorizing Provider  ARTIFICIAL TEAR OP Place 1 drop into both eyes 2 (two) times daily.   Yes Historical Provider, MD  metoprolol tartrate (LOPRESSOR) 25 MG tablet Take 1.5 tablets (37.5 mg total) by mouth 2 (two) times daily. 05/20/14  Yes Pixie Casino, MD  Omega-3 Fatty Acids (OMEGA ESSENTIALS BASIC PO) Take 3 capsules by mouth 3 (three) times daily. Omega XL   Yes Historical Provider, MD  omeprazole (PRILOSEC) 40 MG capsule Take 1 tab twice daily 10/15/14  Yes Amy S Esterwood, PA-C  OVER THE COUNTER MEDICATION Apply 1 application topically daily. "Two Old Goats - Moisturizing lotion"   Yes Historical Provider, MD  RESTASIS 0.05 % ophthalmic emulsion Apply 1 Dose to eye 2 (two) times daily. 12/20/14  Yes Historical Provider, MD  traZODone (DESYREL) 100 MG tablet Take 1 tablet (100 mg total) by mouth at bedtime. 07/13/14  Yes Janith Lima, MD  valsartan (DIOVAN)  80 MG tablet Take 1 tablet (80 mg total) by mouth daily. 07/29/14  Yes Pixie Casino, MD  vitamin C (ASCORBIC ACID) 500 MG tablet Take 500 mg by mouth daily.   Yes Historical Provider, MD  ALPRAZolam Duanne Moron) 0.5 MG tablet 1 qd prn , not routinely du to  risk as discussed Patient not taking: Reported on 12/21/2014 11/22/14   Hendricks Limes, MD    Allergies  Allergen Reactions  . Clonazepam Swelling and Other (See Comments)    Tongue thickening sensation  . Codeine Rash  . Aspirin Nausea And Vomiting and Other (See Comments)    Stomach upset    Physical Exam  Vitals  Blood pressure 139/72, pulse 75, temperature 98.1 F (36.7 C), temperature source Oral, resp. rate 18, SpO2 97 %.   General:  In no acute distress, appears healthy and well nourished  Psych:  Normal affect, Denies Suicidal or Homicidal ideations, Awake Alert, Oriented X 3. Speech and thought patterns are clear and appropriate, no apparent short term memory deficits  Neuro:   No focal neurological deficits, CN II through XII intact, Strength 5/5 all 4 extremities, Sensation intact all 4 extremities.  ENT:  Ears and Eyes appear Normal, Conjunctivae clear, PER. Moist oral mucosa without erythema or exudates.  Neck:  Supple, No lymphadenopathy appreciated  Respiratory:  Symmetrical chest wall movement, Good air movement bilaterally, CTAB. Room Air  Cardiac:  RRR, No Murmurs, no LE edema noted, no JVD, No carotid bruits, peripheral pulses palpable at 2+  Abdomen:  Positive bowel sounds, Soft, Non tender, Non distended,  No masses appreciated, no obvious hepatosplenomegaly  Skin:  No Cyanosis, Normal Skin Turgor, No Skin Rash or Bruise.  Extremities: Symmetrical without obvious trauma or injury,  no effusions.  Data Review  CBC  Recent Labs Lab 12/21/14 0926  WBC 5.4  HGB 11.9*  HCT 34.8*  PLT 303  MCV 89.5  MCH 30.6  MCHC 34.2  RDW 12.5  LYMPHSABS 2.3  MONOABS 0.3  EOSABS 0.1  BASOSABS 0.0     Chemistries   Recent Labs Lab 12/21/14 0926 12/21/14 1220  NA 138  --   K 5.8* 3.9  CL 109  --   CO2 21  --   GLUCOSE 105*  --   BUN 9  --   CREATININE 0.81  --   CALCIUM 8.8  --     estimated creatinine clearance is 50.2 mL/min (by C-G formula based on Cr of 0.81).  No results for input(s): TSH, T4TOTAL, T3FREE, THYROIDAB in the last 72 hours.  Invalid input(s): FREET3  Coagulation profile No results for input(s): INR, PROTIME in the last 168 hours.  No results for input(s): DDIMER in the last 72 hours.  Cardiac Enzymes  Recent Labs Lab 12/21/14 0926 12/21/14 1241  TROPONINI <0.03 <0.03    Invalid input(s): POCBNP  Urinalysis    Component Value Date/Time   COLORURINE YELLOW 12/21/2014 1309   APPEARANCEUR CLEAR 12/21/2014 1309   LABSPEC 1.018 12/21/2014 1309   PHURINE 8.0 12/21/2014 1309   GLUCOSEU NEGATIVE 12/21/2014 1309   HGBUR NEGATIVE 12/21/2014 1309   BILIRUBINUR NEGATIVE 12/21/2014 1309   KETONESUR NEGATIVE 12/21/2014 1309   PROTEINUR NEGATIVE 12/21/2014 1309   UROBILINOGEN 1.0 12/21/2014 1309   NITRITE NEGATIVE 12/21/2014 1309   LEUKOCYTESUR NEGATIVE 12/21/2014 1309    Imaging results:   Dg Chest 2 View  12/21/2014   CLINICAL DATA:  Chest pain and shortness of breath today  EXAM: CHEST  2 VIEW  COMPARISON:  07/08/2014  FINDINGS: The heart size and mediastinal contours are within normal limits. Both lungs are clear. The visualized skeletal structures are unremarkable. A lead is noted over the lateral aspect of the right upper lobe.  IMPRESSION: No active cardiopulmonary disease.   Electronically Signed   By:  Inez Catalina M.D.   On: 12/21/2014 12:01     EKG: On this rhythm without any acute ischemic changes   Assessment & Plan  Principal Problem:   Chest pain -Admit to telemetry -Heart score is 4 -Risk factors are advanced age, hypertension, and positive family medical history-mother with history of initial MI in her 40s -Continue  preadmission medications which include beta blocker -Add aspirin, we'll utilize low dose since has severe indigestion -Check 2-D echocardiogram; last echocardiogram was August 2015 and showed grade 1 diastolic dysfunction with moderate LVH with aortic sclerosis-normal LV and RV systolic function -Cycle cardiac enzymes  Active Problems:   GERD  -Continue Protonix -If symptoms deemed not related to cardiac causes may need to resume Carafate previously discontinued by GI -5 when necessary GI cocktail    Hypertension -Continue ARB and beta blocker    Anxiety -Continue Desyrel -Patient prefers not to utilize medications such as benzodiazepines if possible    Chronic back pain -When necessary pain medications as indicated    DVT Prophylaxis: Subcutaneous heparin  Family Communication:   No family at bedside  Code Status:  Full code  Condition:  Stable  Time spent in minutes : 60   ELLIS,ALLISON L. ANP on 12/21/2014 at 2:35 PM  Between 7am to 7pm - Pager - [phone]  After 7pm go to [email or web address] - password [email or web address]  And look for the night coverage person covering me after hours  [facility]

## 2014-12-21 NOTE — ED Notes (Signed)
Patient tried to give a urine specimen but was unable.

## 2014-12-21 NOTE — ED Notes (Signed)
Pt presents via POV with c/o CP.  Pt reports burning sensation from epigastric area to chest, bilateral shoulders, and neck that lasted from 2230-0400 last night.  Pt reports history of GERD but reports this pain was different.  Pt is A&Ox4 and in NAD. Pt denies pain at this time. Tamala Julian NP at bedside.

## 2014-12-22 ENCOUNTER — Encounter (HOSPITAL_COMMUNITY): Payer: Self-pay

## 2014-12-22 DIAGNOSIS — F329 Major depressive disorder, single episode, unspecified: Secondary | ICD-10-CM | POA: Diagnosis not present

## 2014-12-22 DIAGNOSIS — K219 Gastro-esophageal reflux disease without esophagitis: Secondary | ICD-10-CM | POA: Diagnosis not present

## 2014-12-22 DIAGNOSIS — R079 Chest pain, unspecified: Secondary | ICD-10-CM | POA: Diagnosis not present

## 2014-12-22 DIAGNOSIS — I10 Essential (primary) hypertension: Secondary | ICD-10-CM | POA: Diagnosis not present

## 2014-12-22 DIAGNOSIS — M549 Dorsalgia, unspecified: Secondary | ICD-10-CM | POA: Diagnosis not present

## 2014-12-22 DIAGNOSIS — R0789 Other chest pain: Secondary | ICD-10-CM | POA: Diagnosis not present

## 2014-12-22 DIAGNOSIS — I5032 Chronic diastolic (congestive) heart failure: Secondary | ICD-10-CM | POA: Diagnosis not present

## 2014-12-22 DIAGNOSIS — G8929 Other chronic pain: Secondary | ICD-10-CM | POA: Diagnosis not present

## 2014-12-22 DIAGNOSIS — F419 Anxiety disorder, unspecified: Secondary | ICD-10-CM | POA: Diagnosis not present

## 2014-12-22 LAB — TROPONIN I: Troponin I: <0.03 ng/mL (ref <0.031)

## 2014-12-22 MED ORDER — OMEPRAZOLE 40 MG PO CPDR: 40.0000 mg | DELAYED_RELEASE_CAPSULE | Freq: Two times a day (BID) | ORAL | Status: DC

## 2014-12-22 MED ORDER — ALPRAZOLAM 0.5 MG PO TABS: 0.5000 mg | ORAL_TABLET | Freq: Every day | ORAL | Status: DC | PRN

## 2014-12-22 MED ORDER — SUCRALFATE 1 G PO TABS: 1.0000 g | ORAL_TABLET | Freq: Three times a day (TID) | ORAL | Status: DC

## 2014-12-22 NOTE — Progress Notes (Signed)
UR completed 

## 2014-12-22 NOTE — Progress Notes (Signed)
  Echocardiogram 2D Echocardiogram has been performed.  Alexandria Beck M 12/22/2014, 2:14 PM

## 2014-12-22 NOTE — Discharge Summary (Signed)
Physician Discharge Summary  Alexandria Beck DXI:[id number] DOB: [birth year] DOA: 12/21/2014  PCP: Unice Cobble, MD  Admit date: 12/21/2014 Discharge date: 12/22/2014  Time spent: >30 minutes  Recommendations for Outpatient Follow-up:  Reassess blood pressure and adjust medication as needed Check basic metabolic panel to assess renal function and electrolytes Patient will benefit of referral to a psychiatrist or geriatric service for further prescription of medications to help controlling her mood disorder (she has underlying anxiety/depression that has just been partially treated with the use of Remeron. At discharge she received a small amount of sinus to use as needed no more than once a day for uncontrolled anxiety)  Discharge Diagnoses:  Principal Problem:   Chest pain Active Problems:   GERD (gastroesophageal reflux disease)   Hypertension   Anxiety   Chronic back pain   Discharge Condition: Stable and improved. Has been discharged home with instructions to arrange follow-up with PCP in 10 days  Diet recommendation: Heart healthy diet  Filed Weights   12/21/14 1853  Weight: 75.1 kg (165 lb 9.1 oz)    History of present illness:  81 y.o. female, with history of GERD and significant dyspepsia followed as an outpatient by [facility], also with history of prior syncope and palpitations followed by [facility] cardiology as an outpatient. Palpitations have been documented as improving on beta blocker therapy. She also has a history of hypertension. She presented to the ER with complaints of burning sensation in the epigastric area that radiates to the shoulders and neck the night before. She forces symptoms lasted 2 hours and reoccurred again at 4 AM and had resolved by the time she presented to the ER. She took an aspirin-type medication to help resolve her symptoms. No nausea vomiting or other associated symptoms with these symptoms/discomfort. Patient was admitted for ACS rule out and  further evaluation/treatment  Hospital Course:  1-chest pain (noncardiac) -Appears to be secondary to gastroesophageal reflux disease versus anxiety. -Patient with no wall motion abnormalities on 2-D echo, negative troponin and no acute ischemic changes on EKG or telemetry -She was treated successfully with the use of Carafate and PPI's -Will continue the use of beta blockers and Omega 3  2-essential hypertension: Stable and well controlled. We will continue the use of Diovan and Lopressor  3-diastolic heart failure (chronic): Grade 1 as seen on 2-D echo. -Patient euvolemic and without any signs of fluid overload. -Has been advised to follow a low-sodium diet and to check her weight on daily basis  4-gastroesophageal reflux disease: Patient will continue the use of omeprazole but she will be now on 40 mg twice a day and will also add the use of Carafate 3 times a day.  5-anxiety/depression: Continue trazodone and will also use one Saturday as needed Xanax.  6-chronic back pain: Continue as needed pain medications as previous taking prior to admission.    Procedures: 2-D echo: 12/22/2014 - Left ventricle: The cavity size was normal. Wall thickness was increased in a pattern of mild LVH. There was focal basal hypertrophy. Systolic function was vigorous. The estimated ejection fraction was in the range of 65% to 70%. Wall motion was normal; there were no regional wall motion abnormalities. Doppler parameters are consistent with abnormal left ventricular relaxation (grade 1 diastolic dysfunction). - Aortic valve: Mildly calcified annulus. - Mitral valve: Mildly calcified annulus. There was mild regurgitation.  Consultations:  None  Discharge Exam: Filed Vitals:   12/22/14 1300  BP: 129/50  Pulse: 63  Temp:  Resp: 20    General: Afebrile, denies chest pain or shortness of breath. Still with intermittent episodes of chronic back discomfort. Cardiovascular: S1  and S2, positive soft systolic murmur, no rubs or gallops Respiratory: Good air movement, no wheezing, no crackles Abdomen: Positive bowel sounds, no distention, no tenderness on palpation; no organomegaly or guarding.  Discharge Instructions   Discharge Instructions    Diet - low sodium heart healthy    Complete by:  As directed      Discharge instructions    Complete by:  As directed   Arrange hospital follow-up visit with primary care physician in approximately 10 days Take medications as prescribed Follow a heart healthy diet Maintain adequate hydration          Current Discharge Medication List    START taking these medications   Details  sucralfate (CARAFATE) 1 G tablet Take 1 tablet (1 g total) by mouth 3 (three) times daily. Qty: 90 tablet, Refills: 0      CONTINUE these medications which have CHANGED   Details  ALPRAZolam (XANAX) 0.5 MG tablet Take 1 tablet (0.5 mg total) by mouth daily as needed for anxiety. 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 Qty: 60 capsule, Refills: 2      CONTINUE these medications which have NOT CHANGED   Details  ARTIFICIAL TEAR OP Place 1 drop into both eyes 2 (two) times daily.    metoprolol tartrate (LOPRESSOR) 25 MG tablet Take 1.5 tablets (37.5 mg total) by mouth 2 (two) times daily. Qty: 90 tablet, Refills: 6    Omega-3 Fatty Acids (OMEGA ESSENTIALS BASIC PO) Take 3 capsules by mouth 3 (three) times daily. Omega XL    OVER THE COUNTER MEDICATION Apply 1 application topically daily. "Two Old Goats - Moisturizing lotion"    RESTASIS 0.05 % ophthalmic emulsion Apply 1 Dose to eye 2 (two) times daily.    traZODone (DESYREL) 100 MG tablet Take 1 tablet (100 mg total) by mouth at bedtime. Qty: 90 tablet, Refills: 3   Associated Diagnoses: Sleep  disorder not due to substance or known physiological condition; Anxiety    valsartan (DIOVAN) 80 MG tablet Take 1 tablet (80 mg total) by mouth daily. Qty: 30 tablet, Refills: 6    vitamin C (ASCORBIC ACID) 500 MG tablet Take 500 mg by mouth daily.       Allergies  Allergen Reactions  . Clonazepam Swelling and Other (See Comments)    Tongue thickening sensation  . Codeine Rash  . Aspirin Nausea And Vomiting and Other (See Comments)    Stomach upset   Follow-up Information    Follow up with Unice Cobble, MD. Schedule an appointment as soon as possible for a visit in 10 days.   Specialty:  Internal Medicine   Contact information:   [address] [address] [phone]       The results of significant diagnostics from this hospitalization (including imaging, microbiology, ancillary and laboratory) are listed below for reference.    Significant Diagnostic Studies: Dg Chest 2 View  12/21/2014   CLINICAL DATA:  Chest pain and shortness of breath today  EXAM: CHEST  2 VIEW  COMPARISON:  07/08/2014  FINDINGS: The heart size and mediastinal contours are within normal limits. Both lungs are clear. The visualized  skeletal structures are unremarkable. A lead is noted over the lateral aspect of the right upper lobe.  IMPRESSION: No active cardiopulmonary disease.   Electronically Signed   By: Inez Catalina M.D.   On: 12/21/2014 12:01    Labs: Basic Metabolic Panel:  Recent Labs Lab 12/21/14 0926 12/21/14 1220  NA 138  --   K 5.8* 3.9  CL 109  --   CO2 21  --   GLUCOSE 105*  --   BUN 9  --   CREATININE 0.81  --   CALCIUM 8.8  --    CBC:  Recent Labs Lab 12/21/14 0926  WBC 5.4  NEUTROABS 2.7  HGB 11.9*  HCT 34.8*  MCV 89.5  PLT 303   Cardiac Enzymes:  Recent Labs Lab 12/21/14 0925 12/21/14 0926 12/21/14 1241 12/22/14 0129 12/22/14 0740  TROPONINI <0.03 <0.03 <0.03 <0.03 <0.03     Signed:  Barton Dubois  [facility] 12/22/2014, 6:03  PM

## 2014-12-23 ENCOUNTER — Telehealth: Payer: Self-pay | Admitting: *Deleted

## 2014-12-23 NOTE — Telephone Encounter (Signed)
Transition Care Management Follow-up Telephone Call D/C from 12/22/14   How have you been since you were released from the hospital? Pt states she is doing ok   Do you understand why you were in the hospital? YES   Do you understand the discharge instrcutions? YES  Items Reviewed:  Medications reviewed: YES  Allergies reviewed: YES  Dietary changes reviewed: YES  Referrals reviewed: No referral needed   Functional Questionnaire:   Activities of Daily Living (ADLs):   She states she are independent in the following: ambulation, bathing and hygiene, feeding, continence, grooming, toileting and dressing States she doesn't require assistance    Any transportation issues/concerns?: NO   Any patient concerns? YES, pt states she don't understand why md will not fill her anxiety medication. She states she don't take it everyday but she have anxiety issues. Inform pt at the appt they can discuss , and maybe he can rx alternative med for her   Confirmed importance and date/time of follow-up visits scheduled: YES   Confirmed with patient if condition begins to worsen call PCP or go to the ER.

## 2015-01-03 ENCOUNTER — Other Ambulatory Visit (INDEPENDENT_AMBULATORY_CARE_PROVIDER_SITE_OTHER): Payer: Commercial Managed Care - HMO

## 2015-01-03 ENCOUNTER — Encounter: Payer: Self-pay | Admitting: Internal Medicine

## 2015-01-03 ENCOUNTER — Ambulatory Visit (INDEPENDENT_AMBULATORY_CARE_PROVIDER_SITE_OTHER): Payer: Commercial Managed Care - HMO | Admitting: Internal Medicine

## 2015-01-03 VITALS — BP 136/88 | HR 65 | Temp 98.8°F | Ht 61.0 in | Wt 166.5 lb

## 2015-01-03 DIAGNOSIS — F4322 Adjustment disorder with anxiety: Secondary | ICD-10-CM

## 2015-01-03 DIAGNOSIS — I10 Essential (primary) hypertension: Secondary | ICD-10-CM

## 2015-01-03 DIAGNOSIS — K2100 Gastro-esophageal reflux disease with esophagitis, without bleeding: Secondary | ICD-10-CM

## 2015-01-03 LAB — BASIC METABOLIC PANEL
BUN: 14 mg/dL (ref 6–23)
CO2: 26 mEq/L (ref 19–32)
Calcium: 9.5 mg/dL (ref 8.4–10.5)
Chloride: 104 mEq/L (ref 96–112)
Creatinine, Ser: 0.84 mg/dL (ref 0.40–1.20)
GFR: 83.58 mL/min (ref >60.00)
GLUCOSE: 101 mg/dL — AB (ref 70–99)
POTASSIUM: 3.6 meq/L (ref 3.5–5.1)
SODIUM: 137 meq/L (ref 135–145)

## 2015-01-03 LAB — TSH: TSH: 0.64 u[IU]/mL (ref 0.35–4.50)

## 2015-01-03 NOTE — Progress Notes (Signed)
Pre visit review using our clinic review tool, if applicable. No additional management support is needed unless otherwise documented below in the visit note. 

## 2015-01-03 NOTE — Patient Instructions (Addendum)
Reflux of gastric acid may be asymptomatic as this may occur mainly during sleep.The triggers for reflux  include stress; the "aspirin family" ; alcohol; peppermint; and caffeine (coffee, tea, cola, and chocolate). The aspirin family would include aspirin and the nonsteroidal agents such as ibuprofen &  Naproxen. Tylenol would not cause reflux. If having symptoms ; food & drink should be avoided for @ least 2 hours before going to bed.    Your next office appointment will be determined based upon review of your pending labs  Those instructions will be transmitted to you by mail. Critical values will be called. Followup as needed for any active or acute issue. Please report any significant change in your symptoms. 

## 2015-01-03 NOTE — Progress Notes (Signed)
   Subjective:    Patient ID: Alexandria Beck, female    DOB: [birth year], 81 y.o.   MRN: [id number]  HPI She is here for post hospital follow up. The hospital records were reviewed; she was hospitalized 2/23-24/16 with epigastric discomfort with neck and shoulder radiation. She had taken aspirin to treat this. Workup for cardiac etiology was negative. She does have a history of grade 1 diastolic chronic heart failure. Symptoms responded to Carafate plus PPI twice a day.  Significant issues include anxiety & depression. Referral to psychiatry or geriatric medicine was recommended. She was to continue on trazodone. Remeron daily was administered but not prescribed at discharge. She was given a limited Rx of the limited Xanax to be taken as needed.  Follow-up of blood pressure and her BMET was recommended. At home  BP 130/70 on average; high 140/74.  Her last TSH on record was 1.34 on 10/13/13.  Review of Systems Unexplained weight loss,  significant dyspepsia, dysphagia, melena, rectal bleeding, or persistently small caliber stools are denied.  Palpitations, tachycardia, exertional dyspnea, paroxysmal nocturnal dyspnea, claudication or edema are absent.  She denies any constellation of headache, chest pain, flushing, tachycardia, or diarrhea.       Objective:   Physical Exam Pertinent or positive findings include: She is very animated and and interactive. There is no clinical depression or anxiety present.  She has complete dentures.  There is a grade 7/8-2 systolic murmur at the base.  She has marked crepitus of the knees with fusiform changes. Pedal pulses are decreased  General appearance :adequately nourished; in no distress.Appears younger than stated age Eyes: No conjunctival inflammation or scleral icterus is present. Oral exam:  Lips and gums are healthy appearing.There is no oropharyngeal erythema or exudate noted.  Heart:  Normal rate and regular rhythm. S1 and S2 normal without  gallop, click, rub or other extra sounds   Lungs:Chest clear to auscultation; no wheezes, rhonchi,rales ,or rubs present.No increased work of breathing.  Abdomen: bowel sounds normal, soft and non-tender without masses, organomegaly or hernias noted.  No guarding or rebound.  Vascular : all pulses equal ; no bruits present. Skin:Warm & dry.  Intact without suspicious lesions or rashes ; no tenting  Lymphatic: No lymphadenopathy is noted about the head, neck, axilla Neuro: Strength, tone & DTRs normal.        Assessment & Plan:  #1 GERD, improved  #2 hypertension, controlled  #3 anxiety, controlled  Plan: See orders and recommendations

## 2015-01-04 ENCOUNTER — Telehealth: Payer: Self-pay | Admitting: Internal Medicine

## 2015-01-04 NOTE — Telephone Encounter (Signed)
emmi mailed  °

## 2015-02-23 DIAGNOSIS — H18413 Arcus senilis, bilateral: Secondary | ICD-10-CM | POA: Diagnosis not present

## 2015-02-23 DIAGNOSIS — H10413 Chronic giant papillary conjunctivitis, bilateral: Secondary | ICD-10-CM | POA: Diagnosis not present

## 2015-02-23 DIAGNOSIS — H11153 Pinguecula, bilateral: Secondary | ICD-10-CM | POA: Diagnosis not present

## 2015-02-23 DIAGNOSIS — H04123 Dry eye syndrome of bilateral lacrimal glands: Secondary | ICD-10-CM | POA: Diagnosis not present

## 2015-03-11 ENCOUNTER — Encounter: Payer: Self-pay | Admitting: Internal Medicine

## 2015-03-11 ENCOUNTER — Ambulatory Visit (INDEPENDENT_AMBULATORY_CARE_PROVIDER_SITE_OTHER): Payer: Commercial Managed Care - HMO | Admitting: Internal Medicine

## 2015-03-11 VITALS — BP 130/76 | HR 75 | Temp 98.3°F | Resp 20 | Ht 61.0 in | Wt 162.6 lb

## 2015-03-11 DIAGNOSIS — K219 Gastro-esophageal reflux disease without esophagitis: Secondary | ICD-10-CM | POA: Diagnosis not present

## 2015-03-11 DIAGNOSIS — G4700 Insomnia, unspecified: Secondary | ICD-10-CM | POA: Diagnosis not present

## 2015-03-11 DIAGNOSIS — I10 Essential (primary) hypertension: Secondary | ICD-10-CM

## 2015-03-11 DIAGNOSIS — F411 Generalized anxiety disorder: Secondary | ICD-10-CM | POA: Diagnosis not present

## 2015-03-11 DIAGNOSIS — I8393 Asymptomatic varicose veins of bilateral lower extremities: Secondary | ICD-10-CM | POA: Insufficient documentation

## 2015-03-11 MED ORDER — ESCITALOPRAM OXALATE 10 MG PO TABS: 10.0000 mg | ORAL_TABLET | Freq: Every day | ORAL | Status: DC

## 2015-03-11 MED ORDER — ESOMEPRAZOLE MAGNESIUM 40 MG PO CPDR: 40.0000 mg | DELAYED_RELEASE_CAPSULE | Freq: Every day | ORAL | Status: DC

## 2015-03-11 NOTE — Patient Instructions (Signed)
STOP omeprazole and trazodone  Continue other medications as ordered. Take escitalopram (lexapro) with evening meal.  Follow up in 1 month to re-eval

## 2015-03-11 NOTE — Progress Notes (Signed)
Patient ID: Alexandria Beck, female   DOB: 02/04/1931, 81 y.o.   MRN: [id number]    Location:    [facility]   Place of Service:   OFFICE   Chief Complaint  Patient presents with  . Establish Care    acid reflux    HPI:  81 yo female seen today as a new patient. She is c/a acid reflux with belching and fullness in right ear x several weeks. Tried mylanta OTC. She has epigastric pain and fullness. No N/V. No dysphagia. She has intermittent left neck soreness but has hx spinal stenosis. She takes omeprazole 10m daily but it does not control sx's well.  She has anxiety and takes xanax prn which helps.  She noticed right lateral leg swelling x several months. No pain or known injury  Past Medical History  Diagnosis Date  . Near syncope     diagnosed as anxiety related. gets some relief with clonazepam. No formal testing done.  .Marland KitchenGERD (gastroesophageal reflux disease)     has had EGD 2006-negative  . Allergy     seasonal  . Irregular heart beat     no testing, holter, etc. Takes no medication  . Hypertension      On no medication. Home readings SBP=150's  . Diverticulosis     2004  . Internal hemorrhoids   . Esophageal stricture     Past Surgical History  Procedure Laterality Date  . Abdominal hysterectomy  1974    fibroid tumors w/ metorrhagia  . Tonsillectomy    . G4p4 - nsvd  midwife delivery for two    . Tonsillectomy      Patient Care Team: WHendricks Limes MD as PCP - General (Internal Medicine) WHendricks Limes MD as Consulting Physician (Internal Medicine) SCalton Dach MD as Referring Physician (Optometry)  History   Social History  . Marital Status: Widowed    Spouse Name: N/A  . Number of Children: 4  . Years of Education: 11   Occupational History  . stock person     retired   Social History Main Topics  . Smoking status: Never Smoker   . Smokeless tobacco: Never Used  . Alcohol Use: No  . Drug Use: No  . Sexual Activity: Not Currently    Other Topics Concern  . Not on file   Social History Narrative   Diet:      Do you drink/ eat things with caffeine? yes      Marital status:   widow                            What year were you married ?  1953      Do you live in a house, apartment,assistred living, condo, trailer, etc.)? house      Is it one or more stories? 1 story      How many persons live in your home ?2      Do you have any pets in your home ?(please list)  none      Current or past profession: [employer]     Do you exercise?  yes                            Type & how often:  walking      Do you have a living will?  no  Do you have a DNR form?     no                  If not, do you want to discuss one?       Do you have signed POA?HPOA forms?  no               If so, please bring to your        appointment                          reports that she has never smoked. She has never used smokeless tobacco. She reports that she does not drink alcohol or use illicit drugs.  Family History  Problem Relation Age of Onset  . Heart disease Mother   . Stroke Mother   . Hypertension Mother   . Cancer Father     lung cancer - smoker  . Heart disease Sister     MI  . Kidney disease Sister     end stage renal disease on HD  . Diabetes Sister   . Heart disease Brother   . Diabetes Sister   . Hypertension Sister   . Kidney disease Sister   . Cancer Sister   . Diabetes Sister   . Kidney disease Sister     HD  . Hypertension Sister   . Heart disease Sister   . Hypertension Sister   . Heart disease Sister   . Hypertension Sister   . Hypertension Sister    Family Status  Relation Status Death Age  . Mother Deceased 69  . Father Deceased 66  . Sister Deceased   . Brother Deceased     CAD/MI  . Sister Deceased   . Sister Deceased   . Sister Alive   . Sister Alive   . Sister Alive   . Sister Alive   . Son Alive   . Daughter Alive   . Son Alive   . Son Alive     Immunization  History  Administered Date(s) Administered  . Influenza Split 07/30/2011  . Influenza,inj,Quad PF,36+ Mos 07/13/2014  . Pneumococcal Conjugate-13 09/23/2013    Allergies  Allergen Reactions  . Clonazepam Swelling and Other (See Comments)    Tongue thickening sensation  . Codeine Rash  . Aspirin Nausea And Vomiting and Other (See Comments)    Stomach upset    Medications: Patient's Medications  New Prescriptions   ESCITALOPRAM (LEXAPRO) 10 MG TABLET    Take 1 tablet (10 mg total) by mouth daily.   ESOMEPRAZOLE (NEXIUM) 40 MG CAPSULE    Take 1 capsule (40 mg total) by mouth daily.  Previous Medications   METOPROLOL TARTRATE (LOPRESSOR) 25 MG TABLET    Take 1.5 tablets (37.5 mg total) by mouth 2 (two) times daily.   OMEGA-3 FATTY ACIDS (OMEGA ESSENTIALS BASIC PO)    Take 3 capsules by mouth 3 (three) times daily. Omega XL   OMEPRAZOLE (PRILOSEC) 40 MG CAPSULE    Take 1 capsule (40 mg total) by mouth 2 (two) times daily.   OVER THE COUNTER MEDICATION    Apply 1 application topically daily. "Two Old Goats - Moisturizing lotion"   RESTASIS 0.05 % OPHTHALMIC EMULSION    Apply 1 Dose to eye 2 (two) times daily.   SUCRALFATE (CARAFATE) 1 G TABLET    Take 1 tablet (1 g total) by mouth 3 (three) times daily.   VALSARTAN (DIOVAN)  80 MG TABLET    Take 1 tablet (80 mg total) by mouth daily.   VITAMIN C (ASCORBIC ACID) 500 MG TABLET    Take 500 mg by mouth daily.  Modified Medications   No medications on file  Discontinued Medications   ALPRAZOLAM (XANAX) 0.5 MG TABLET    Take 1 tablet (0.5 mg total) by mouth daily as needed for anxiety.   ARTIFICIAL TEAR OP    Place 1 drop into both eyes 2 (two) times daily.   TRAZODONE (DESYREL) 100 MG TABLET    Take 1 tablet (100 mg total) by mouth at bedtime.    Review of Systems  Constitutional: Negative for fever, chills, diaphoresis, activity change, appetite change and fatigue.  HENT: Positive for dental problem (wears dentures) and tinnitus.  Negative for ear pain and sore throat.   Eyes: Negative for visual disturbance.  Respiratory: Negative for cough, chest tightness and shortness of breath.   Cardiovascular: Positive for leg swelling. Negative for chest pain and palpitations.  Gastrointestinal: Positive for abdominal pain (epigastric) and abdominal distention. Negative for nausea, vomiting, diarrhea, constipation and blood in stool.  Genitourinary: Negative for dysuria.  Musculoskeletal: Positive for arthralgias.  Neurological: Negative for dizziness, tremors, numbness and headaches.  Psychiatric/Behavioral: Negative for sleep disturbance. The patient is nervous/anxious.     Filed Vitals:   03/11/15 1343  BP: 130/76  Pulse: 75  Temp: 98.3 F (36.8 C)  TempSrc: Oral  Resp: 20  Height: 5' 1"  (1.549 m)  Weight: 162 lb 9.6 oz (73.755 kg)  SpO2: 97%   Body mass index is 30.74 kg/(m^2).  Physical Exam  Constitutional: She is oriented to person, place, and time. She appears well-developed and well-nourished.  HENT:  Mouth/Throat: Oropharynx is clear and moist. No oropharyngeal exudate.  Eyes: Pupils are equal, round, and reactive to light. No scleral icterus.  Neck: Neck supple. No tracheal deviation present. No thyromegaly present.  Cardiovascular: Normal rate, regular rhythm and intact distal pulses.  Exam reveals no gallop and no friction rub.   Murmur (2/6 SEM) heard. No LE edema b/l. no calf TTP. No carotid bruit b/l.  R>L lateral leg soft varicosities, no nodularity or TTP  Pulmonary/Chest: Effort normal and breath sounds normal. No stridor. No respiratory distress. She has no wheezes. She has no rales.  Abdominal: Soft. Bowel sounds are normal. She exhibits no distension and no mass. There is no hepatomegaly. There is tenderness (epigastric). There is no rebound and no guarding.  Lymphadenopathy:    She has no cervical adenopathy.  Neurological: She is alert and oriented to person, place, and time.  Skin: Skin  is warm and dry. No rash noted.  Frontal balding  Psychiatric: She has a normal mood and affect. Her behavior is normal. Thought content normal.     Labs reviewed: Appointment on 01/03/2015  Component Date Value Ref Range Status  . Sodium 01/03/2015 137  135 - 145 mEq/L Final  . Potassium 01/03/2015 3.6  3.5 - 5.1 mEq/L Final  . Chloride 01/03/2015 104  96 - 112 mEq/L Final  . CO2 01/03/2015 26  19 - 32 mEq/L Final  . Glucose, Bld 01/03/2015 101* 70 - 99 mg/dL Final  . BUN 01/03/2015 14  6 - 23 mg/dL Final  . Creatinine, Ser 01/03/2015 0.84  0.40 - 1.20 mg/dL Final  . Calcium 01/03/2015 9.5  8.4 - 10.5 mg/dL Final  . GFR 01/03/2015 83.58  >60.00 mL/min Final  . TSH 01/03/2015 0.64  0.35 -  4.50 uIU/mL Final  Admission on 12/21/2014, Discharged on 12/22/2014  Component Date Value Ref Range Status  . Sodium 12/21/2014 138  135 - 145 mmol/L Final  . Potassium 12/21/2014 5.8* 3.5 - 5.1 mmol/L Final   MARKED HEMOLYSIS  . Chloride 12/21/2014 109  96 - 112 mmol/L Final  . CO2 12/21/2014 21  19 - 32 mmol/L Final  . Glucose, Bld 12/21/2014 105* 70 - 99 mg/dL Final  . BUN 12/21/2014 9  6 - 23 mg/dL Final  . Creatinine, Ser 12/21/2014 0.81  0.50 - 1.10 mg/dL Final  . Calcium 12/21/2014 8.8  8.4 - 10.5 mg/dL Final  . GFR calc non Af Amer 12/21/2014 66* >90 mL/min Final  . GFR calc Af Amer 12/21/2014 77* >90 mL/min Final   Comment: (NOTE) The eGFR has been calculated using the CKD EPI equation. This calculation has not been validated in all clinical situations. eGFR's persistently <90 mL/min signify possible Chronic Kidney Disease.   . Anion gap 12/21/2014 8  5 - 15 Final  . WBC 12/21/2014 5.4  4.0 - 10.5 K/uL Final  . RBC 12/21/2014 3.89  3.87 - 5.11 MIL/uL Final  . Hemoglobin 12/21/2014 11.9* 12.0 - 15.0 g/dL Final  . HCT 12/21/2014 34.8* 36.0 - 46.0 % Final  . MCV 12/21/2014 89.5  78.0 - 100.0 fL Final  . MCH 12/21/2014 30.6  26.0 - 34.0 pg Final  . MCHC 12/21/2014 34.2  30.0 - 36.0  g/dL Final  . RDW 12/21/2014 12.5  11.5 - 15.5 % Final  . Platelets 12/21/2014 303  150 - 400 K/uL Final  . Neutrophils Relative % 12/21/2014 50  43 - 77 % Final  . Neutro Abs 12/21/2014 2.7  1.7 - 7.7 K/uL Final  . Lymphocytes Relative 12/21/2014 43  12 - 46 % Final  . Lymphs Abs 12/21/2014 2.3  0.7 - 4.0 K/uL Final  . Monocytes Relative 12/21/2014 5  3 - 12 % Final  . Monocytes Absolute 12/21/2014 0.3  0.1 - 1.0 K/uL Final  . Eosinophils Relative 12/21/2014 2  0 - 5 % Final  . Eosinophils Absolute 12/21/2014 0.1  0.0 - 0.7 K/uL Final  . Basophils Relative 12/21/2014 0  0 - 1 % Final  . Basophils Absolute 12/21/2014 0.0  0.0 - 0.1 K/uL Final  . Troponin I 12/21/2014 <0.03  <0.031 ng/mL Final   Comment:        NO INDICATION OF MYOCARDIAL INJURY.   . Color, Urine 12/21/2014 YELLOW  YELLOW Final  . APPearance 12/21/2014 CLEAR  CLEAR Final  . Specific Gravity, Urine 12/21/2014 1.018  1.005 - 1.030 Final  . pH 12/21/2014 8.0  5.0 - 8.0 Final  . Glucose, UA 12/21/2014 NEGATIVE  NEGATIVE mg/dL Final  . Hgb urine dipstick 12/21/2014 NEGATIVE  NEGATIVE Final  . Bilirubin Urine 12/21/2014 NEGATIVE  NEGATIVE Final  . Ketones, ur 12/21/2014 NEGATIVE  NEGATIVE mg/dL Final  . Protein, ur 12/21/2014 NEGATIVE  NEGATIVE mg/dL Final  . Urobilinogen, UA 12/21/2014 1.0  0.0 - 1.0 mg/dL Final  . Nitrite 12/21/2014 NEGATIVE  NEGATIVE Final  . Leukocytes, UA 12/21/2014 NEGATIVE  NEGATIVE Final   MICROSCOPIC NOT DONE ON URINES WITH NEGATIVE PROTEIN, BLOOD, LEUKOCYTES, NITRITE, OR GLUCOSE <1000 mg/dL.  [name] Potassium 12/21/2014 3.9  3.5 - 5.1 mmol/L Final   DELTA CHECK NOTED  . Troponin I 12/21/2014 <0.03  <0.031 ng/mL Final   Comment:        NO INDICATION OF MYOCARDIAL INJURY.   [name]  Troponin I 12/21/2014 <0.03  <0.031 ng/mL Final   Comment:        NO INDICATION OF MYOCARDIAL INJURY.   . Troponin I 12/22/2014 <0.03  <0.031 ng/mL Final   Comment:        NO INDICATION OF MYOCARDIAL INJURY.   .  Troponin I 12/22/2014 <0.03  <0.031 ng/mL Final   Comment:        NO INDICATION OF MYOCARDIAL INJURY.     No results found.   Assessment/Plan   ICD-9-CM ICD-10-CM   1. Gastroesophageal reflux disease without esophagitis - uncontrolled on omeprazole 530.81 K21.9 esomeprazole (NEXIUM) 40 MG capsule  2. Insomnia - related to #3 780.52 G47.00 escitalopram (LEXAPRO) 10 MG tablet  3. Generalized anxiety disorder - uncontrolled on prn xanax 300.02 F41.1 escitalopram (LEXAPRO) 10 MG tablet  4. Varicose veins of both lower extremities - stable 454.9 I83.93   5. Essential hypertension stable; cont med 401.9 I10     --stop omeprazole tx. Start nexium  --start lexapro every evening. Side effects discussed  --reassurance given for varicose veins. Will follow and monitor  --continue other medications as ordered  --f/u in 1 month to re-assess mood. Avoid BZDs due to age  Cordella Register. Perlie Gold  [facility] [address] [place], [place] [address] [phone] Cell (Monday-Friday 8 AM - 5 PM) [phone] After 5 PM and follow prompts

## 2015-03-15 ENCOUNTER — Telehealth: Payer: Self-pay | Admitting: *Deleted

## 2015-03-15 DIAGNOSIS — G4700 Insomnia, unspecified: Secondary | ICD-10-CM

## 2015-03-15 DIAGNOSIS — F411 Generalized anxiety disorder: Secondary | ICD-10-CM

## 2015-03-15 NOTE — Telephone Encounter (Signed)
Patient called and stated that the Lexapro you gave her makes her feel weird and felt terrible. Patient stopped the medication. Please advise.

## 2015-03-17 NOTE — Telephone Encounter (Signed)
LMOM to return call.

## 2015-03-17 NOTE — Telephone Encounter (Signed)
Recommend she takes 1/2 tab qhs instead of a whole tab. Call back if sx's return.

## 2015-03-18 ENCOUNTER — Telehealth: Payer: Self-pay

## 2015-03-18 MED ORDER — ESCITALOPRAM OXALATE 10 MG PO TABS: ORAL_TABLET | ORAL | Status: DC

## 2015-03-18 NOTE — Telephone Encounter (Signed)
Patient Notified and agreed. 

## 2015-03-18 NOTE — Telephone Encounter (Signed)
Patient called to educate on Medicare Wellness apt. LVM for the patient to call back to educate and schedule for wellness visit.   

## 2015-04-20 ENCOUNTER — Encounter: Payer: Self-pay | Admitting: Internal Medicine

## 2015-04-20 ENCOUNTER — Ambulatory Visit (INDEPENDENT_AMBULATORY_CARE_PROVIDER_SITE_OTHER): Payer: Commercial Managed Care - HMO | Admitting: Internal Medicine

## 2015-04-20 VITALS — BP 116/76 | HR 71 | Temp 97.7°F | Resp 20 | Ht 61.0 in | Wt 159.0 lb

## 2015-04-20 DIAGNOSIS — K219 Gastro-esophageal reflux disease without esophagitis: Secondary | ICD-10-CM

## 2015-04-20 DIAGNOSIS — Z8719 Personal history of other diseases of the digestive system: Secondary | ICD-10-CM | POA: Diagnosis not present

## 2015-04-20 DIAGNOSIS — I10 Essential (primary) hypertension: Secondary | ICD-10-CM

## 2015-04-20 DIAGNOSIS — R1310 Dysphagia, unspecified: Secondary | ICD-10-CM

## 2015-04-20 DIAGNOSIS — F411 Generalized anxiety disorder: Secondary | ICD-10-CM

## 2015-04-20 MED ORDER — TETANUS-DIPHTH-ACELL PERTUSSIS 5-2.5-18.5 LF-MCG/0.5 IM SUSP: 0.5000 mL | Freq: Once | INTRAMUSCULAR | Status: DC

## 2015-04-20 MED ORDER — DIAZEPAM 2 MG PO TABS: ORAL_TABLET | ORAL | Status: DC

## 2015-04-20 NOTE — Progress Notes (Signed)
Patient ID: Alexandria Beck, female   DOB: 06/12/1931, 81 y.o.   MRN: [id number]    Location:    [facility]   Place of Service:   OFFICE  Chief Complaint  Patient presents with  . Medical Management of Chronic Issues    1 month follow-up    HPI:  81 yo female seen today for f/u anxiety. She tried lexapro but it made her "feel funny" and she stopped taking it.  She is drinking chamomile and sleepy time tea which helps. She has dreams that awaken her from sleep.   She has difficulty swallowing at times. (+) hx esophageal stricture. Last EGD several yrs ago. nexium helps bloating    Past Medical History  Diagnosis Date  . Near syncope     diagnosed as anxiety related. gets some relief with clonazepam. No formal testing done.  [name] GERD (gastroesophageal reflux disease)     has had EGD 2006-negative  . Allergy     seasonal  . Irregular heart beat     no testing, holter, etc. Takes no medication  . Hypertension      On no medication. Home readings SBP=150's  . Diverticulosis     2004  . Internal hemorrhoids   . Esophageal stricture     Past Surgical History  Procedure Laterality Date  . Abdominal hysterectomy  1974    fibroid tumors w/ metorrhagia  . Tonsillectomy    . G4p4 - nsvd  midwife delivery for two    . Tonsillectomy      Patient Care Team: Gildardo Cranker, DO as PCP - General (Internal Medicine) Hendricks Limes, MD as Consulting Physician (Internal Medicine) Calton Dach, MD as Referring Physician (Optometry)  History   Social History  . Marital Status: Widowed    Spouse Name: N/A  . Number of Children: 4  . Years of Education: 11   Occupational History  . stock person     retired   Social History Main Topics  . Smoking status: Never Smoker   . Smokeless tobacco: Never Used  . Alcohol Use: No  . Drug Use: No  . Sexual Activity: Not Currently   Other Topics Concern  . Not on file   Social History Narrative   Diet:      Do you drink/ eat things  with caffeine? yes      Marital status:   widow                            What year were you married ?  1953      Do you live in a house, apartment,assistred living, condo, trailer, etc.)? house      Is it one or more stories? 1 story      How many persons live in your home ?2      Do you have any pets in your home ?(please list)  none      Current or past profession: [employer]      Do you exercise?  yes                            Type & how often:  walking      Do you have a living will?  no      Do you have a DNR form?     no  If not, do you want to discuss one?       Do you have signed POA?HPOA forms?  no               If so, please bring to your        appointment                          reports that she has never smoked. She has never used smokeless tobacco. She reports that she does not drink alcohol or use illicit drugs.  Allergies  Allergen Reactions  . Clonazepam Swelling and Other (See Comments)    Tongue thickening sensation  . Codeine Rash  . Aspirin Nausea And Vomiting and Other (See Comments)    Stomach upset    Medications: Patient's Medications  New Prescriptions   No medications on file  Previous Medications   ESCITALOPRAM (LEXAPRO) 10 MG TABLET    Take 1/2 tablet by mouth once daily at bedtime for anxiety and help rest   ESOMEPRAZOLE (NEXIUM) 40 MG CAPSULE    Take 1 capsule (40 mg total) by mouth daily.   METOPROLOL TARTRATE (LOPRESSOR) 25 MG TABLET    Take 1.5 tablets (37.5 mg total) by mouth 2 (two) times daily.   OMEGA-3 FATTY ACIDS (OMEGA ESSENTIALS BASIC PO)    Take 3 capsules by mouth 3 (three) times daily. Omega XL   OVER THE COUNTER MEDICATION    Apply 1 application topically daily. "Two Old Goats - Moisturizing lotion"   RESTASIS 0.05 % OPHTHALMIC EMULSION    Apply 1 Dose to eye 2 (two) times daily.   SUCRALFATE (CARAFATE) 1 G TABLET    Take 1 tablet (1 g total) by mouth 3 (three) times daily.   VALSARTAN (DIOVAN) 80 MG  TABLET    Take 1 tablet (80 mg total) by mouth daily.   VITAMIN C (ASCORBIC ACID) 500 MG TABLET    Take 500 mg by mouth daily.  Modified Medications   No medications on file  Discontinued Medications   No medications on file    Review of Systems  Constitutional: Negative for fever, chills, diaphoresis, activity change, appetite change and fatigue.  HENT: Positive for trouble swallowing. Negative for ear pain and sore throat.   Eyes: Negative for visual disturbance.  Respiratory: Negative for cough, chest tightness and shortness of breath.   Cardiovascular: Negative for chest pain, palpitations and leg swelling.  Gastrointestinal: Negative for nausea, vomiting, abdominal pain, diarrhea, constipation and blood in stool.  Genitourinary: Negative for dysuria.  Musculoskeletal: Negative for arthralgias.  Neurological: Negative for dizziness, tremors, numbness and headaches.  Psychiatric/Behavioral: Negative for sleep disturbance. The patient is nervous/anxious.     Filed Vitals:   04/20/15 0832  BP: 116/76  Pulse: 71  Temp: 97.7 F (36.5 C)  TempSrc: Oral  Resp: 20  Height: 5\' 1"  (1.549 m)  Weight: 159 lb (72.122 kg)  SpO2: 95%   Body mass index is 30.06 kg/(m^2).  Physical Exam  Constitutional: She is oriented to person, place, and time. She appears well-developed and well-nourished. No distress.  Cardiovascular: Normal rate, regular rhythm and intact distal pulses.  Exam reveals friction rub. Exam reveals no gallop.   No murmur heard. Pulmonary/Chest: Effort normal and breath sounds normal. She has no wheezes. She has no rales.  Abdominal: Soft. Bowel sounds are normal. She exhibits no mass. There is no tenderness. There is no rebound and no guarding.  Musculoskeletal: She  exhibits edema and tenderness.  Neurological: She is alert and oriented to person, place, and time.  Skin: Skin is warm and dry. No rash noted.  Psychiatric: She has a normal mood and affect. Her behavior  is normal.     Labs reviewed: No visits with results within 3 Month(s) from this visit. Latest known visit with results is:  Appointment on 01/03/2015  Component Date Value Ref Range Status  . Sodium 01/03/2015 137  135 - 145 mEq/L Final  . Potassium 01/03/2015 3.6  3.5 - 5.1 mEq/L Final  . Chloride 01/03/2015 104  96 - 112 mEq/L Final  . CO2 01/03/2015 26  19 - 32 mEq/L Final  . Glucose, Bld 01/03/2015 101* 70 - 99 mg/dL Final  . BUN 01/03/2015 14  6 - 23 mg/dL Final  . Creatinine, Ser 01/03/2015 0.84  0.40 - 1.20 mg/dL Final  . Calcium 01/03/2015 9.5  8.4 - 10.5 mg/dL Final  . GFR 01/03/2015 83.58  >60.00 mL/min Final  . TSH 01/03/2015 0.64  0.35 - 4.50 uIU/mL Final    No results found.   Assessment/Plan   ICD-9-CM ICD-10-CM   1. Dysphagia - probably due to #5 787.20 R13.10 Ambulatory referral to Gastroenterology  2. Gastroesophageal reflux disease without esophagitis - improved 530.81 K21.9 Ambulatory referral to Gastroenterology  3. Generalized anxiety disorder - unchanged 300.02 F41.1   4. Essential hypertension - stable 401.9 G29 Basic Metabolic Panel  5. History of esophageal stricture V12.79 Z87.19 Ambulatory referral to Gastroenterology   --STOP lexapro due to ADRs  --Continue other medications as ordered  --Will call with GI appointment  --Follow up in 4 mos for routine visit. Check lab prior to appt  [facility] S. Perlie Gold  [facility] [address] [place], [place] [address] [phone] Cell (Monday-Friday 8 AM - 5 PM) [phone] After 5 PM and follow prompts

## 2015-04-20 NOTE — Patient Instructions (Signed)
STOP lexapro  Continue other medications as ordered  Will call with GI appointment  Follow up in 4 mos for routine visit

## 2015-06-08 ENCOUNTER — Encounter: Payer: Self-pay | Admitting: Gastroenterology

## 2015-07-05 ENCOUNTER — Ambulatory Visit (INDEPENDENT_AMBULATORY_CARE_PROVIDER_SITE_OTHER): Payer: Commercial Managed Care - HMO | Admitting: Gastroenterology

## 2015-07-05 ENCOUNTER — Encounter: Payer: Self-pay | Admitting: Gastroenterology

## 2015-07-05 VITALS — BP 152/84 | HR 68 | Ht 60.5 in | Wt 157.4 lb

## 2015-07-05 DIAGNOSIS — K219 Gastro-esophageal reflux disease without esophagitis: Secondary | ICD-10-CM

## 2015-07-05 NOTE — Patient Instructions (Signed)
Follow up as needed

## 2015-07-05 NOTE — Assessment & Plan Note (Signed)
Symptoms are well-controlled Nexium.  I advised her to use antacid for break through symptoms including fullness in her chest or upper abdomen.

## 2015-07-05 NOTE — Progress Notes (Signed)
      History of Present Illness:  Ms. Plass has returned for follow-up of reflux.  On a regimen of Nexium alone she's feeling fairly well.  She has occasional fullness in her lower abdomen after retiring.  This is relieved if she belches.    Review of Systems: Pertinent positive and negative review of systems were noted in the above HPI section. All other review of systems were otherwise negative.    Current Medications, Allergies, Past Medical History, Past Surgical History, Family History and Social History were reviewed in [place] record  Vital signs were reviewed in today's medical record. Physical Exam: General: Well developed , well nourished, no acute distress   See Assessment and Plan under Problem List

## 2015-08-19 ENCOUNTER — Encounter: Payer: Self-pay | Admitting: Internal Medicine

## 2015-08-19 ENCOUNTER — Ambulatory Visit (INDEPENDENT_AMBULATORY_CARE_PROVIDER_SITE_OTHER): Payer: Commercial Managed Care - HMO | Admitting: Internal Medicine

## 2015-08-19 VITALS — BP 136/80 | HR 80 | Temp 98.2°F | Resp 20 | Ht 61.0 in | Wt 155.8 lb

## 2015-08-19 DIAGNOSIS — I10 Essential (primary) hypertension: Secondary | ICD-10-CM

## 2015-08-19 DIAGNOSIS — I8393 Asymptomatic varicose veins of bilateral lower extremities: Secondary | ICD-10-CM | POA: Diagnosis not present

## 2015-08-19 DIAGNOSIS — F411 Generalized anxiety disorder: Secondary | ICD-10-CM | POA: Diagnosis not present

## 2015-08-19 DIAGNOSIS — G4700 Insomnia, unspecified: Secondary | ICD-10-CM

## 2015-08-19 DIAGNOSIS — K219 Gastro-esophageal reflux disease without esophagitis: Secondary | ICD-10-CM | POA: Diagnosis not present

## 2015-08-19 MED ORDER — VALSARTAN 80 MG PO TABS: 80.0000 mg | ORAL_TABLET | Freq: Every day | ORAL | Status: DC

## 2015-08-19 MED ORDER — VENLAFAXINE HCL ER 37.5 MG PO CP24: 37.5000 mg | ORAL_CAPSULE | Freq: Every day | ORAL | Status: DC

## 2015-08-19 NOTE — Patient Instructions (Signed)
Start effexor xr for anxiety. Take 1 capsule daily  Resume carafate 3 times daily. Call office if abdominal pain no better  Continue other medications as ordered  Will call with lab results  Follow up in 1 month for anxiety

## 2015-08-19 NOTE — Progress Notes (Signed)
Patient ID: Alexandria Beck, female   DOB: 10-15-1931, 81 y.o.   MRN: [id number]    Location:    [facility]    Place of Service:   OFFICE  Chief Complaint  Patient presents with  . Medical Management of Chronic Issues    still having pain at top of stomach    HPI:  81 yo female seen today for f/u. She c/o epigastric abdominal pain and has a hx GERD. The pain is associated with increased belching more so at night. She feels fullness in her abdomen. She is currently taking nexium daily and occasionally uses chewable antacid. She has not been taking carafate prn. No nausea or vomiting. No change in bowel habits. Occasional constipation that resolves with epsom salt. Last EGD in 2006 was neg. No sensation of food getting stuck. Sx's do not awaken from sleep. She saw GI last month and no changes made to her regimen.  HTN - stable on diovan. Needs med RF  GAD - sx's uncontrolled and not relieved with valium, although she rarely takes it. Tried lexapro but it caused ADR "feel funny"  She had her flu shot at the local pharmacy  Past Medical History  Diagnosis Date  . Near syncope     diagnosed as anxiety related. gets some relief with clonazepam. No formal testing done.  [name] GERD (gastroesophageal reflux disease)     has had EGD 2006-negative  . Allergy     seasonal  . Irregular heart beat     no testing, holter, etc. Takes no medication  . Hypertension      On no medication. Home readings SBP=150's  . Diverticulosis     2004  . Internal hemorrhoids   . Esophageal stricture     Past Surgical History  Procedure Laterality Date  . Abdominal hysterectomy  1974    fibroid tumors w/ metorrhagia  . Tonsillectomy    . G4p4 - nsvd  midwife delivery for two    . Tonsillectomy      Patient Care Team: Gildardo Cranker, DO as PCP - General (Internal Medicine) Hendricks Limes, MD as Consulting Physician (Internal Medicine) Calton Dach, MD as Referring Physician [facility])  Social History    Social History  . Marital Status: Widowed    Spouse Name: N/A  . Number of Children: 4  . Years of Education: 11   Occupational History  . stock person     retired   Social History Main Topics  . Smoking status: Never Smoker   . Smokeless tobacco: Never Used  . Alcohol Use: No  . Drug Use: No  . Sexual Activity: Not Currently   Other Topics Concern  . Not on file   Social History Narrative   Diet:      Do you drink/ eat things with caffeine? yes      Marital status:   widow                            What year were you married ?  1953      Do you live in a house, apartment,assistred living, condo, trailer, etc.)? house      Is it one or more stories? 1 story      How many persons live in your home ?2      Do you have any pets in your home ?(please list)  none      Current or past  profession: [employer] you exercise?  yes                            Type & how often:  walking      Do you have a living will?  no      Do you have a DNR form?     no                  If not, do you want to discuss one?       Do you have signed POA?HPOA forms?  no               If so, please bring to your        appointment                          reports that she has never smoked. She has never used smokeless tobacco. She reports that she does not drink alcohol or use illicit drugs.  Allergies  Allergen Reactions  . Clonazepam Swelling and Other (See Comments)    Tongue thickening sensation  . Codeine Rash  . Aspirin Nausea And Vomiting and Other (See Comments)    Stomach upset    Medications: Patient's Medications  New Prescriptions   No medications on file  Previous Medications   DIAZEPAM (VALIUM) 2 MG TABLET    May take 1/2 - 1 tablet TID prn anxiety   ESOMEPRAZOLE (NEXIUM) 40 MG CAPSULE    Take 1 capsule (40 mg total) by mouth daily.   OMEGA-3 FATTY ACIDS (OMEGA ESSENTIALS BASIC PO)    Take 3 capsules by mouth 3 (three) times daily. Omega XL   OVER THE  COUNTER MEDICATION    Apply 1 application topically daily. "Two Old Goats - Moisturizing lotion"   PATADAY 0.2 % SOLN    INT 1 GTT IN OU D IN THE MORNING   RESTASIS 0.05 % OPHTHALMIC EMULSION    Apply 1 Dose to eye 2 (two) times daily.   SUCRALFATE (CARAFATE) 1 G TABLET    Take 1 tablet (1 g total) by mouth 3 (three) times daily.   TDAP (BOOSTRIX) 5-2.5-18.5 LF-MCG/0.5 INJECTION    Inject 0.5 mLs into the muscle once.   VALSARTAN (DIOVAN) 80 MG TABLET    Take 1 tablet (80 mg total) by mouth daily.   VITAMIN C (ASCORBIC ACID) 500 MG TABLET    Take 500 mg by mouth daily.  Modified Medications   No medications on file  Discontinued Medications   No medications on file    Review of Systems  Filed Vitals:   08/19/15 0823  BP: 136/80  Pulse: 80  Temp: 98.2 F (36.8 C)  TempSrc: Oral  Resp: 20  Height: 5\' 1"  (1.549 m)  Weight: 155 lb 12.8 oz (70.67 kg)  SpO2: 94%   Body mass index is 29.45 kg/(m^2).  Physical Exam  Constitutional: She is oriented to person, place, and time. She appears well-developed and well-nourished.  HENT:  Mouth/Throat: Oropharynx is clear and moist. No oropharyngeal exudate.  Eyes: Pupils are equal, round, and reactive to light. No scleral icterus.  Neck: Neck supple. Carotid bruit is not present. No tracheal deviation present. No thyromegaly present.  Cardiovascular: Normal rate, regular rhythm, normal heart sounds and intact distal pulses.  Exam reveals no gallop and no friction rub.   No murmur  heard. No LE edema b/l. no calf TTP. B/l LE varicose veins, soft, NT  Pulmonary/Chest: Effort normal and breath sounds normal. No stridor. No respiratory distress. She has no wheezes. She has no rales.  Abdominal: Soft. Bowel sounds are normal. She exhibits no distension and no mass. There is no hepatomegaly. There is tenderness (epigastric). There is no rebound and no guarding.  Lymphadenopathy:    She has no cervical adenopathy.  Neurological: She is alert and  oriented to person, place, and time.  Skin: Skin is warm and dry. No rash noted.  Psychiatric: She has a normal mood and affect. Her behavior is normal. Judgment and thought content normal.     Labs reviewed: No visits with results within 3 Month(s) from this visit. Latest known visit with results is:  Appointment on 01/03/2015  Component Date Value Ref Range Status  . Sodium 01/03/2015 137  135 - 145 mEq/L Final  . Potassium 01/03/2015 3.6  3.5 - 5.1 mEq/L Final  . Chloride 01/03/2015 104  96 - 112 mEq/L Final  . CO2 01/03/2015 26  19 - 32 mEq/L Final  . Glucose, Bld 01/03/2015 101* 70 - 99 mg/dL Final  . BUN 01/03/2015 14  6 - 23 mg/dL Final  . Creatinine, Ser 01/03/2015 0.84  0.40 - 1.20 mg/dL Final  . Calcium 01/03/2015 9.5  8.4 - 10.5 mg/dL Final  . GFR 01/03/2015 83.58  >60.00 mL/min Final  . TSH 01/03/2015 0.64  0.35 - 4.50 uIU/mL Final    No results found.   Assessment/Plan   ICD-9-CM ICD-10-CM   1. Gastroesophageal reflux disease without esophagitis - failing to change as expected 530.81 K21.9   2. Essential hypertension - stable 401.9 I10 valsartan (DIOVAN) 80 MG tablet  3. Generalized anxiety disorder - uncontrolled 300.02 F41.1 venlafaxine XR (EFFEXOR XR) 37.5 MG 24 hr capsule  4. Insomnia - unchanged 780.52 G47.00   5. Varicose veins of both lower extremities -stable 454.9 I83.93     Start effexor xr for anxiety. Take 1 capsule daily  Resume carafate 3 times daily. Call office if abdominal pain no better. T/c adding zantac 150mg  qhs if no response to carafate  Continue other medications as ordered  Will call with lab results  Follow up in 1 month for anxiety  Alexandria Beck  [facility] and Adult Medicine [address] [place], [place] [address] [phone] Cell (Monday-Friday 8 AM - 5 PM) [phone] After 5 PM and follow prompts

## 2015-08-20 LAB — BASIC METABOLIC PANEL
BUN / CREAT RATIO: 14 (ref 11–26)
BUN: 12 mg/dL (ref 8–27)
CHLORIDE: 103 mmol/L (ref 97–106)
CO2: 22 mmol/L (ref 18–29)
Calcium: 9.5 mg/dL (ref 8.7–10.3)
Creatinine, Ser: 0.83 mg/dL (ref 0.57–1.00)
GFR, EST AFRICAN AMERICAN: 76 mL/min/{1.73_m2} (ref >59)
GFR, EST NON AFRICAN AMERICAN: 66 mL/min/{1.73_m2} (ref >59)
Glucose: 95 mg/dL (ref 65–99)
Potassium: 4.1 mmol/L (ref 3.5–5.2)
Sodium: 142 mmol/L (ref 136–144)

## 2015-09-28 ENCOUNTER — Encounter: Payer: Self-pay | Admitting: Internal Medicine

## 2015-09-28 ENCOUNTER — Ambulatory Visit (INDEPENDENT_AMBULATORY_CARE_PROVIDER_SITE_OTHER): Payer: Medicare Other | Admitting: Internal Medicine

## 2015-09-28 VITALS — BP 108/72 | HR 95 | Temp 98.7°F | Resp 20 | Ht 61.0 in | Wt 154.2 lb

## 2015-09-28 DIAGNOSIS — F411 Generalized anxiety disorder: Secondary | ICD-10-CM | POA: Diagnosis not present

## 2015-09-28 MED ORDER — DIAZEPAM 5 MG PO TABS
5.0000 mg | ORAL_TABLET | Freq: Three times a day (TID) | ORAL | Status: DC | PRN
Start: 2015-09-28 — End: 2016-01-26

## 2015-09-28 MED ORDER — VENLAFAXINE HCL ER 75 MG PO CP24: 75.0000 mg | ORAL_CAPSULE | Freq: Every day | ORAL | Status: DC

## 2015-09-28 NOTE — Patient Instructions (Signed)
Increase effexor xr to 75mg  daily  Increase diazepam to 5mg  every 8 hours as needed for nervousness  Continue other medications as ordered  Follow up in 3 mos for routine visit

## 2015-09-28 NOTE — Progress Notes (Signed)
Patient ID: Alexandria Beck, female   DOB: 03/02/1931, 81 y.o.   MRN: [id number]    Location:    [facility]     Place of Service:   OFFICE   Chief Complaint  Patient presents with  . Medical Management of Chronic Issues    1 month follow-up for aniexty and medication adherence    HPI:  81 yo female seen today for f/u anxiety. She was started on effexor xr at last OV. Anxiety improved since last visit but she has frequent breakthrough sx's that are not relieved with valium. She takes diazepam BID. Sleeping better over the last month  Past Medical History  Diagnosis Date  . Near syncope     diagnosed as anxiety related. gets some relief with clonazepam. No formal testing done.  [name] GERD (gastroesophageal reflux disease)     has had EGD 2006-negative  . Allergy     seasonal  . Irregular heart beat     no testing, holter, etc. Takes no medication  . Hypertension      On no medication. Home readings SBP=150's  . Diverticulosis     2004  . Internal hemorrhoids   . Esophageal stricture     Past Surgical History  Procedure Laterality Date  . Abdominal hysterectomy  1974    fibroid tumors w/ metorrhagia  . Tonsillectomy    . G4p4 - nsvd  midwife delivery for two    . Tonsillectomy      Patient Care Team: Gildardo Cranker, DO as PCP - General (Internal Medicine) Hendricks Limes, MD as Consulting Physician (Internal Medicine) Calton Dach, MD as Referring Physician [facility])  Social History   Social History  . Marital Status: Widowed    Spouse Name: N/A  . Number of Children: 4  . Years of Education: 11   Occupational History  . stock person     retired   Social History Main Topics  . Smoking status: Never Smoker   . Smokeless tobacco: Never Used  . Alcohol Use: No  . Drug Use: No  . Sexual Activity: Not Currently   Other Topics Concern  . Not on file   Social History Narrative   Diet:      Do you drink/ eat things with caffeine? yes      Marital status:    widow                            What year were you married ?  1953      Do you live in a house, apartment,assistred living, condo, trailer, etc.)? house      Is it one or more stories? 1 story      How many persons live in your home ?2      Do you have any pets in your home ?(please list)  none      Current or past profession: [employer]      Do you exercise?  yes                            Type & how often:  walking      Do you have a living will?  no      Do you have a DNR form?     no                  If not,  do you want to discuss one?       Do you have signed POA?HPOA forms?  no               If so, please bring to your        appointment                          reports that she has never smoked. She has never used smokeless tobacco. She reports that she does not drink alcohol or use illicit drugs.  Allergies  Allergen Reactions  . Clonazepam Swelling and Other (See Comments)    Tongue thickening sensation  . Codeine Rash  . Lexapro [Escitalopram Oxalate] Other (See Comments)    Made her "feel funny"  . Aspirin Nausea And Vomiting and Other (See Comments)    Stomach upset    Medications: Patient's Medications  New Prescriptions   No medications on file  Previous Medications   DIAZEPAM (VALIUM) 2 MG TABLET    May take 1/2 - 1 tablet TID prn anxiety   ESOMEPRAZOLE (NEXIUM) 40 MG CAPSULE    Take 1 capsule (40 mg total) by mouth daily.   OMEGA-3 FATTY ACIDS (OMEGA ESSENTIALS BASIC PO)    Take 3 capsules by mouth 3 (three) times daily. Omega XL   OVER THE COUNTER MEDICATION    Apply 1 application topically daily. "Two Old Goats - Moisturizing lotion"   PATADAY 0.2 % SOLN    INT 1 GTT IN OU D IN THE MORNING   RESTASIS 0.05 % OPHTHALMIC EMULSION    Apply 1 Dose to eye 2 (two) times daily.   SUCRALFATE (CARAFATE) 1 G TABLET    Take 1 tablet (1 g total) by mouth 3 (three) times daily.   VALSARTAN (DIOVAN) 80 MG TABLET    Take 1 tablet (80 mg total) by mouth daily.    VENLAFAXINE XR (EFFEXOR XR) 37.5 MG 24 HR CAPSULE    Take 1 capsule (37.5 mg total) by mouth daily with breakfast.   VITAMIN C (ASCORBIC ACID) 500 MG TABLET    Take 500 mg by mouth daily.  Modified Medications   No medications on file  Discontinued Medications   TDAP (BOOSTRIX) 5-2.5-18.5 LF-MCG/0.5 INJECTION    Inject 0.5 mLs into the muscle once.    Review of Systems  Constitutional: Negative for fever, chills, diaphoresis, activity change, appetite change and fatigue.  HENT: Negative for ear pain and sore throat.   Eyes: Negative for visual disturbance.  Respiratory: Negative for cough, chest tightness and shortness of breath.   Cardiovascular: Negative for chest pain, palpitations and leg swelling.  Gastrointestinal: Negative for nausea, vomiting, abdominal pain, diarrhea, constipation and blood in stool.  Genitourinary: Negative for dysuria.  Musculoskeletal: Negative for arthralgias.  Neurological: Negative for dizziness, tremors, numbness and headaches.  Psychiatric/Behavioral: Negative for sleep disturbance. The patient is nervous/anxious.     Filed Vitals:   09/28/15 0917  BP: 108/72  Pulse: 95  Temp: 98.7 F (37.1 C)  TempSrc: Oral  Resp: 20  Height: 5\' 1"  (1.549 m)  Weight: 154 lb 3.2 oz (69.945 kg)  SpO2: 97%   Body mass index is 29.15 kg/(m^2).  Physical Exam  Constitutional: She is oriented to person, place, and time. She appears well-developed and well-nourished. No distress.  Cardiovascular: Normal rate, regular rhythm, normal heart sounds and intact distal pulses.  Exam reveals no gallop and no friction rub.   No murmur heard.  Pulmonary/Chest: Effort normal and breath sounds normal. No respiratory distress. She has no wheezes. She has no rales. She exhibits no tenderness.  Neurological: She is alert and oriented to person, place, and time.  Skin: Skin is warm and dry. No rash noted.  Psychiatric: She has a normal mood and affect. Her behavior is normal.  Judgment and thought content normal.     Labs reviewed: Office Visit on 08/19/2015  Component Date Value Ref Range Status  . Glucose 08/19/2015 95  65 - 99 mg/dL Final  . BUN 08/19/2015 12  8 - 27 mg/dL Final  . Creatinine, Ser 08/19/2015 0.83  0.57 - 1.00 mg/dL Final  . GFR calc non Af Amer 08/19/2015 66  >59 mL/min/1.73 Final  . GFR calc Af Amer 08/19/2015 76  >59 mL/min/1.73 Final  . BUN/Creatinine Ratio 08/19/2015 14  11 - 26 Final  . Sodium 08/19/2015 142  136 - 144 mmol/L Final                 **Please note reference interval change**  . Potassium 08/19/2015 4.1  3.5 - 5.2 mmol/L Final                 **Please note reference interval change**  . Chloride 08/19/2015 103  97 - 106 mmol/L Final                 **Please note reference interval change**  . CO2 08/19/2015 22  18 - 29 mmol/L Final  . Calcium 08/19/2015 9.5  8.7 - 10.3 mg/dL Final    No results found.   Assessment/Plan   ICD-9-CM ICD-10-CM   1. Generalized anxiety disorder improving 300.02 F41.1 venlafaxine XR (EFFEXOR-XR) 75 MG 24 hr capsule     diazepam (VALIUM) 5 MG tablet    Increase effexor xr to 75 mg daily  Increase diazepam 5mg  q8hrs prn nervousness  Continue other meds as ordered  Follow up in 3 mos for routine visit. Will check CMP next visit   Teonia Yager S. Perlie Gold  [facility] and Adult Medicine [address] [place], [place] [address] [phone] Cell (Monday-Friday 8 AM - 5 PM) [phone] After 5 PM and follow prompts

## 2015-11-30 DIAGNOSIS — F99 Mental disorder, not otherwise specified: Secondary | ICD-10-CM | POA: Diagnosis not present

## 2015-11-30 DIAGNOSIS — F419 Anxiety disorder, unspecified: Secondary | ICD-10-CM | POA: Diagnosis not present

## 2015-11-30 DIAGNOSIS — F29 Unspecified psychosis not due to a substance or known physiological condition: Secondary | ICD-10-CM | POA: Diagnosis not present

## 2015-12-30 ENCOUNTER — Encounter: Payer: Self-pay | Admitting: Internal Medicine

## 2015-12-30 ENCOUNTER — Ambulatory Visit (INDEPENDENT_AMBULATORY_CARE_PROVIDER_SITE_OTHER): Payer: PPO | Admitting: Internal Medicine

## 2015-12-30 VITALS — BP 110/72 | HR 84 | Temp 98.1°F | Resp 20 | Ht 61.0 in | Wt 164.2 lb

## 2015-12-30 DIAGNOSIS — K219 Gastro-esophageal reflux disease without esophagitis: Secondary | ICD-10-CM

## 2015-12-30 DIAGNOSIS — H6121 Impacted cerumen, right ear: Secondary | ICD-10-CM | POA: Diagnosis not present

## 2015-12-30 DIAGNOSIS — I10 Essential (primary) hypertension: Secondary | ICD-10-CM | POA: Diagnosis not present

## 2015-12-30 DIAGNOSIS — F411 Generalized anxiety disorder: Secondary | ICD-10-CM

## 2015-12-30 DIAGNOSIS — G4700 Insomnia, unspecified: Secondary | ICD-10-CM

## 2015-12-30 NOTE — Progress Notes (Signed)
Patient ID: Alexandria Beck, female   DOB: 03-23-1931, 82 y.o.   MRN: [id number]    Location:    PAM   Place of Service:  OFFICE   Chief Complaint  Patient presents with  . Medical Management of Chronic Issues    3 month follow-up for routine visit    HPI:  82 yo female seen today for f/u. She c/o ear pressure and crackling noises. No tinnitus. No ear pain. Occasional dizziness. No HA or sore throat. No cough. No f/c.  GAD - had severe panic attack 1 month ago and EMS called. Heart checked out. She was not taken to ED. Take effexor xr which has helped but she has frequent breakthrough sx's that are not relieved with valium. She takes diazepam BID. Sleeping better overall. Se feels fatigued and has anhedonia. Denies depression  HTN - BP controlled on diovan  GERD - sx's stable on carafate and nexium  Past Medical History  Diagnosis Date  . Near syncope     diagnosed as anxiety related. gets some relief with clonazepam. No formal testing done.  [name] GERD (gastroesophageal reflux disease)     has had EGD 2006-negative  . Allergy     seasonal  . Irregular heart beat     no testing, holter, etc. Takes no medication  . Hypertension      On no medication. Home readings SBP=150's  . Diverticulosis     2004  . Internal hemorrhoids   . Esophageal stricture     Past Surgical History  Procedure Laterality Date  . Abdominal hysterectomy  1974    fibroid tumors w/ metorrhagia  . Tonsillectomy    . G4p4 - nsvd  midwife delivery for two    . Tonsillectomy      Patient Care Team: Gildardo Cranker, DO as PCP - General (Internal Medicine) Hendricks Limes, MD as Consulting Physician (Internal Medicine) Calton Dach, MD as Referring Physician [facility])  Social History   Social History  . Marital Status: Widowed    Spouse Name: N/A  . Number of Children: 4  . Years of Education: 11   Occupational History  . stock person     retired   Social History Main Topics  . Smoking  status: Never Smoker   . Smokeless tobacco: Never Used  . Alcohol Use: No  . Drug Use: No  . Sexual Activity: Not Currently   Other Topics Concern  . Not on file   Social History Narrative   Diet:      Do you drink/ eat things with caffeine? yes      Marital status:   widow                            What year were you married ?  1953      Do you live in a house, apartment,assistred living, condo, trailer, etc.)? house      Is it one or more stories? 1 story      How many persons live in your home ?2      Do you have any pets in your home ?(please list)  none      Current or past profession: [employer]      Do you exercise?  yes                            Type &  how often:  walking      Do you have a living will?  no      Do you have a DNR form?     no                  If not, do you want to discuss one?       Do you have signed POA?HPOA forms?  no               If so, please bring to your        appointment                          reports that she has never smoked. She has never used smokeless tobacco. She reports that she does not drink alcohol or use illicit drugs.  Allergies  Allergen Reactions  . Clonazepam Swelling and Other (See Comments)    Tongue thickening sensation  . Codeine Rash  . Lexapro [Escitalopram Oxalate] Other (See Comments)    Made her "feel funny"  . Aspirin Nausea And Vomiting and Other (See Comments)    Stomach upset    Medications: Patient's Medications  New Prescriptions   No medications on file  Previous Medications   DIAZEPAM (VALIUM) 5 MG TABLET    Take 1 tablet (5 mg total) by mouth every 8 (eight) hours as needed for anxiety.   ESOMEPRAZOLE (NEXIUM) 40 MG CAPSULE    Take 1 capsule (40 mg total) by mouth daily.   OVER THE COUNTER MEDICATION    Apply 1 application topically daily. "Two Old Goats - Moisturizing lotion"   PATADAY 0.2 % SOLN    INT 1 GTT IN OU D IN THE MORNING   RESTASIS 0.05 % OPHTHALMIC EMULSION    Apply 1  Dose to eye 2 (two) times daily.   SUCRALFATE (CARAFATE) 1 G TABLET    Take 1 tablet (1 g total) by mouth 3 (three) times daily.   VALSARTAN (DIOVAN) 80 MG TABLET    Take 1 tablet (80 mg total) by mouth daily.   VENLAFAXINE XR (EFFEXOR-XR) 75 MG 24 HR CAPSULE    Take 1 capsule (75 mg total) by mouth daily with breakfast.   VITAMIN C (ASCORBIC ACID) 500 MG TABLET    Take 500 mg by mouth daily.  Modified Medications   No medications on file  Discontinued Medications   OMEGA-3 FATTY ACIDS (OMEGA ESSENTIALS BASIC PO)    Take 3 capsules by mouth 3 (three) times daily. Reported on 12/30/2015    Review of Systems  Constitutional: Negative for fever, chills, diaphoresis, activity change, appetite change and fatigue.  HENT: Negative for ear pain and sore throat.   Eyes: Negative for visual disturbance.  Respiratory: Negative for cough, chest tightness and shortness of breath.   Cardiovascular: Negative for chest pain, palpitations and leg swelling.  Gastrointestinal: Negative for nausea, vomiting, abdominal pain, diarrhea, constipation and blood in stool.  Genitourinary: Negative for dysuria.  Musculoskeletal: Negative for arthralgias.  Neurological: Negative for dizziness, tremors, numbness and headaches.  Psychiatric/Behavioral: Negative for sleep disturbance. The patient is nervous/anxious.     Filed Vitals:   12/30/15 1027  BP: 110/72  Pulse: 84  Temp: 98.1 F (36.7 C)  TempSrc: Oral  Resp: 20  Height: 5\' 1"  (1.549 m)  Weight: 164 lb 3.2 oz (74.481 kg)  SpO2: 96%   Body mass index is 31.04 kg/(m^2).  Physical Exam  Constitutional:  She is oriented to person, place, and time. She appears well-developed and well-nourished.  HENT:  Mouth/Throat: Oropharynx is clear and moist. No oropharyngeal exudate.  Right cerumen impaction. Left TM appears dull but intact. No external ear canal swelling or redness. No ear d/c  Eyes: Pupils are equal, round, and reactive to light. No scleral  icterus.  Neck: Neck supple. Carotid bruit is present (b/l systolic). No tracheal deviation present. No thyromegaly present.  Cardiovascular: Normal rate, regular rhythm and intact distal pulses.  Exam reveals no gallop and no friction rub.   Murmur (2/6 SEM --> carotid b/l) heard. No LE edema b/l. no calf TTP.   Pulmonary/Chest: Effort normal and breath sounds normal. No stridor. No respiratory distress. She has no wheezes. She has no rales.  Abdominal: Soft. Bowel sounds are normal. She exhibits no distension and no mass. There is no hepatomegaly. There is no tenderness. There is no rebound and no guarding.  Musculoskeletal: She exhibits edema and tenderness.  Lymphadenopathy:    She has no cervical adenopathy.  Neurological: She is alert and oriented to person, place, and time. She has normal reflexes.  Skin: Skin is warm and dry. No rash noted.  Left forearm egg sized lipoma freely mobile, NT.   Psychiatric: She has a normal mood and affect. Her behavior is normal. Judgment and thought content normal.     Labs reviewed: No visits with results within 3 Month(s) from this visit. Latest known visit with results is:  Office Visit on 08/19/2015  Component Date Value Ref Range Status  . Glucose 08/19/2015 95  65 - 99 mg/dL Final  . BUN 08/19/2015 12  8 - 27 mg/dL Final  . Creatinine, Ser 08/19/2015 0.83  0.57 - 1.00 mg/dL Final  . GFR calc non Af Amer 08/19/2015 66  >59 mL/min/1.73 Final  . GFR calc Af Amer 08/19/2015 76  >59 mL/min/1.73 Final  . BUN/Creatinine Ratio 08/19/2015 14  11 - 26 Final  . Sodium 08/19/2015 142  136 - 144 mmol/L Final                 **Please note reference interval change**  . Potassium 08/19/2015 4.1  3.5 - 5.2 mmol/L Final                 **Please note reference interval change**  . Chloride 08/19/2015 103  97 - 106 mmol/L Final                 **Please note reference interval change**  . CO2 08/19/2015 22  18 - 29 mmol/L Final  . Calcium 08/19/2015 9.5   8.7 - 10.3 mg/dL Final    No results found.   Assessment/Plan   ICD-9-CM ICD-10-CM   1. Cerumen impaction, right 380.4 H61.21 Ear Lavage  2. Generalized anxiety disorder 300.02 F41.1 CMP  3. Essential hypertension 401.9 I10 CMP  4. Gastroesophageal reflux disease without esophagitis 530.81 K21.9   5. Insomnia 780.52 G47.00    Continue current medications as ordered  Right ear lavaged today. Qtip tip flushed out - avoid using Q tips in ears  Follow up in 3 mos for routine visit   Lyla Jasek S. Perlie Gold  [facility] and Adult Medicine [address] [place], [place] [address] [phone] Cell (Monday-Friday 8 AM - 5 PM) [phone] After 5 PM and follow prompts

## 2015-12-30 NOTE — Patient Instructions (Signed)
Continue current medications as ordered  Right ear lavaged today  Follow up in 3 mos for routine visit

## 2015-12-31 LAB — COMPREHENSIVE METABOLIC PANEL
ALT: 18 IU/L (ref 0–32)
AST: 22 IU/L (ref 0–40)
Albumin/Globulin Ratio: 1.4 (ref 1.1–2.5)
Albumin: 4.4 g/dL (ref 3.5–4.7)
Alkaline Phosphatase: 67 IU/L (ref 39–117)
BUN/Creatinine Ratio: 18 (ref 11–26)
BUN: 14 mg/dL (ref 8–27)
Bilirubin Total: <0.2 mg/dL (ref 0.0–1.2)
CALCIUM: 9.9 mg/dL (ref 8.7–10.3)
CO2: 22 mmol/L (ref 18–29)
CREATININE: 0.76 mg/dL (ref 0.57–1.00)
Chloride: 102 mmol/L (ref 96–106)
GFR calc Af Amer: 84 mL/min/{1.73_m2} (ref >59)
GFR, EST NON AFRICAN AMERICAN: 73 mL/min/{1.73_m2} (ref >59)
Globulin, Total: 3.1 g/dL (ref 1.5–4.5)
Glucose: 97 mg/dL (ref 65–99)
POTASSIUM: 4.6 mmol/L (ref 3.5–5.2)
Sodium: 145 mmol/L — ABNORMAL HIGH (ref 134–144)
Total Protein: 7.5 g/dL (ref 6.0–8.5)

## 2016-01-06 ENCOUNTER — Telehealth: Payer: Self-pay

## 2016-01-06 NOTE — Telephone Encounter (Signed)
Patient received a letter stating that [employer] would not longer cover esomerprazole 40 mg capsules due to formulary changes. Approved alternatives include prilosec, protonix, rabeprazole, and lansoprazole.   Prior authorization was initiated by calling [phone]. Patient ID# [id number].   Forms will be faxed to the office to be completed and faxed back to the number listed on the form.

## 2016-01-10 ENCOUNTER — Telehealth: Payer: Self-pay

## 2016-01-10 NOTE — Telephone Encounter (Signed)
Coverage determination request form from [employer] was received for esomeprazole mag DR 40 mg capsules.   Forms were completed and placed in Dr. Vale Haven folder for review and signing.   Copies of OV from this office for 03/11/15 and 08/19/15, along with OV notes from [facility] 07/05/15 were included with request form.   Please fax completed forms to [phone].

## 2016-01-12 NOTE — Telephone Encounter (Signed)
Esomeprazole Mag DR 40 mg capsules will be approved from 01/11/2016 until 10/28/2016.   No approval code was noted on the fax.

## 2016-01-12 NOTE — Telephone Encounter (Signed)
Patient and pharmacy have both been notified of the approval for esomeprazole mag DR 40 mg capsules.

## 2016-01-17 DIAGNOSIS — H52223 Regular astigmatism, bilateral: Secondary | ICD-10-CM | POA: Diagnosis not present

## 2016-01-17 DIAGNOSIS — Z961 Presence of intraocular lens: Secondary | ICD-10-CM | POA: Diagnosis not present

## 2016-01-17 DIAGNOSIS — H5213 Myopia, bilateral: Secondary | ICD-10-CM | POA: Diagnosis not present

## 2016-01-17 DIAGNOSIS — H18413 Arcus senilis, bilateral: Secondary | ICD-10-CM | POA: Diagnosis not present

## 2016-01-17 DIAGNOSIS — H43811 Vitreous degeneration, right eye: Secondary | ICD-10-CM | POA: Diagnosis not present

## 2016-01-17 DIAGNOSIS — H35362 Drusen (degenerative) of macula, left eye: Secondary | ICD-10-CM | POA: Diagnosis not present

## 2016-01-17 DIAGNOSIS — H524 Presbyopia: Secondary | ICD-10-CM | POA: Diagnosis not present

## 2016-01-17 DIAGNOSIS — H11153 Pinguecula, bilateral: Secondary | ICD-10-CM | POA: Diagnosis not present

## 2016-01-26 ENCOUNTER — Other Ambulatory Visit: Payer: Self-pay | Admitting: Internal Medicine

## 2016-02-14 ENCOUNTER — Ambulatory Visit (INDEPENDENT_AMBULATORY_CARE_PROVIDER_SITE_OTHER): Payer: PPO | Admitting: Gastroenterology

## 2016-02-14 ENCOUNTER — Encounter: Payer: Self-pay | Admitting: Gastroenterology

## 2016-02-14 VITALS — BP 110/80 | HR 80 | Ht 61.0 in | Wt 165.0 lb

## 2016-02-14 DIAGNOSIS — K219 Gastro-esophageal reflux disease without esophagitis: Secondary | ICD-10-CM | POA: Diagnosis not present

## 2016-02-14 NOTE — Patient Instructions (Signed)
Thank you for choosing [facility]  Dr Wilfrid Lund III

## 2016-02-14 NOTE — Progress Notes (Signed)
 GI Progress Note  Chief Complaint: neck discomfort  Subjective History:  Alexandria Beck 9/16 and 2/16 for GERD, upper abd bloating/belching EGD 11/2011 with ? Stricture - 54 fr [place]  1 month OK eating, no dysphagia to solids , but feels fullness in neck much of the time and needs to swallow 2-3 times to try and clear sensation. Appetite good, weight stable.  No change in voice. ROS: Cardiovascular:  no chest pain Respiratory: no dyspnea  The patient's Past Medical, Family and Social History were reviewed and are on file in the EMR.  Objective:  Med list reviewed  Vital signs in last 24 hrs: Filed Vitals:   02/14/16 1021  BP: 110/80  Pulse: 80    Physical Exam   HEENT: sclera anicteric, oral mucosa moist without lesions  Neck: supple, no thyromegaly, JVD or lymphadenopathy  Cardiac: RRR without murmurs, S1S2 heard, no peripheral edema  Pulm: clear to auscultation bilaterally, normal RR and effort noted  Abdomen: soft, no tenderness, with active bowel sounds. No guarding or palpable hepatosplenomegaly.  Skin; warm and dry, no jaundice or rash    @ASSESSMENTPLANBEGIN @ Assessment: Encounter Diagnosis  Name Primary?  . Gastroesophageal reflux disease without esophagitis Yes   Sounds like globus sensation. Explained nature of that, may also be hypertensive UES.  However, no solid food dysphagia at present.  Plan: Reassurance.   Continue PPI for GERD RTO if dysphagia occurs   Nelida Meuse III

## 2016-02-22 ENCOUNTER — Ambulatory Visit (INDEPENDENT_AMBULATORY_CARE_PROVIDER_SITE_OTHER): Payer: PPO | Admitting: Internal Medicine

## 2016-02-22 ENCOUNTER — Encounter: Payer: Self-pay | Admitting: Internal Medicine

## 2016-02-22 VITALS — BP 102/70 | HR 103 | Temp 98.3°F | Resp 20 | Ht 61.0 in | Wt 166.4 lb

## 2016-02-22 DIAGNOSIS — F411 Generalized anxiety disorder: Secondary | ICD-10-CM

## 2016-02-22 DIAGNOSIS — H6122 Impacted cerumen, left ear: Secondary | ICD-10-CM | POA: Diagnosis not present

## 2016-02-22 MED ORDER — DIAZEPAM 5 MG PO TABS: 5.0000 mg | ORAL_TABLET | Freq: Three times a day (TID) | ORAL | Status: DC | PRN

## 2016-02-22 NOTE — Patient Instructions (Addendum)
Continue current medications as ordered  Left ear lavage done today  Follow up as scheduled

## 2016-02-22 NOTE — Progress Notes (Signed)
Patient ID: Alexandria Beck, female   DOB: 1931/09/30, 82 y.o.   MRN: [id number]    Location:    [facility]   Place of Service:   OFFICE  Chief Complaint  Patient presents with  . Acute Visit    Patients c/o Patient has ringing in left ear, labs printed    HPI:  82 yo female seen today for tinnitus on left x 1 month. Occasional pain. No d/c or bleeding.  No hearing loss. No ear pressure. She has a hx seasonal allergy  GAD - needs RF for valium. She plans to travel to [place] next week to visit her sisters  Past Medical History  Diagnosis Date  . Near syncope     diagnosed as anxiety related. gets some relief with clonazepam. No formal testing done.  [name] GERD (gastroesophageal reflux disease)     has had EGD 2006-negative  . Allergy     seasonal  . Irregular heart beat     no testing, holter, etc. Takes no medication  . Hypertension      On no medication. Home readings SBP=150's  . Diverticulosis 2004  . Internal hemorrhoids   . Esophageal stricture   . Helicobacter positive gastritis 2006    Past Surgical History  Procedure Laterality Date  . Abdominal hysterectomy  1974    fibroid tumors w/ metorrhagia  . Tonsillectomy    . G4p4 - nsvd  midwife delivery for two    . Tonsillectomy      Patient Care Team: Gildardo Cranker, DO as PCP - General (Internal Medicine) Hendricks Limes, MD as Consulting Physician (Internal Medicine) Calton Dach, MD as Referring Physician [facility])  Social History   Social History  . Marital Status: Widowed    Spouse Name: N/A  . Number of Children: 4  . Years of Education: 11   Occupational History  . stock person     retired   Social History Main Topics  . Smoking status: Never Smoker   . Smokeless tobacco: Never Used  . Alcohol Use: No  . Drug Use: No  . Sexual Activity: Not Currently   Other Topics Concern  . Not on file   Social History Narrative   Diet:      Do you drink/ eat things with caffeine? yes      Marital status:    widow                            What year were you married ?  1953      Do you live in a house, apartment,assistred living, condo, trailer, etc.)? house      Is it one or more stories? 1 story      How many persons live in your home ?2      Do you have any pets in your home ?(please list)  none      Current or past profession: [employer]      Do you exercise?  yes                            Type & how often:  walking      Do you have a living will?  no      Do you have a DNR form?     no  If not, do you want to discuss one?       Do you have signed POA?HPOA forms?  no               If so, please bring to your        appointment                          reports that she has never smoked. She has never used smokeless tobacco. She reports that she does not drink alcohol or use illicit drugs.  Allergies  Allergen Reactions  . Clonazepam Swelling and Other (See Comments)    Tongue thickening sensation  . Codeine Rash  . Lexapro [Escitalopram Oxalate] Other (See Comments)    Made her "feel funny"  . Aspirin Nausea And Vomiting and Other (See Comments)    Stomach upset    Medications: Patient's Medications  New Prescriptions   No medications on file  Previous Medications   DIAZEPAM (VALIUM) 5 MG TABLET    TAKE 1 TABLET BY MOUTH EVERY 8 HOURS AS NEEDED FOR ANXIETY   ESOMEPRAZOLE (NEXIUM) 40 MG CAPSULE    Take 1 capsule (40 mg total) by mouth daily.   OVER THE COUNTER MEDICATION    Apply 1 application topically daily. "Two Old Goats - Moisturizing lotion"   PATADAY 0.2 % SOLN    INT 1 GTT IN OU D IN THE MORNING   RESTASIS 0.05 % OPHTHALMIC EMULSION    Apply 1 Dose to eye 2 (two) times daily.   SUCRALFATE (CARAFATE) 1 G TABLET    Take 1 tablet (1 g total) by mouth 3 (three) times daily.   VALSARTAN (DIOVAN) 80 MG TABLET    Take 1 tablet (80 mg total) by mouth daily.   VENLAFAXINE XR (EFFEXOR-XR) 75 MG 24 HR CAPSULE    Take 1 capsule (75 mg total) by mouth  daily with breakfast.   VITAMIN C (ASCORBIC ACID) 500 MG TABLET    Take 500 mg by mouth daily.  Modified Medications   No medications on file  Discontinued Medications   No medications on file    Review of Systems  HENT: Positive for tinnitus.   All other systems reviewed and are negative.   Filed Vitals:   02/22/16 1406  BP: 102/70  Pulse: 103  Temp: 98.3 F (36.8 C)  TempSrc: Oral  Resp: 20  Height: 5\' 1"  (1.549 m)  Weight: 166 lb 6.4 oz (75.479 kg)  SpO2: 98%   Body mass index is 31.46 kg/(m^2).  Physical Exam  Constitutional: She is oriented to person, place, and time. She appears well-developed and well-nourished.  HENT:  Right TM dull. Left external ear canal cerumen impaction. Oropharynx cobblestoning but no redness or exudate.   Neck: Neck supple.  Musculoskeletal: She exhibits edema.  Lymphadenopathy:       Head (right side): No submandibular, no preauricular, no posterior auricular and no occipital adenopathy present.       Head (left side): No submandibular, no preauricular, no posterior auricular and no occipital adenopathy present.    She has no cervical adenopathy.       Right: No supraclavicular adenopathy present.       Left: No supraclavicular adenopathy present.  Neurological: She is alert and oriented to person, place, and time.  Skin: Skin is warm and dry. No rash noted.  Psychiatric: She has a normal mood and affect. Her behavior is normal. Thought content normal.  Labs reviewed: Office Visit on 12/30/2015  Component Date Value Ref Range Status  . Glucose 12/30/2015 97  65 - 99 mg/dL Final  . BUN 12/30/2015 14  8 - 27 mg/dL Final  . Creatinine, Ser 12/30/2015 0.76  0.57 - 1.00 mg/dL Final  . GFR calc non Af Amer 12/30/2015 73  >59 mL/min/1.73 Final  . GFR calc Af Amer 12/30/2015 84  >59 mL/min/1.73 Final  . BUN/Creatinine Ratio 12/30/2015 18  11 - 26 Final  . Sodium 12/30/2015 145* 134 - 144 mmol/L Final  . Potassium 12/30/2015 4.6  3.5 -  5.2 mmol/L Final  . Chloride 12/30/2015 102  96 - 106 mmol/L Final  . CO2 12/30/2015 22  18 - 29 mmol/L Final  . Calcium 12/30/2015 9.9  8.7 - 10.3 mg/dL Final  . Total Protein 12/30/2015 7.5  6.0 - 8.5 g/dL Final  . Albumin 12/30/2015 4.4  3.5 - 4.7 g/dL Final  . Globulin, Total 12/30/2015 3.1  1.5 - 4.5 g/dL Final  . Albumin/Globulin Ratio 12/30/2015 1.4  1.1 - 2.5 Final   Comment: **Effective January 09, 2016 the reference interval**   for A/G Ratio will be changing to:              Age                Female          Female           0 -  7 days       1.1 - 2.3       1.1 - 2.3           8 - 30 days       1.2 - 2.8       1.2 - 2.8           1 -  6 months     1.3 - 3.6       1.3 - 3.6    7 months -  5 years      1.5 - 2.6       1.5 - 2.6              > 5 years      1.2 - 2.2       1.2 - 2.2   . Bilirubin Total 12/30/2015 <0.2  0.0 - 1.2 mg/dL Final  . Alkaline Phosphatase 12/30/2015 67  39 - 117 IU/L Final  . AST 12/30/2015 22  0 - 40 IU/L Final  . ALT 12/30/2015 18  0 - 32 IU/L Final    No results found.   Assessment/Plan   ICD-9-CM ICD-10-CM   1. Cerumen impaction, left 380.4 H61.22   2. Generalized anxiety disorder 300.02 F41.1 diazepam (VALIUM) 5 MG tablet   Ear lavage  Performed successfully. TM intact nonbulging no redness. External ear canal with min abrasion and bleeding post lavage. Observation warranted. No secondary signs of infection.  F/u as scheduled. Cont current meds as ordered  [employer]. Perlie Gold  [facility] and [facility] [address] [place], [place] [address] [phone] Cell (Monday-Friday 8 AM - 5 PM) [phone] After 5 PM and follow prompts

## 2016-02-23 NOTE — Addendum Note (Signed)
Addended byMarisa Cyphers C on: 02/23/2016 03:39 PM   Modules accepted: Orders

## 2016-04-04 ENCOUNTER — Ambulatory Visit: Payer: PPO | Admitting: Internal Medicine

## 2016-04-18 ENCOUNTER — Ambulatory Visit (INDEPENDENT_AMBULATORY_CARE_PROVIDER_SITE_OTHER): Payer: PPO | Admitting: Internal Medicine

## 2016-04-18 ENCOUNTER — Encounter: Payer: Self-pay | Admitting: Internal Medicine

## 2016-04-18 ENCOUNTER — Ambulatory Visit
Admission: RE | Admit: 2016-04-18 | Discharge: 2016-04-18 | Disposition: A | Discharge status: Home / Self Care | Payer: PPO | Source: Ambulatory Visit | Attending: Internal Medicine | Admitting: Internal Medicine

## 2016-04-18 VITALS — BP 132/80 | HR 76 | Temp 98.2°F | Ht 61.0 in | Wt 168.0 lb

## 2016-04-18 DIAGNOSIS — H9312 Tinnitus, left ear: Secondary | ICD-10-CM | POA: Diagnosis not present

## 2016-04-18 DIAGNOSIS — I10 Essential (primary) hypertension: Secondary | ICD-10-CM

## 2016-04-18 DIAGNOSIS — F411 Generalized anxiety disorder: Secondary | ICD-10-CM | POA: Diagnosis not present

## 2016-04-18 DIAGNOSIS — M542 Cervicalgia: Secondary | ICD-10-CM

## 2016-04-18 DIAGNOSIS — K219 Gastro-esophageal reflux disease without esophagitis: Secondary | ICD-10-CM

## 2016-04-18 DIAGNOSIS — M50322 Other cervical disc degeneration at C5-C6 level: Secondary | ICD-10-CM | POA: Diagnosis not present

## 2016-04-18 DIAGNOSIS — K5900 Constipation, unspecified: Secondary | ICD-10-CM

## 2016-04-18 DIAGNOSIS — M50323 Other cervical disc degeneration at C6-C7 level: Secondary | ICD-10-CM | POA: Diagnosis not present

## 2016-04-18 DIAGNOSIS — M50321 Other cervical disc degeneration at C4-C5 level: Secondary | ICD-10-CM | POA: Diagnosis not present

## 2016-04-18 DIAGNOSIS — H9319 Tinnitus, unspecified ear: Secondary | ICD-10-CM | POA: Insufficient documentation

## 2016-04-18 DIAGNOSIS — R5383 Other fatigue: Secondary | ICD-10-CM | POA: Diagnosis not present

## 2016-04-18 NOTE — Progress Notes (Signed)
Patient ID: Alexandria Beck, female   DOB: 04/09/1931, 82 y.o.   MRN: [id number]    Location:  P[facility] Place of Service: OFFICE  Chief Complaint  Patient presents with  . Medical Management of Chronic Issues    3 month Follow up    HPI:  82 yo female seen today for f/u. She c/a left ear pain -->to neck with associated tinnitus. No ear pressure. No relation to head position or motion. No d/c or bleeding. She has not seen ENT in about 1 yr.  GAD - has had a severe panic attack a few mos ago and EMS called. Heart checked out. She was not taken to ED. Take effexor xr which has helped but she has frequent breakthrough sx's that are not relieved with valium. She takes diazepam BID. Sleeping better overall. She feels fatigued and has anhedonia. Denies depression  HTN - BP controlled on diovan  GERD - sx's stable on carafate and nexium. She has constipation and would like to try miralax  Hx lumbar spondylolisthesis/DDD/spinal stenosis - no c/o back pain today. Last MRI in 04/2013. She had epidural 05/18/13  Past Medical History  Diagnosis Date  . Near syncope     diagnosed as anxiety related. gets some relief with clonazepam. No formal testing done.  [name] GERD (gastroesophageal reflux disease)     has had EGD 2006-negative  . Allergy     seasonal  . Irregular heart beat     no testing, holter, etc. Takes no medication  . Hypertension      On no medication. Home readings SBP=150's  . Diverticulosis 2004  . Internal hemorrhoids   . Esophageal stricture   . Helicobacter positive gastritis 2006    Past Surgical History  Procedure Laterality Date  . Abdominal hysterectomy  1974    fibroid tumors w/ metorrhagia  . Tonsillectomy    . G4p4 - nsvd  midwife delivery for two    . Tonsillectomy      Patient Care Team: Gildardo Cranker, DO as PCP - General (Internal Medicine) Hendricks Limes, MD as Consulting Physician (Internal Medicine) Calton Dach, MD as Referring Physician  [facility])  Social History   Social History  . Marital Status: Widowed    Spouse Name: N/A  . Number of Children: 4  . Years of Education: 11   Occupational History  . stock person     retired   Social History Main Topics  . Smoking status: Never Smoker   . Smokeless tobacco: Never Used  . Alcohol Use: No  . Drug Use: No  . Sexual Activity: Not Currently   Other Topics Concern  . Not on file   Social History Narrative   Diet:      Do you drink/ eat things with caffeine? yes      Marital status:   widow                            What year were you married ?  1953      Do you live in a house, apartment,assistred living, condo, trailer, etc.)? house      Is it one or more stories? 1 story      How many persons live in your home ?2      Do you have any pets in your home ?(please list)  none      Current or past profession: [employer]  Do you exercise?  yes                            Type & how often:  walking      Do you have a living will?  no      Do you have a DNR form?     no                  If not, do you want to discuss one?       Do you have signed POA?HPOA forms?  no               If so, please bring to your        appointment                          reports that she has never smoked. She has never used smokeless tobacco. She reports that she does not drink alcohol or use illicit drugs.  Family History  Problem Relation Age of Onset  . Heart disease Mother   . Stroke Mother   . Hypertension Mother   . Cancer Father     lung cancer - smoker  . Heart disease Sister     MI  . Kidney disease Sister     end stage renal disease on HD  . Diabetes Sister   . Heart disease Brother   . Diabetes Sister   . Hypertension Sister   . Kidney disease Sister   . Cancer Sister   . Diabetes Sister   . Kidney disease Sister     HD  . Hypertension Sister   . Heart disease Sister   . Hypertension Sister   . Heart disease Sister   . Hypertension  Sister   . Hypertension Sister    Family Status  Relation Status Death Age  . Mother Deceased 67  . Father Deceased 77  . Sister Deceased   . Brother Deceased     CAD/MI  . Sister Deceased   . Sister Deceased   . Sister Alive   . Sister Alive   . Sister Alive   . Sister Alive   . Son Alive   . Daughter Alive   . Son Alive   . Son Alive      Allergies  Allergen Reactions  . Clonazepam Swelling and Other (See Comments)    Tongue thickening sensation  . Codeine Rash  . Lexapro [Escitalopram Oxalate] Other (See Comments)    Made her "feel funny"  . Aspirin Nausea And Vomiting and Other (See Comments)    Stomach upset    Medications: Patient's Medications  New Prescriptions   No medications on file  Previous Medications   DIAZEPAM (VALIUM) 5 MG TABLET    Take 1 tablet (5 mg total) by mouth every 8 (eight) hours as needed. for anxiety   ESOMEPRAZOLE (NEXIUM) 40 MG CAPSULE    Take 1 capsule (40 mg total) by mouth daily.   OVER THE COUNTER MEDICATION    Apply 1 application topically daily. "Two Old Goats - Moisturizing lotion"   PATADAY 0.2 % SOLN    INT 1 GTT IN OU D IN THE MORNING   RESTASIS 0.05 % OPHTHALMIC EMULSION    Apply 1 Dose to eye 2 (two) times daily.   SUCRALFATE (CARAFATE) 1 G TABLET    Take 1 g by mouth 3 (  three) times daily as needed.   VALSARTAN (DIOVAN) 80 MG TABLET    Take 1 tablet (80 mg total) by mouth daily.   VENLAFAXINE XR (EFFEXOR-XR) 75 MG 24 HR CAPSULE    Take 1 capsule (75 mg total) by mouth daily with breakfast.   VITAMIN C (ASCORBIC ACID) 500 MG TABLET    Take 500 mg by mouth daily.  Modified Medications   No medications on file  Discontinued Medications   SUCRALFATE (CARAFATE) 1 G TABLET    Take 1 tablet (1 g total) by mouth 3 (three) times daily.    Review of Systems  HENT: Positive for tinnitus. Negative for trouble swallowing.   Musculoskeletal: Positive for arthralgias and neck pain. Negative for back pain.  All other systems  reviewed and are negative.   Filed Vitals:   04/18/16 1031  BP: 132/80  Pulse: 76  Temp: 98.2 F (36.8 C)  TempSrc: Oral  Height: 5\' 1"  (1.549 m)  Weight: 168 lb (76.204 kg)  SpO2: 97%   Body mass index is 31.76 kg/(m^2).  Physical Exam  Constitutional: She is oriented to person, place, and time. She appears well-developed and well-nourished.  HENT:  Head:    Mouth/Throat: Oropharynx is clear and moist. No oropharyngeal exudate.  Right and Left TM appears dull but intact. No external ear canal swelling or redness. No ear d/c. No FB  Eyes: Pupils are equal, round, and reactive to light. No scleral icterus.  Neck: Neck supple. Carotid bruit is present (b/l systolic). No tracheal deviation present. No thyromegaly present.  Cardiovascular: Normal rate, regular rhythm and intact distal pulses.  Exam reveals no gallop and no friction rub.   Murmur (2/6 SEM --> carotid b/l) heard. No LE edema b/l. no calf TTP.   Pulmonary/Chest: Effort normal and breath sounds normal. No stridor. No respiratory distress. She has no wheezes. She has no rales.  Abdominal: Soft. Bowel sounds are normal. She exhibits no distension and no mass. There is no hepatomegaly. There is no tenderness. There is no rebound and no guarding.  Musculoskeletal: She exhibits edema and tenderness.  Lymphadenopathy:    She has no cervical adenopathy.  Neurological: She is alert and oriented to person, place, and time.  Skin: Skin is warm and dry. No rash noted.  Left forearm egg sized lipoma freely mobile, NT.   Psychiatric: She has a normal mood and affect. Her behavior is normal. Judgment and thought content normal.     Labs reviewed: No visits with results within 3 Month(s) from this visit. Latest known visit with results is:  Office Visit on 12/30/2015  Component Date Value Ref Range Status  . Glucose 12/30/2015 97  65 - 99 mg/dL Final  . BUN 12/30/2015 14  8 - 27 mg/dL Final  . Creatinine, Ser 12/30/2015  0.76  0.57 - 1.00 mg/dL Final  . GFR calc non Af Amer 12/30/2015 73  >59 mL/min/1.73 Final  . GFR calc Af Amer 12/30/2015 84  >59 mL/min/1.73 Final  . BUN/Creatinine Ratio 12/30/2015 18  11 - 26 Final  . Sodium 12/30/2015 145* 134 - 144 mmol/L Final  . Potassium 12/30/2015 4.6  3.5 - 5.2 mmol/L Final  . Chloride 12/30/2015 102  96 - 106 mmol/L Final  . CO2 12/30/2015 22  18 - 29 mmol/L Final  . Calcium 12/30/2015 9.9  8.7 - 10.3 mg/dL Final  . Total Protein 12/30/2015 7.5  6.0 - 8.5 g/dL Final  . Albumin 12/30/2015 4.4  3.5 -  4.7 g/dL Final  . Globulin, Total 12/30/2015 3.1  1.5 - 4.5 g/dL Final  . Albumin/Globulin Ratio 12/30/2015 1.4  1.1 - 2.5 Final   Comment: **Effective January 09, 2016 the reference interval**   for A/G Ratio will be changing to:              Age                Female          Female           0 -  7 days       1.1 - 2.3       1.1 - 2.3           8 - 30 days       1.2 - 2.8       1.2 - 2.8           1 -  6 months     1.3 - 3.6       1.3 - 3.6    7 months -  5 years      1.5 - 2.6       1.5 - 2.6              > 5 years      1.2 - 2.2       1.2 - 2.2   . Bilirubin Total 12/30/2015 <0.2  0.0 - 1.2 mg/dL Final  . Alkaline Phosphatase 12/30/2015 67  39 - 117 IU/L Final  . AST 12/30/2015 22  0 - 40 IU/L Final  . ALT 12/30/2015 18  0 - 32 IU/L Final    No results found.   Assessment/Plan   ICD-9-CM ICD-10-CM   1. Neck pain 723.1 M54.2 DG Cervical Spine Complete  2. Tinnitus, left 388.30 H93.12   3. Generalized anxiety disorder 300.02 F41.1   4. Essential hypertension 401.9 99991111 Basic Metabolic Panel     Urinalysis with Reflex Microscopic  5. Gastroesophageal reflux disease without esophagitis 530.81 K21.9   6. Constipation, unspecified constipation type 564.00 K59.00   7. Other fatigue 780.79 R53.83 CBC with Differential/Platelets     Basic Metabolic Panel     TSH     Urinalysis with Reflex Microscopic   Recommend OTC Miralax daily for constipation  Recommend  xray of neck to further investigate pain - she agrees  Follow up in 3 mos for routine visit. Will call with lab results  [facility]. Perlie Gold  [facility] and Adult Medicine [address] [place], [place] [address] [phone] Cell (Monday-Friday 8 AM - 5 PM) [phone] After 5 PM and follow prompts

## 2016-04-18 NOTE — Patient Instructions (Addendum)
Recommend OTC Miralax daily for constipation  Recommend xray of neck to further investigate pain  Follow up in 3 mos for routine visit. Will call with lab results

## 2016-04-19 LAB — CBC WITH DIFFERENTIAL/PLATELET
BASOS ABS: 0 10*3/uL (ref 0.0–0.2)
Basos: 0 %
EOS (ABSOLUTE): 0.2 10*3/uL (ref 0.0–0.4)
EOS: 2 %
HEMOGLOBIN: 12.5 g/dL (ref 11.1–15.9)
Hematocrit: 38 % (ref 34.0–46.6)
IMMATURE GRANULOCYTES: 0 %
Immature Grans (Abs): 0 10*3/uL (ref 0.0–0.1)
LYMPHS ABS: 3.5 10*3/uL — AB (ref 0.7–3.1)
LYMPHS: 51 %
MCH: 30 pg (ref 26.6–33.0)
MCHC: 32.9 g/dL (ref 31.5–35.7)
MCV: 91 fL (ref 79–97)
MONOCYTES: 6 %
Monocytes Absolute: 0.4 10*3/uL (ref 0.1–0.9)
NEUTROS PCT: 41 %
Neutrophils Absolute: 2.9 10*3/uL (ref 1.4–7.0)
Platelets: 348 10*3/uL (ref 150–379)
RBC: 4.16 x10E6/uL (ref 3.77–5.28)
RDW: 13.3 % (ref 12.3–15.4)
WBC: 7 10*3/uL (ref 3.4–10.8)

## 2016-04-19 LAB — BASIC METABOLIC PANEL
BUN/Creatinine Ratio: 14 (ref 12–28)
BUN: 10 mg/dL (ref 8–27)
CO2: 24 mmol/L (ref 18–29)
CREATININE: 0.72 mg/dL (ref 0.57–1.00)
Calcium: 9.5 mg/dL (ref 8.7–10.3)
Chloride: 104 mmol/L (ref 96–106)
GFR calc Af Amer: 90 mL/min/{1.73_m2} (ref >59)
GFR, EST NON AFRICAN AMERICAN: 78 mL/min/{1.73_m2} (ref >59)
Glucose: 94 mg/dL (ref 65–99)
Potassium: 4.1 mmol/L (ref 3.5–5.2)
Sodium: 144 mmol/L (ref 134–144)

## 2016-04-19 LAB — TSH: TSH: 1.13 u[IU]/mL (ref 0.450–4.500)

## 2016-04-20 ENCOUNTER — Other Ambulatory Visit: Payer: PPO

## 2016-04-20 DIAGNOSIS — R5383 Other fatigue: Secondary | ICD-10-CM | POA: Diagnosis not present

## 2016-04-20 DIAGNOSIS — I10 Essential (primary) hypertension: Secondary | ICD-10-CM | POA: Diagnosis not present

## 2016-04-21 LAB — URINALYSIS, ROUTINE W REFLEX MICROSCOPIC
BILIRUBIN UA: NEGATIVE
Glucose, UA: NEGATIVE
Ketones, UA: NEGATIVE
Leukocytes, UA: NEGATIVE
Nitrite, UA: NEGATIVE
Protein, UA: NEGATIVE
RBC UA: NEGATIVE
Specific Gravity, UA: 1.015 (ref 1.005–1.030)
Urobilinogen, Ur: 0.2 mg/dL (ref 0.2–1.0)
pH, UA: 7 (ref 5.0–7.5)

## 2016-04-23 ENCOUNTER — Other Ambulatory Visit: Payer: Self-pay

## 2016-04-23 DIAGNOSIS — M549 Dorsalgia, unspecified: Principal | ICD-10-CM

## 2016-04-23 DIAGNOSIS — G8929 Other chronic pain: Secondary | ICD-10-CM

## 2016-04-25 ENCOUNTER — Telehealth: Payer: Self-pay

## 2016-04-25 NOTE — Telephone Encounter (Signed)
[facility] - Neurosurgery Referral.   Called patient to let her know we faxed her referral and to expect a call from [facility] - Patient stated her Grand-Daughter already called and canceled this. I explained to patient that she called and canceled the CT but nothing was said about the Referral for Neurosurgery. She said she didn't want it and she doesn't want no surgery, I explained that it would be for a consultation and it didn't mean she would have to have surgery. She sad no, no I don't want it. I don't want to go. She said she would do what she needed to do when they call to schedule.

## 2016-04-25 NOTE — Telephone Encounter (Signed)
Patient's grand-daughter called: Patient would like to hold off on CT Scan for now. Patient will call when and if she wishes to reconsider.

## 2016-05-02 ENCOUNTER — Other Ambulatory Visit: Payer: Self-pay | Admitting: Internal Medicine

## 2016-06-05 ENCOUNTER — Ambulatory Visit (INDEPENDENT_AMBULATORY_CARE_PROVIDER_SITE_OTHER): Payer: PPO | Admitting: Nurse Practitioner

## 2016-06-05 ENCOUNTER — Encounter: Payer: Self-pay | Admitting: Nurse Practitioner

## 2016-06-05 VITALS — BP 118/76 | HR 76 | Temp 98.1°F | Resp 18 | Ht 61.0 in | Wt 170.8 lb

## 2016-06-05 DIAGNOSIS — M5416 Radiculopathy, lumbar region: Secondary | ICD-10-CM

## 2016-06-05 MED ORDER — PREGABALIN 50 MG PO CAPS: 50.0000 mg | ORAL_CAPSULE | Freq: Two times a day (BID) | ORAL | 1 refills | Status: DC

## 2016-06-05 NOTE — Progress Notes (Signed)
PCP: Gildardo Cranker, DO  Advanced Directive information Does patient have an advance directive?: No  Allergies  Allergen Reactions  . Clonazepam Swelling and Other (See Comments)    Tongue thickening sensation  . Codeine Rash  . Lexapro [Escitalopram Oxalate] Other (See Comments)    Made her "feel funny"  . Aspirin Nausea And Vomiting and Other (See Comments)    Stomach upset    Chief Complaint  Patient presents with  . Acute Visit    Tingling that starts in toes of left foot and moves up to knee; has been happening since back injection last year.      HPI: Patient is a 82 y.o. female seen in the office today tingling in toes in left leg that has been going on for 3-4 weeks.  Pt with chronic back pain, has had injection in the past and feels like they are related. Pain in back every day; Taking ibuprofen TID Had injections in the past which helped Numbness in toes which radiates into shin.  Pain in bilateral feet and legs but numbness only in left Last MRI in 2014 due to similar presentation and then back in 2005 per note in 2014.   Gabapentin somewhat improved symptoms in the past. No falls.  No trauma noted No loss of bowel or bladder  Review of Systems:  Review of Systems  Constitutional: Negative for activity change, appetite change and fatigue.  Respiratory: Negative for shortness of breath.   Cardiovascular: Negative for chest pain and leg swelling.  Gastrointestinal: Negative for constipation and diarrhea.  Musculoskeletal: Positive for arthralgias and back pain. Negative for gait problem, joint swelling and myalgias.  Skin: Negative for color change and pallor.  Neurological: Positive for numbness (and tingling to left). Negative for weakness.    Past Medical History:  Diagnosis Date  . Allergy    seasonal  . Diverticulosis 2004  . Esophageal stricture   . GERD (gastroesophageal reflux disease)    has had EGD 2006-negative  . Helicobacter positive  gastritis 2006  . Hypertension     On no medication. Home readings SBP=150's  . Internal hemorrhoids   . Irregular heart beat    no testing, holter, etc. Takes no medication  . Near syncope    diagnosed as anxiety related. gets some relief with clonazepam. No formal testing done.   Past Surgical History:  Procedure Laterality Date  . ABDOMINAL HYSTERECTOMY  1974   fibroid tumors w/ metorrhagia  . Robert.Gallant - NSVD  midwife delivery for two    . TONSILLECTOMY    . TONSILLECTOMY     Social History:   reports that she has never smoked. She has never used smokeless tobacco. She reports that she does not drink alcohol or use drugs.  Family History  Problem Relation Age of Onset  . Heart disease Mother   . Stroke Mother   . Hypertension Mother   . Cancer Father     lung cancer - smoker  . Heart disease Sister     MI  . Kidney disease Sister     end stage renal disease on HD  . Diabetes Sister   . Heart disease Brother   . Diabetes Sister   . Hypertension Sister   . Kidney disease Sister   . Cancer Sister   . Diabetes Sister   . Kidney disease Sister     HD  . Hypertension Sister   . Heart disease Sister   . Hypertension Sister   .  Heart disease Sister   . Hypertension Sister   . Hypertension Sister     Medications: Patient's Medications  New Prescriptions   No medications on file  Previous Medications   DIAZEPAM (VALIUM) 5 MG TABLET    TAKE 1 TABLET BY MOUTH EVERY 8 HOURS AS NEEDED   ESOMEPRAZOLE (NEXIUM) 40 MG CAPSULE    Take 1 capsule (40 mg total) by mouth daily.   OVER THE COUNTER MEDICATION    Apply 1 application topically daily. "Two Old Goats - Moisturizing lotion"   PATADAY 0.2 % SOLN    INT 1 GTT IN OU D IN THE MORNING   RESTASIS 0.05 % OPHTHALMIC EMULSION    Apply 1 Dose to eye 2 (two) times daily.   SUCRALFATE (CARAFATE) 1 G TABLET    Take 1 g by mouth 3 (three) times daily as needed.   VALSARTAN (DIOVAN) 80 MG TABLET    Take 1 tablet (80 mg total) by mouth  daily.   VENLAFAXINE XR (EFFEXOR-XR) 75 MG 24 HR CAPSULE    Take 1 capsule (75 mg total) by mouth daily with breakfast.   VITAMIN C (ASCORBIC ACID) 500 MG TABLET    Take 500 mg by mouth daily.  Modified Medications   No medications on file  Discontinued Medications   No medications on file     Physical Exam:  Vitals:   06/05/16 0826  BP: 118/76  Pulse: 76  Resp: 18  Temp: 98.1 F (36.7 C)  TempSrc: Oral  SpO2: 98%  Weight: 170 lb 12.8 oz (77.5 kg)  Height: 5\' 1"  (1.549 m)   Body mass index is 32.27 kg/m.  Physical Exam  Constitutional: She is oriented to person, place, and time. She appears well-developed and well-nourished. No distress.  Cardiovascular: Normal rate, regular rhythm, normal heart sounds and intact distal pulses.  Exam reveals no gallop and no friction rub.   No murmur heard. Pulmonary/Chest: Effort normal and breath sounds normal. No respiratory distress.  Musculoskeletal: Normal range of motion. She exhibits no edema or tenderness.  Neurological: She is alert and oriented to person, place, and time. She displays normal reflexes. No cranial nerve deficit. She exhibits normal muscle tone. Coordination normal.  Decreased monofilament and vibratory sensation to left foot   Skin: Skin is warm and dry. No rash noted.  Psychiatric: She has a normal mood and affect. Her behavior is normal. Judgment and thought content normal.    Labs reviewed: Basic Metabolic Panel:  Recent Labs  08/19/15 0900 12/30/15 1149 04/18/16 1144  NA 142 145* 144  K 4.1 4.6 4.1  CL 103 102 104  CO2 22 22 24   GLUCOSE 95 97 94  BUN 12 14 10   CREATININE 0.83 0.76 0.72  CALCIUM 9.5 9.9 9.5  TSH  --   --  1.130   Liver Function Tests:  Recent Labs  12/30/15 1149  AST 22  ALT 18  ALKPHOS 67  BILITOT <0.2  PROT 7.5  ALBUMIN 4.4   No results for input(s): LIPASE, AMYLASE in the last 8760 hours. No results for input(s): AMMONIA in the last 8760 hours. CBC:  Recent  Labs  04/18/16 1144  WBC 7.0  NEUTROABS 2.9  HCT 38.0  MCV 91  PLT 348   Lipid Panel: No results for input(s): CHOL, HDL, LDLCALC, TRIG, CHOLHDL, LDLDIRECT in the last 8760 hours. TSH:  Recent Labs  04/18/16 1144  TSH 1.130   A1C: Lab Results  Component Value Date  HGBA1C 6.1 04/09/2014   05/08/2013  Clinical Data: Pain.  Parasthesias in the left leg.  MRI LUMBAR SPINE WITHOUT CONTRAST  Technique:  Multiplanar and multiecho pulse sequences of the lumbar spine were obtained without intravenous contrast.  Comparison: MRI dated 07/10/2004 and lumbar radiographs dated 12/13/2003 .  Findings: Slightly low-lying but otherwise normal appearing conus tip at the inferior aspect of L2.  Normal paraspinal soft tissues.  T10-11 through L3-4:  No disc protrusions or significant bulges. There are stable tiny central annular tears at L1-2, L2-3, and L3- 4.  Slight progressive right facet joint arthritis at L3-4.  L4-5:  Slightly increased spondylolisthesis, now 8 mm due to severe bilateral facet arthritis.  No bulging of the uncovered disc.  No foraminal stenosis.  However, there is severe spinal stenosis secondary to impingement upon the spinal canal by the hypertrophied ligamenta flava and the slipped facets.  Maximum narrowing of the thecal sac is just below the disc space due to a combination of posterior element hypertrophy and prominent epidural fat.  L5-S1:  Progressive degenerative disc disease with new disc space narrowing and small broad-based endplate osteophytes without neural impingement.  No disc protrusion.  Moderate bilateral foraminal stenosis.  However, the L5 nerves appear to exit without impingement.  Minimal degenerative changes of the facet joints.  IMPRESSION:  1.  Interval worsening of spinal stenosis, spondylolisthesis, and facet arthritis at L4-5 with severe spinal stenosis and lateral recess stenosis. 2.  Progressive degenerative disc  disease at L5-S1 without neural impingement.    Assessment/Plan 1. Left lumbar radiculopathy -will follow up MR Lumbar Spine Wo Contrast for further evaluation - pregabalin (LYRICA) 50 MG capsule; Take 1 capsule (50 mg total) by mouth 2 (two) times daily.  Dispense: 60 capsule; Refill: 1 for neuropathy -to stop ibuprofen at this time, may use tylenol 325 mg 2 tabs q 6 hr PRN -may refer to IR for injection -given return precautions. Pt understands    Carlos American. Harle Battiest  [facility] [phone] 8 am - 5 pm) [phone] (after hours)

## 2016-06-05 NOTE — Patient Instructions (Addendum)
Will get MRI to evaluate back pain STOP ibuprofen  To use lyrica 50 mg twice May use tylenol 325 mg 2 tablets every 6 hours as needed pain

## 2016-06-14 ENCOUNTER — Ambulatory Visit
Admission: RE | Admit: 2016-06-14 | Discharge: 2016-06-14 | Disposition: A | Discharge status: Home / Self Care | Payer: PPO | Source: Ambulatory Visit | Attending: Nurse Practitioner | Admitting: Nurse Practitioner

## 2016-06-14 DIAGNOSIS — M5416 Radiculopathy, lumbar region: Secondary | ICD-10-CM

## 2016-06-14 DIAGNOSIS — M4806 Spinal stenosis, lumbar region: Secondary | ICD-10-CM | POA: Diagnosis not present

## 2016-06-18 ENCOUNTER — Other Ambulatory Visit: Payer: Self-pay | Admitting: Nurse Practitioner

## 2016-06-18 DIAGNOSIS — M5416 Radiculopathy, lumbar region: Secondary | ICD-10-CM

## 2016-06-20 ENCOUNTER — Encounter: Payer: Self-pay | Admitting: Internal Medicine

## 2016-06-20 ENCOUNTER — Ambulatory Visit (INDEPENDENT_AMBULATORY_CARE_PROVIDER_SITE_OTHER): Payer: PPO | Admitting: Internal Medicine

## 2016-06-20 VITALS — BP 124/84 | HR 83 | Temp 98.3°F | Ht 61.0 in | Wt 170.8 lb

## 2016-06-20 DIAGNOSIS — Z23 Encounter for immunization: Secondary | ICD-10-CM | POA: Diagnosis not present

## 2016-06-20 DIAGNOSIS — M48061 Spinal stenosis, lumbar region without neurogenic claudication: Secondary | ICD-10-CM

## 2016-06-20 DIAGNOSIS — G8929 Other chronic pain: Secondary | ICD-10-CM

## 2016-06-20 DIAGNOSIS — M62830 Muscle spasm of back: Secondary | ICD-10-CM

## 2016-06-20 DIAGNOSIS — M5416 Radiculopathy, lumbar region: Secondary | ICD-10-CM | POA: Diagnosis not present

## 2016-06-20 DIAGNOSIS — M4806 Spinal stenosis, lumbar region: Secondary | ICD-10-CM

## 2016-06-20 DIAGNOSIS — M549 Dorsalgia, unspecified: Secondary | ICD-10-CM

## 2016-06-20 MED ORDER — TRAMADOL HCL 50 MG PO TABS: 50.0000 mg | ORAL_TABLET | Freq: Four times a day (QID) | ORAL | 1 refills | Status: DC | PRN

## 2016-06-20 NOTE — Patient Instructions (Addendum)
Flu shot given today  Will call with appt to see radiology for epidural injection  Take tramadol AS NEEDED for severe pain  Continue other medications as ordered  Follow up as scheduled

## 2016-06-20 NOTE — Progress Notes (Signed)
Patient ID: Alexandria Beck, female   DOB: [birth year], 82 y.o.   MRN: [id number]    Location:  [facility] Place of Service: OFFICE  Chief Complaint  Patient presents with  . Other    patient wants to discuss MRI    HPI:  82 yo female seen today to discuss back pain. She rec'd 1 epidural 3 yrs ago into lumbar spine which helped. She did not return to Ortho at the time for f/u as she did not like the provider's bedside manner.  She c/o AM stiffness prior to getting OOB that resolves after 10-15 minutes of moving around. No falls. No loss of bowel/bladder control. She had a MRI about 1 week ago that showed worsening arthritic changes in lumbar spine and severe spinal stenosis at L4-L5, bulging discs. No compression of spinal cord.   Past Medical History:  Diagnosis Date  . Allergy    seasonal  . Diverticulosis 2004  . Esophageal stricture   . GERD (gastroesophageal reflux disease)    has had EGD 2006-negative  . Helicobacter positive gastritis 2006  . Hypertension     On no medication. Home readings SBP=150's  . Internal hemorrhoids   . Irregular heart beat    no testing, holter, etc. Takes no medication  . Near syncope    diagnosed as anxiety related. gets some relief with clonazepam. No formal testing done.    Past Surgical History:  Procedure Laterality Date  . ABDOMINAL HYSTERECTOMY  1974   fibroid tumors w/ metorrhagia  . Robert.Gallant - NSVD  midwife delivery for two    . TONSILLECTOMY    . TONSILLECTOMY      Patient Care Team: Gildardo Cranker, DO as PCP - General (Internal Medicine) Hendricks Limes, MD as Consulting Physician (Internal Medicine) Calton Dach, MD as Referring Physician [facility])  Social History   Social History  . Marital status: Widowed    Spouse name: N/A  . Number of children: 4  . Years of education: 81   Occupational History  . stock person     retired   Social History Main Topics  . Smoking status: Never Smoker  . Smokeless tobacco: Never Used   . Alcohol use No  . Drug use: No  . Sexual activity: Not Currently   Other Topics Concern  . Not on file   Social History Narrative   Diet:      Do you drink/ eat things with caffeine? yes      Marital status:   widow                            What year were you married ?  1953      Do you live in a house, apartment,assistred living, condo, trailer, etc.)? house      Is it one or more stories? 1 story      How many persons live in your home ?2      Do you have any pets in your home ?(please list)  none      Current or past profession: [employer]      Do you exercise?  yes                            Type & how often:  walking      Do you have a living will?  no  Do you have a DNR form?     no                  If not, do you want to discuss one?       Do you have signed POA?HPOA forms?  no               If so, please bring to your        appointment                          reports that she has never smoked. She has never used smokeless tobacco. She reports that she does not drink alcohol or use drugs.  Family History  Problem Relation Age of Onset  . Heart disease Mother   . Stroke Mother   . Hypertension Mother   . Cancer Father     lung cancer - smoker  . Heart disease Sister     MI  . Kidney disease Sister     end stage renal disease on HD  . Diabetes Sister   . Heart disease Brother   . Diabetes Sister   . Hypertension Sister   . Kidney disease Sister   . Cancer Sister   . Diabetes Sister   . Kidney disease Sister     HD  . Hypertension Sister   . Heart disease Sister   . Hypertension Sister   . Heart disease Sister   . Hypertension Sister   . Hypertension Sister    Family Status  Relation Status  . Mother Deceased at age 51  . Father Deceased at age 39  . Sister Deceased  . Brother Deceased   CAD/MI  . Sister Deceased  . Sister Deceased  . Sister Alive  . Sister Alive  . Sister Alive  . Sister Alive  . Son Alive  . Daughter  Alive  . Son Alive  . Son Alive  . Sister   . Brother   . Sister   . Sister   . Sister   . Sister   . Sister   . Sister   . Sister      Allergies  Allergen Reactions  . Clonazepam Swelling and Other (See Comments)    Tongue thickening sensation  . Codeine Rash  . Lexapro [Escitalopram Oxalate] Other (See Comments)    Made her "feel funny"  . Aspirin Nausea And Vomiting and Other (See Comments)    Stomach upset    Medications: Patient's Medications  New Prescriptions   No medications on file  Previous Medications   DIAZEPAM (VALIUM) 5 MG TABLET    TAKE 1 TABLET BY MOUTH EVERY 8 HOURS AS NEEDED   ESOMEPRAZOLE (NEXIUM) 40 MG CAPSULE    Take 1 capsule (40 mg total) by mouth daily.   OVER THE COUNTER MEDICATION    Apply 1 application topically daily. "Two Old Goats - Moisturizing lotion"   PATADAY 0.2 % SOLN    INT 1 GTT IN OU D IN THE MORNING   PREGABALIN (LYRICA) 50 MG CAPSULE    Take 1 capsule (50 mg total) by mouth 2 (two) times daily.   RESTASIS 0.05 % OPHTHALMIC EMULSION    Apply 1 Dose to eye 2 (two) times daily.   SUCRALFATE (CARAFATE) 1 G TABLET    Take 1 g by mouth 3 (three) times daily as needed.   VALSARTAN (DIOVAN) 80 MG TABLET    Take  1 tablet (80 mg total) by mouth daily.   VENLAFAXINE XR (EFFEXOR-XR) 75 MG 24 HR CAPSULE    Take 1 capsule (75 mg total) by mouth daily with breakfast.   VITAMIN C (ASCORBIC ACID) 500 MG TABLET    Take 500 mg by mouth daily.  Modified Medications   No medications on file  Discontinued Medications   No medications on file    Review of Systems  Vitals:   06/20/16 1336  BP: 124/84  Pulse: 83  Temp: 98.3 F (36.8 C)  TempSrc: Oral  SpO2: 96%  Weight: 170 lb 12.8 oz (77.5 kg)  Height: 5\' 1"  (1.549 m)   Body mass index is 32.27 kg/m.  Physical Exam  Constitutional: She appears well-developed and well-nourished.    Cardiovascular:  Trace LE edema b/l. No calf TTP  Musculoskeletal: She exhibits edema and tenderness.    Increased lumbar lordosis; short left leg; left pelvic inflare with left SI joint restriction; neg SLR; strength intact; gait antalgic  Neurological: She is alert.  Skin: Skin is warm and dry. No rash noted.  Psychiatric: She has a normal mood and affect. Her behavior is normal.     Labs reviewed: Office Visit on 04/18/2016  Component Date Value Ref Range Status  . WBC 04/19/2016 7.0  3.4 - 10.8 x10E3/uL Final  . RBC 04/19/2016 4.16  3.77 - 5.28 x10E6/uL Final  . Hemoglobin 04/19/2016 12.5  11.1 - 15.9 g/dL Final  . Hematocrit 04/19/2016 38.0  34.0 - 46.6 % Final  . MCV 04/19/2016 91  79 - 97 fL Final  . MCH 04/19/2016 30.0  26.6 - 33.0 pg Final  . MCHC 04/19/2016 32.9  31.5 - 35.7 g/dL Final  . RDW 04/19/2016 13.3  12.3 - 15.4 % Final  . Platelets 04/19/2016 348  150 - 379 x10E3/uL Final  . Neutrophils 04/19/2016 41  % Final  . Lymphs 04/19/2016 51  % Final  . Monocytes 04/19/2016 6  % Final  . Eos 04/19/2016 2  % Final  . Basos 04/19/2016 0  % Final  . Neutrophils Absolute 04/19/2016 2.9  1.4 - 7.0 x10E3/uL Final  . Lymphocytes Absolute 04/19/2016 3.5* 0.7 - 3.1 x10E3/uL Final  . Monocytes Absolute 04/19/2016 0.4  0.1 - 0.9 x10E3/uL Final  . EOS (ABSOLUTE) 04/19/2016 0.2  0.0 - 0.4 x10E3/uL Final  . Basophils Absolute 04/19/2016 0.0  0.0 - 0.2 x10E3/uL Final  . Immature Granulocytes 04/19/2016 0  % Final  . Immature Grans (Abs) 04/19/2016 0.0  0.0 - 0.1 x10E3/uL Final  . Glucose 04/19/2016 94  65 - 99 mg/dL Final  . BUN 04/19/2016 10  8 - 27 mg/dL Final  . Creatinine, Ser 04/19/2016 0.72  0.57 - 1.00 mg/dL Final  . GFR calc non Af Amer 04/19/2016 78  >59 mL/min/1.73 Final  . GFR calc Af Amer 04/19/2016 90  >59 mL/min/1.73 Final  . BUN/Creatinine Ratio 04/19/2016 14  12 - 28 Final  . Sodium 04/19/2016 144  134 - 144 mmol/L Final  . Potassium 04/19/2016 4.1  3.5 - 5.2 mmol/L Final  . Chloride 04/19/2016 104  96 - 106 mmol/L Final  . CO2 04/19/2016 24  18 - 29 mmol/L Final   . Calcium 04/19/2016 9.5  8.7 - 10.3 mg/dL Final  . TSH 04/19/2016 1.130  0.450 - 4.500 uIU/mL Final  . Specific Gravity, UA 04/21/2016 1.015  1.005 - 1.030 Final  . pH, UA 04/21/2016 7.0  5.0 - 7.5 Final  . Color,  UA 04/21/2016 Yellow  Yellow Final  . Appearance Ur 04/21/2016 Cloudy* Clear Final  . Leukocytes, UA 04/21/2016 Negative  Negative Final  . Protein, UA 04/21/2016 Negative  Negative/Trace Final  . Glucose, UA 04/21/2016 Negative  Negative Final  . Ketones, UA 04/21/2016 Negative  Negative Final  . RBC, UA 04/21/2016 Negative  Negative Final  . Bilirubin, UA 04/21/2016 Negative  Negative Final  . Urobilinogen, Ur 04/21/2016 0.2  0.2 - 1.0 mg/dL Final  . Nitrite, UA 04/21/2016 Negative  Negative Final  . Microscopic Examination 04/21/2016 Comment   Final    Mr Lumbar Spine Wo Contrast  Result Date: 06/14/2016 CLINICAL DATA:  Worsening low back pain. Tingling of the left lower extremity. EXAM: MRI LUMBAR SPINE WITHOUT CONTRAST TECHNIQUE: Multiplanar, multisequence MR imaging of the lumbar spine was performed. No intravenous contrast was administered. COMPARISON:  05/08/2013 FINDINGS: Segmentation:  5 lumbar type vertebral bodies. Alignment:  Anterolisthesis at L4-5 measuring 8 mm. Vertebrae:  No fracture or primary bone lesion. Conus medullaris: Extends to the upper L2 level and appears normal. Paraspinal and other soft tissues: Negative Disc levels: T12-L1: Shallow disc protrusion indents the ventral subarachnoid space but does not affect the neural structures. L1-2:  Mild disc bulge.  No stenosis. L2-3:  Minimal disc bulge.  No stenosis. L3-4: Mild disc bulge. Mild facet and ligamentous hypertrophy. Mild narrowing of the canal without visible neural compression. L4-5: Advanced bilateral facet arthropathy with 8 mm of anterolisthesis. Disc degeneration with shallow protrusion. Severe stenosis of the canal, lateral recesses and foramina right more than left. L5-S1: Endplate osteophytes  and bulging of the disc. Mild facet degeneration. No central canal stenosis. Mild foraminal narrowing bilaterally. Compared to the previous study, the findings are quite similar. The degenerative changes on the right at L4-5 have worsened slightly. IMPRESSION: L3-4: Disc bulge. Facet and ligamentous hypertrophy. Mild canal stenosis but without visible neural compression. L4-5: Advanced facet arthropathy with 8 mm of anterolisthesis. Endplate osteophytes and bulging of the disc. Severe spinal stenosis at this level, right more than left, that could cause neural compression. Foraminal stenosis right more than left. Degenerative changes on the right have worsened since the previous study. L5-S1: Disc and facet degeneration. Foraminal narrowing without definite compression of the L5 nerve roots. Some potential for L5 nerve root irritation certainly exists. Electronically Signed   By: Nelson Chimes M.D.   On: 06/14/2016 09:01     Assessment/Plan   ICD-9-CM ICD-10-CM   1. Left lumbar radiculopathy 724.4 M54.16 Ambulatory referral to Interventional Radiology  2. Encounter for immunization Z23 Z23 Flu Vaccine QUAD 36+ mos IM  3. Muscle spasm of back 724.8 M62.830 Ambulatory referral to Interventional Radiology   lumbar  4. Spinal stenosis of lumbar region 724.02 M48.06 Ambulatory referral to Interventional Radiology  5. Chronic back pain 724.5 M54.9 Ambulatory referral to Interventional Radiology   338.29 G89.29    Flu shot given today  Will call with appt to see radiology for epidural injection  Take tramadol AS NEEDED for severe pain. Side effects discussed  Continue other medications as ordered  Follow up as scheduled  Tanajah Boulter S. Perlie Gold  [facility] [address] [place], [place] [address] [phone] Cell (Monday-Friday 8 AM - 5 PM) [phone] After 5 PM and follow prompts

## 2016-06-26 ENCOUNTER — Other Ambulatory Visit: Payer: Self-pay | Admitting: Internal Medicine

## 2016-06-26 DIAGNOSIS — M62830 Muscle spasm of back: Secondary | ICD-10-CM

## 2016-06-26 DIAGNOSIS — M5416 Radiculopathy, lumbar region: Secondary | ICD-10-CM

## 2016-07-10 ENCOUNTER — Other Ambulatory Visit: Payer: PPO

## 2016-07-20 ENCOUNTER — Ambulatory Visit (INDEPENDENT_AMBULATORY_CARE_PROVIDER_SITE_OTHER): Payer: PPO | Admitting: Internal Medicine

## 2016-07-20 ENCOUNTER — Encounter: Payer: Self-pay | Admitting: Internal Medicine

## 2016-07-20 VITALS — BP 124/78 | HR 76 | Temp 97.8°F | Ht 61.0 in | Wt 171.2 lb

## 2016-07-20 DIAGNOSIS — F411 Generalized anxiety disorder: Secondary | ICD-10-CM | POA: Diagnosis not present

## 2016-07-20 DIAGNOSIS — Z79899 Other long term (current) drug therapy: Secondary | ICD-10-CM

## 2016-07-20 DIAGNOSIS — I10 Essential (primary) hypertension: Secondary | ICD-10-CM

## 2016-07-20 DIAGNOSIS — J302 Other seasonal allergic rhinitis: Secondary | ICD-10-CM

## 2016-07-20 DIAGNOSIS — M5416 Radiculopathy, lumbar region: Secondary | ICD-10-CM | POA: Diagnosis not present

## 2016-07-20 DIAGNOSIS — K219 Gastro-esophageal reflux disease without esophagitis: Secondary | ICD-10-CM

## 2016-07-20 LAB — COMPLETE METABOLIC PANEL WITH GFR
ALT: 10 U/L (ref 6–29)
AST: 20 U/L (ref 10–35)
Albumin: 4 g/dL (ref 3.6–5.1)
Alkaline Phosphatase: 53 U/L (ref 33–130)
BILIRUBIN TOTAL: 0.3 mg/dL (ref 0.2–1.2)
BUN: 10 mg/dL (ref 7–25)
CALCIUM: 9.5 mg/dL (ref 8.6–10.4)
CHLORIDE: 106 mmol/L (ref 98–110)
CO2: 25 mmol/L (ref 20–31)
CREATININE: 0.91 mg/dL — AB (ref 0.60–0.88)
GFR, EST AFRICAN AMERICAN: 67 mL/min (ref >=60)
GFR, EST NON AFRICAN AMERICAN: 59 mL/min — AB (ref >=60)
Glucose, Bld: 84 mg/dL (ref 65–99)
Potassium: 4.6 mmol/L (ref 3.5–5.3)
Sodium: 139 mmol/L (ref 135–146)
Total Protein: 6.8 g/dL (ref 6.1–8.1)

## 2016-07-20 MED ORDER — DIAZEPAM 5 MG PO TABS: 5.0000 mg | ORAL_TABLET | Freq: Three times a day (TID) | ORAL | 2 refills | Status: DC | PRN

## 2016-07-20 MED ORDER — PREGABALIN 50 MG PO CAPS: 50.0000 mg | ORAL_CAPSULE | Freq: Two times a day (BID) | ORAL | 4 refills | Status: DC

## 2016-07-20 MED ORDER — VENLAFAXINE HCL ER 75 MG PO CP24: 75.0000 mg | ORAL_CAPSULE | Freq: Every day | ORAL | 6 refills | Status: DC

## 2016-07-20 NOTE — Progress Notes (Signed)
Patient ID: ARASELY AKKERMAN, female   DOB: 10-May-1931, 82 y.o.   MRN: [id number]    Location:  [facility] Place of Service: OFFICE  Chief Complaint  Patient presents with  . Medical Management of Chronic Issues    3 month follow up    HPI:  82 yo female seen today for f/u. Back pain better but she still has left sided "heavy" sensation in AM when she 1st gets up. She is scheduled for epidural next month by IR. She is taking aloe tabs for pain which is helping. No obvious numbness/tingling. No falls. No loss of bowel/bladder control. She rec'd 1 epidural 3 yrs ago into lumbar spine which helped. She did not return to Ortho at the time for f/u as she did not like the provider's bedside manner.  She c/o AM stiffness prior to getting OOB that resolves after 10-15 minutes of moving around. MRI Lspine  In Aug 2017 showed worsening arthritic changes in lumbar spine and severe spinal stenosis at L4-L5, bulging discs. No compression of spinal cord. She takes tramadol prn pain  She has tingling and itching in left ear intermittently. Occasional pain that radiates into posterior auricular area. No d/c. No hearing loss.  GAD -  had a severe panic attack a several mos ago and EMS called. Heart checked out. She was not taken to ED. Take effexor xr which has helped but she has frequent breakthrough sx's that are not relieved with valium. She takes diazepam BID. Sleeping better overall. She feels fatigued and has anhedonia. Denies depression  HTN - BP controlled on diovan  GERD - sx's stable on carafate and nexium. She has constipation and would like to try miralax  Hx lumbar spondylolisthesis/DDD/spinal stenosis - no c/o back pain today. Last MRI in 04/2013. She had epidural 05/18/13   Past Medical History:  Diagnosis Date  . Allergy    seasonal  . Diverticulosis 2004  . Esophageal stricture   . GERD (gastroesophageal reflux disease)    has had EGD 2006-negative  . Helicobacter positive gastritis 2006  .  Hypertension     On no medication. Home readings SBP=150's  . Internal hemorrhoids   . Irregular heart beat    no testing, holter, etc. Takes no medication  . Near syncope    diagnosed as anxiety related. gets some relief with clonazepam. No formal testing done.    Past Surgical History:  Procedure Laterality Date  . ABDOMINAL HYSTERECTOMY  1974   fibroid tumors w/ metorrhagia  . Robert.Gallant - NSVD  midwife delivery for two    . TONSILLECTOMY    . TONSILLECTOMY      Patient Care Team: Gildardo Cranker, DO as PCP - General (Internal Medicine) Hendricks Limes, MD as Consulting Physician (Internal Medicine) Calton Dach, MD as Referring Physician [facility])  Social History   Social History  . Marital status: Widowed    Spouse name: N/A  . Number of children: 4  . Years of education: 8   Occupational History  . stock person     retired   Social History Main Topics  . Smoking status: Never Smoker  . Smokeless tobacco: Never Used  . Alcohol use No  . Drug use: No  . Sexual activity: Not Currently   Other Topics Concern  . Not on file   Social History Narrative   Diet:      Do you drink/ eat things with caffeine? yes      Marital status:  widow                            What year were you married ?  1953      Do you live in a house, apartment,assistred living, condo, trailer, etc.)? house      Is it one or more stories? 1 story      How many persons live in your home ?2      Do you have any pets in your home ?(please list)  none      Current or past profession: [employer]      Do you exercise?  yes                            Type & how often:  walking      Do you have a living will?  no      Do you have a DNR form?     no                  If not, do you want to discuss one?       Do you have signed POA?HPOA forms?  no               If so, please bring to your        appointment                          reports that she has never smoked. She has  never used smokeless tobacco. She reports that she does not drink alcohol or use drugs.  Family History  Problem Relation Age of Onset  . Heart disease Mother   . Stroke Mother   . Hypertension Mother   . Cancer Father     lung cancer - smoker  . Heart disease Sister     MI  . Kidney disease Sister     end stage renal disease on HD  . Diabetes Sister   . Heart disease Brother   . Diabetes Sister   . Hypertension Sister   . Kidney disease Sister   . Cancer Sister   . Diabetes Sister   . Kidney disease Sister     HD  . Hypertension Sister   . Heart disease Sister   . Hypertension Sister   . Heart disease Sister   . Hypertension Sister   . Hypertension Sister    Family Status  Relation Status  . Mother Deceased at age 73  . Father Deceased at age 42  . Sister Deceased  . Brother Deceased   CAD/MI  . Sister Deceased  . Sister Deceased  . Sister Alive  . Sister Alive  . Sister Alive  . Sister Alive  . Son Alive  . Daughter Alive  . Son Alive  . Son Alive  . Sister   . Brother   . Sister   . Sister   . Sister   . Sister   . Sister   . Sister   . Sister      Allergies  Allergen Reactions  . Clonazepam Swelling and Other (See Comments)    Tongue thickening sensation  . Codeine Rash  . Lexapro [Escitalopram Oxalate] Other (See Comments)    Made her "feel funny"  . Aspirin Nausea And Vomiting and Other (See Comments)    Stomach upset  Medications: Patient's Medications  New Prescriptions   No medications on file  Previous Medications   DIAZEPAM (VALIUM) 5 MG TABLET    TAKE 1 TABLET BY MOUTH EVERY 8 HOURS AS NEEDED   ESOMEPRAZOLE (NEXIUM) 40 MG CAPSULE    Take 1 capsule (40 mg total) by mouth daily.   OVER THE COUNTER MEDICATION    Apply 1 application topically daily. "Two Old Goats - Moisturizing lotion"   PATADAY 0.2 % SOLN    INT 1 GTT IN OU D IN THE MORNING   PREGABALIN (LYRICA) 50 MG CAPSULE    Take 1 capsule (50 mg total) by mouth 2 (two)  times daily.   RESTASIS 0.05 % OPHTHALMIC EMULSION    Apply 1 Dose to eye 2 (two) times daily.   SUCRALFATE (CARAFATE) 1 G TABLET    Take 1 g by mouth 3 (three) times daily as needed.   TRAMADOL (ULTRAM) 50 MG TABLET    Take 1 tablet (50 mg total) by mouth every 6 (six) hours as needed for moderate pain or severe pain.   VALSARTAN (DIOVAN) 80 MG TABLET    Take 1 tablet (80 mg total) by mouth daily.   VENLAFAXINE XR (EFFEXOR-XR) 75 MG 24 HR CAPSULE    Take 1 capsule (75 mg total) by mouth daily with breakfast.   VITAMIN C (ASCORBIC ACID) 500 MG TABLET    Take 500 mg by mouth daily.  Modified Medications   No medications on file  Discontinued Medications   No medications on file    Review of Systems  HENT: Positive for postnasal drip. Negative for ear discharge and ear pain.   Musculoskeletal: Positive for back pain and neck pain.  All other systems reviewed and are negative.   Vitals:   07/20/16 1022  BP: 124/78  Pulse: 76  Temp: 97.8 F (36.6 C)  TempSrc: Oral  SpO2: 98%  Weight: 171 lb 3.2 oz (77.7 kg)  Height: 5' 1"  (1.549 m)   Body mass index is 32.35 kg/m.  Physical Exam  Constitutional: She is oriented to person, place, and time. She appears well-developed and well-nourished.  HENT:  Head:    Mouth/Throat: Oropharynx is clear and moist. No oropharyngeal exudate.  Right and Left TM appears dull but intact. No external ear canal swelling or redness. No ear d/c. No FB  Eyes: Pupils are equal, round, and reactive to light. No scleral icterus.  Neck: Neck supple. Carotid bruit is present (b/l systolic). No tracheal deviation present. No thyromegaly present.  Cardiovascular: Normal rate, regular rhythm and intact distal pulses.  Exam reveals no gallop and no friction rub.   Murmur (2/6 SEM --> carotid b/l) heard. No LE edema b/l. no calf TTP.   Pulmonary/Chest: Effort normal and breath sounds normal. No stridor. No respiratory distress. She has no wheezes. She has no  rales.  Abdominal: Soft. Bowel sounds are normal. She exhibits no distension and no mass. There is no hepatomegaly. There is no tenderness. There is no rebound and no guarding.  Musculoskeletal: She exhibits edema and tenderness.       Back:  Lymphadenopathy:    She has no cervical adenopathy.  Neurological: She is alert and oriented to person, place, and time.  Skin: Skin is warm and dry. No rash noted.     Psychiatric: She has a normal mood and affect. Her behavior is normal. Judgment and thought content normal.     Labs reviewed: No visits with results within 3 Month(s)  from this visit.  Latest known visit with results is:  Office Visit on 04/18/2016  Component Date Value Ref Range Status  . WBC 04/19/2016 7.0  3.4 - 10.8 x10E3/uL Final  . RBC 04/19/2016 4.16  3.77 - 5.28 x10E6/uL Final  . Hemoglobin 04/19/2016 12.5  11.1 - 15.9 g/dL Final  . Hematocrit 04/19/2016 38.0  34.0 - 46.6 % Final  . MCV 04/19/2016 91  79 - 97 fL Final  . MCH 04/19/2016 30.0  26.6 - 33.0 pg Final  . MCHC 04/19/2016 32.9  31.5 - 35.7 g/dL Final  . RDW 04/19/2016 13.3  12.3 - 15.4 % Final  . Platelets 04/19/2016 348  150 - 379 x10E3/uL Final  . Neutrophils 04/19/2016 41  % Final  . Lymphs 04/19/2016 51  % Final  . Monocytes 04/19/2016 6  % Final  . Eos 04/19/2016 2  % Final  . Basos 04/19/2016 0  % Final  . Neutrophils Absolute 04/19/2016 2.9  1.4 - 7.0 x10E3/uL Final  . Lymphocytes Absolute 04/19/2016 3.5* 0.7 - 3.1 x10E3/uL Final  . Monocytes Absolute 04/19/2016 0.4  0.1 - 0.9 x10E3/uL Final  . EOS (ABSOLUTE) 04/19/2016 0.2  0.0 - 0.4 x10E3/uL Final  . Basophils Absolute 04/19/2016 0.0  0.0 - 0.2 x10E3/uL Final  . Immature Granulocytes 04/19/2016 0  % Final  . Immature Grans (Abs) 04/19/2016 0.0  0.0 - 0.1 x10E3/uL Final  . Glucose 04/19/2016 94  65 - 99 mg/dL Final  . BUN 04/19/2016 10  8 - 27 mg/dL Final  . Creatinine, Ser 04/19/2016 0.72  0.57 - 1.00 mg/dL Final  . GFR calc non Af Amer  04/19/2016 78  >59 mL/min/1.73 Final  . GFR calc Af Amer 04/19/2016 90  >59 mL/min/1.73 Final  . BUN/Creatinine Ratio 04/19/2016 14  12 - 28 Final  . Sodium 04/19/2016 144  134 - 144 mmol/L Final  . Potassium 04/19/2016 4.1  3.5 - 5.2 mmol/L Final  . Chloride 04/19/2016 104  96 - 106 mmol/L Final  . CO2 04/19/2016 24  18 - 29 mmol/L Final  . Calcium 04/19/2016 9.5  8.7 - 10.3 mg/dL Final  . TSH 04/19/2016 1.130  0.450 - 4.500 uIU/mL Final  . Specific Gravity, UA 04/21/2016 1.015  1.005 - 1.030 Final  . pH, UA 04/21/2016 7.0  5.0 - 7.5 Final  . Color, UA 04/21/2016 Yellow  Yellow Final  . Appearance Ur 04/21/2016 Cloudy* Clear Final  . Leukocytes, UA 04/21/2016 Negative  Negative Final  . Protein, UA 04/21/2016 Negative  Negative/Trace Final  . Glucose, UA 04/21/2016 Negative  Negative Final  . Ketones, UA 04/21/2016 Negative  Negative Final  . RBC, UA 04/21/2016 Negative  Negative Final  . Bilirubin, UA 04/21/2016 Negative  Negative Final  . Urobilinogen, Ur 04/21/2016 0.2  0.2 - 1.0 mg/dL Final  . Nitrite, UA 04/21/2016 Negative  Negative Final  . Microscopic Examination 04/21/2016 Comment   Final    No results found.   Assessment/Plan   ICD-9-CM ICD-10-CM   1. Seasonal allergic rhinitis 477.9 J30.2   2. Generalized anxiety disorder 300.02 F41.1 venlafaxine XR (EFFEXOR-XR) 75 MG 24 hr capsule     diazepam (VALIUM) 5 MG tablet  3. Left lumbar radiculopathy 724.4 M54.16 pregabalin (LYRICA) 50 MG capsule  4. Essential hypertension 401.9 I10   5. Gastroesophageal reflux disease without esophagitis 530.81 K21.9   6. High risk medication use V58.69 Z79.899 CMP with eGFR   Recommend children's claritin '5mg'$  daily for seasonal allergy. It  should help relieve left ear pressure and itching  Continue other medications as ordered  Follow up with specialists as scheduled - has appt with IR for epidural  Will call with lab results  Follow up in 4 mos for routine visit   Doctor Sheahan S.  Perlie Gold  [facility] and [facility] [address] [place], [place] [address] [phone] Cell (Monday-Friday 8 AM - 5 PM) [phone] After 5 PM and follow prompts

## 2016-07-20 NOTE — Patient Instructions (Addendum)
Recommend children's claritin 5mg  daily for seasonal allergy. It should help relieve left ear pressure and itching  Continue other medications as ordered  Follow up with specialists as scheduled  Will call with lab results  Follow up in 4 mos for routine visit

## 2016-08-14 DIAGNOSIS — M48061 Spinal stenosis, lumbar region without neurogenic claudication: Secondary | ICD-10-CM | POA: Diagnosis not present

## 2016-09-12 ENCOUNTER — Ambulatory Visit (INDEPENDENT_AMBULATORY_CARE_PROVIDER_SITE_OTHER): Payer: PPO

## 2016-09-12 VITALS — BP 144/101 | HR 83 | Temp 98.6°F | Ht 61.0 in | Wt 171.0 lb

## 2016-09-12 DIAGNOSIS — K219 Gastro-esophageal reflux disease without esophagitis: Secondary | ICD-10-CM

## 2016-09-12 DIAGNOSIS — Z23 Encounter for immunization: Secondary | ICD-10-CM

## 2016-09-12 DIAGNOSIS — Z0000 Encounter for general adult medical examination without abnormal findings: Secondary | ICD-10-CM

## 2016-09-12 MED ORDER — ESOMEPRAZOLE MAGNESIUM 40 MG PO CPDR: 40.0000 mg | DELAYED_RELEASE_CAPSULE | Freq: Every day | ORAL | 3 refills | Status: DC

## 2016-09-12 NOTE — Patient Instructions (Signed)
Alexandria Beck , Thank you for taking time to come for your Medicare Wellness Visit. I appreciate your ongoing commitment to your health goals. Please review the following plan we discussed and let me know if I can assist you in the future.   These are the goals we discussed: Goals    None      This is a list of the screening recommended for you and due dates:  Health Maintenance  Topic Date Due  . Complete foot exam   06/23/1941  . DEXA scan (bone density measurement)  06/22/1998  . Pneumonia vaccines (2 of 2 - PPSV23) 09/23/2014  . Hemoglobin A1C  10/09/2014  . Eye exam for diabetics  11/30/2016  . Tetanus Vaccine  12/28/2018  . Flu Shot  Completed  . Shingles Vaccine  Completed  Preventive Care for Adults  A healthy lifestyle and preventive care can promote health and wellness. Preventive health guidelines for adults include the following key practices.  . A routine yearly physical is a good way to check with your health care provider about your health and preventive screening. It is a chance to share any concerns and updates on your health and to receive a thorough exam.  . Visit your dentist for a routine exam and preventive care every 6 months. Brush your teeth twice a day and floss once a day. Good oral hygiene prevents tooth decay and gum disease.  . The frequency of eye exams is based on your age, health, family medical history, use  of contact lenses, and other factors. Follow your health care provider's ecommendations for frequency of eye exams.  . Eat a healthy diet. Foods like vegetables, fruits, whole grains, low-fat dairy products, and lean protein foods contain the nutrients you need without too many calories. Decrease your intake of foods high in solid fats, added sugars, and salt. Eat the right amount of calories for you. Get information about a proper diet from your health care provider, if necessary.  . Regular physical exercise is one of the most important things you  can do for your health. Most adults should get at least 150 minutes of moderate-intensity exercise (any activity that increases your heart rate and causes you to sweat) each week. In addition, most adults need muscle-strengthening exercises on 2 or more days a week.  Silver Sneakers may be a benefit available to you. To determine eligibility, you may visit the website: www.silversneakers.com or contact program at 914-002-2778 Mon-Fri between 8AM-8PM.   . Maintain a healthy weight. The body mass index (BMI) is a screening tool to identify possible weight problems. It provides an estimate of body fat based on height and weight. Your health care provider can find your BMI and can help you achieve or maintain a healthy weight.   For adults 20 years and older: ? A BMI below 18.5 is considered underweight. ? A BMI of 18.5 to 24.9 is normal. ? A BMI of 25 to 29.9 is considered overweight. ? A BMI of 30 and above is considered obese.   . Maintain normal blood lipids and cholesterol levels by exercising and minimizing your intake of saturated fat. Eat a balanced diet with plenty of fruit and vegetables. Blood tests for lipids and cholesterol should begin at age 45 and be repeated every 5 years. If your lipid or cholesterol levels are high, you are over 50, or you are at high risk for heart disease, you may need your cholesterol levels checked more frequently. Ongoing high  lipid and cholesterol levels should be treated with medicines if diet and exercise are not working.  . If you smoke, find out from your health care provider how to quit. If you do not use tobacco, please do not start.  . If you choose to drink alcohol, please do not consume more than 2 drinks per day. One drink is considered to be 12 ounces (355 mL) of beer, 5 ounces (148 mL) of wine, or 1.5 ounces (44 mL) of liquor.  . If you are 66-98 years old, ask your health care provider if you should take aspirin to prevent strokes.  . Use  sunscreen. Apply sunscreen liberally and repeatedly throughout the day. You should seek shade when your shadow is shorter than you. Protect yourself by wearing long sleeves, pants, a wide-brimmed hat, and sunglasses year round, whenever you are outdoors.  . Once a month, do a whole body skin exam, using a mirror to look at the skin on your back. Tell your health care provider of new moles, moles that have irregular borders, moles that are larger than a pencil eraser, or moles that have changed in shape or color.

## 2016-09-12 NOTE — Progress Notes (Signed)
Subjective:   Alexandria Beck is a 82 y.o. female who presents for Medicare Annual/Subsequent preventive examination.  Review of Systems:  Cardiac Risk Factors include: advanced age (>80men, >6 women);hypertension     Objective:    Vitals: BP (!) 144/101 (BP Location: Left Arm, Patient Position: Sitting, Cuff Size: Normal)   Pulse 83   Temp 98.6 F (37 C) (Oral)   Ht 5\' 1"  (1.549 m)   Wt 171 lb (77.6 kg)   SpO2 97%   BMI 32.31 kg/m   Body mass index is 32.31 kg/m.  Tobacco History  Smoking Status  . Never Smoker  Smokeless Tobacco  . Never Used     Counseling given: No   Past Medical History:  Diagnosis Date  . Allergy    seasonal  . Diverticulosis 2004  . Esophageal stricture   . GERD (gastroesophageal reflux disease)    has had EGD 2006-negative  . Helicobacter positive gastritis 2006  . Hypertension     On no medication. Home readings SBP=150's  . Internal hemorrhoids   . Irregular heart beat    no testing, holter, etc. Takes no medication  . Near syncope    diagnosed as anxiety related. gets some relief with clonazepam. No formal testing done.   Past Surgical History:  Procedure Laterality Date  . ABDOMINAL HYSTERECTOMY  1974   fibroid tumors w/ metorrhagia  . Robert.Gallant - NSVD  midwife delivery for two    . TONSILLECTOMY    . TONSILLECTOMY     Family History  Problem Relation Age of Onset  . Heart disease Mother   . Stroke Mother   . Hypertension Mother   . Cancer Father     lung cancer - smoker  . Heart disease Sister     MI  . Kidney disease Sister     end stage renal disease on HD  . Diabetes Sister   . Heart disease Brother   . Diabetes Sister   . Hypertension Sister   . Kidney disease Sister   . Cancer Sister   . Diabetes Sister   . Kidney disease Sister     HD  . Hypertension Sister   . Heart disease Sister   . Hypertension Sister   . Heart disease Sister   . Hypertension Sister   . Hypertension Sister    History  Sexual Activity   . Sexual activity: Not Currently    Outpatient Encounter Prescriptions as of 09/12/2016  Medication Sig  . diazepam (VALIUM) 5 MG tablet Take 1 tablet (5 mg total) by mouth every 8 (eight) hours as needed.  [name] esomeprazole (NEXIUM) 40 MG capsule Take 1 capsule (40 mg total) by mouth daily.  . Omega-3 Fatty Acids (OMEGA-3 FISH OIL) 300 MG CAPS Take 1 capsule by mouth 3 (three) times daily.  [name] OVER THE COUNTER MEDICATION Apply 1 application topically daily. "Two Old Goats - Moisturizing lotion"  . PATADAY 0.2 % SOLN INT 1 GTT IN OU D IN THE MORNING  . pregabalin (LYRICA) 50 MG capsule Take 1 capsule (50 mg total) by mouth 2 (two) times daily.  . RESTASIS 0.05 % ophthalmic emulsion Apply 1 Dose to eye 2 (two) times daily.  . sucralfate (CARAFATE) 1 g tablet Take 1 g by mouth 3 (three) times daily as needed.  . traMADol (ULTRAM) 50 MG tablet Take 1 tablet (50 mg total) by mouth every 6 (six) hours as needed for moderate pain or severe pain.  . valsartan (  DIOVAN) 80 MG tablet Take 1 tablet (80 mg total) by mouth daily.  [name] venlafaxine XR (EFFEXOR-XR) 75 MG 24 hr capsule Take 1 capsule (75 mg total) by mouth daily with breakfast.  . vitamin C (ASCORBIC ACID) 500 MG tablet Take 500 mg by mouth daily.  . [DISCONTINUED] esomeprazole (NEXIUM) 40 MG capsule Take 1 capsule (40 mg total) by mouth daily.  . [DISCONTINUED] esomeprazole (NEXIUM) 40 MG capsule Take 1 capsule (40 mg total) by mouth daily.   No facility-administered encounter medications on file as of 09/12/2016.     Activities of Daily Living In your present state of health, do you have any difficulty performing the following activities: 09/12/2016  Hearing? N  Vision? N  Difficulty concentrating or making decisions? Y  Walking or climbing stairs? Y  Dressing or bathing? N  Doing errands, shopping? N  Preparing Food and eating ? N  Using the Toilet? N  In the past six months, have you accidently leaked urine? N  Do you have problems  with loss of bowel control? N  Managing your Medications? N  Managing your Finances? N  Housekeeping or managing your Housekeeping? N  Some recent data might be hidden    Patient Care Team: Gildardo Cranker, DO as PCP - General (Internal Medicine) Hendricks Limes, MD as Consulting Physician (Internal Medicine) Calton Dach, MD as Referring Physician (Optometry)   Assessment:    Exercise Activities and Dietary recommendations Current Exercise Habits: Home exercise routine, Type of exercise: walking, Time (Minutes): 20, Frequency (Times/Week): 2, Weekly Exercise (Minutes/Week): 40, Intensity: Mild, Exercise limited by: None identified  Goals    . Increase water intake          Starting 1115/17, I will attempt to increase my water intake from 3 bottles to 4 bottles per day.       Fall Risk Fall Risk  09/12/2016 07/20/2016 06/20/2016 06/05/2016 04/18/2016  Falls in the past year? No No No No No   Depression Screen PHQ 2/9 Scores 09/12/2016 04/18/2016 11/22/2014  PHQ - 2 Score 0 0 2  PHQ- 9 Score - - 5    Cognitive Function MMSE - Mini Mental State Exam 09/12/2016  Orientation to time 5  Orientation to Place 5  Registration 3  Attention/ Calculation 1  Recall 3  Language- name 2 objects 2  Language- repeat 1  Language- follow 3 step command 3  Language- read & follow direction 1  Write a sentence 1  Copy design 0  Total score 25        Immunization History  Administered Date(s) Administered  . Influenza Split 07/30/2011  . Influenza,inj,Quad PF,36+ Mos 07/13/2014, 06/20/2016  . Pneumococcal Conjugate-13 09/23/2013  . Pneumococcal Polysaccharide-23 09/12/2016  . Zoster 09/29/2015   Screening Tests Health Maintenance  Topic Date Due  . FOOT EXAM  06/23/1941  . DEXA SCAN  06/22/1998  . PNA vac Low Risk Adult (2 of 2 - PPSV23) 09/23/2014  . HEMOGLOBIN A1C  10/09/2014  . OPHTHALMOLOGY EXAM  11/30/2016  . TETANUS/TDAP  12/28/2018  . INFLUENZA VACCINE  Completed    . ZOSTAVAX  Completed      Plan:    I have personally reviewed and addressed the Medicare Annual Wellness questionnaire and have noted the following in the patient's chart:  A. Medical and social history B. Use of alcohol, tobacco or illicit drugs  C. Current medications and supplements D. Functional ability and status E.  Nutritional status F.  Physical activity  G. Advance directives H. List of other physicians I.  Hospitalizations, surgeries, and ER visits in previous 12 months J.  [place] to include hearing, vision, cognitive, depression L. Referrals and appointments - none  In addition, I have reviewed and discussed with patient certain preventive protocols, quality metrics, and best practice recommendations. A written personalized care plan for preventive services as well as general preventive health recommendations were provided to patient.  See attached scanned questionnaire for additional information.   Signed,   Allyn Kenner, LPN Health Advisor  I have reviewed the health advisor's note and was available for consultation. I agree with documentation and plan.   Dahir Ayer S. Perlie Gold  [facility] and Adult Medicine [address] [place], [place] [address] [phone] Cell (Monday-Friday 8 AM - 5 PM) [phone] After 5 PM and follow prompts

## 2016-09-12 NOTE — Progress Notes (Signed)
Quick Notes   Health Maintenance:   Pn23  Given; due for Foot exam, Dexa and HGBA1C   Abnormal Screen:  None; Passed Clock test-MMSE- 25/30    Patient Concerns:   None   Nurse Concerns:   None

## 2016-09-14 ENCOUNTER — Telehealth: Payer: Self-pay | Admitting: Internal Medicine

## 2016-09-14 NOTE — Telephone Encounter (Signed)
I left a message asking the pt to call me at [phone] about medication assistance. VDM (DD)

## 2016-11-21 ENCOUNTER — Ambulatory Visit: Payer: PPO | Admitting: Internal Medicine

## 2016-11-28 ENCOUNTER — Ambulatory Visit (INDEPENDENT_AMBULATORY_CARE_PROVIDER_SITE_OTHER): Payer: PPO | Admitting: Internal Medicine

## 2016-11-28 ENCOUNTER — Encounter: Payer: Self-pay | Admitting: Internal Medicine

## 2016-11-28 VITALS — BP 122/84 | HR 88 | Temp 98.2°F | Ht 61.0 in | Wt 176.0 lb

## 2016-11-28 DIAGNOSIS — I10 Essential (primary) hypertension: Secondary | ICD-10-CM

## 2016-11-28 DIAGNOSIS — I8390 Asymptomatic varicose veins of unspecified lower extremity: Secondary | ICD-10-CM

## 2016-11-28 DIAGNOSIS — K219 Gastro-esophageal reflux disease without esophagitis: Secondary | ICD-10-CM

## 2016-11-28 DIAGNOSIS — M5416 Radiculopathy, lumbar region: Secondary | ICD-10-CM | POA: Diagnosis not present

## 2016-11-28 DIAGNOSIS — M5030 Other cervical disc degeneration, unspecified cervical region: Secondary | ICD-10-CM | POA: Diagnosis not present

## 2016-11-28 DIAGNOSIS — M79604 Pain in right leg: Secondary | ICD-10-CM | POA: Diagnosis not present

## 2016-11-28 DIAGNOSIS — Z79899 Other long term (current) drug therapy: Secondary | ICD-10-CM | POA: Diagnosis not present

## 2016-11-28 DIAGNOSIS — F411 Generalized anxiety disorder: Secondary | ICD-10-CM | POA: Diagnosis not present

## 2016-11-28 LAB — BASIC METABOLIC PANEL WITH GFR
BUN: 20 mg/dL (ref 7–25)
CALCIUM: 9.2 mg/dL (ref 8.6–10.4)
CO2: 22 mmol/L (ref 20–31)
Chloride: 111 mmol/L — ABNORMAL HIGH (ref 98–110)
Creat: 0.85 mg/dL (ref 0.60–0.88)
GFR, EST AFRICAN AMERICAN: 73 mL/min (ref >=60)
GFR, EST NON AFRICAN AMERICAN: 64 mL/min (ref >=60)
GLUCOSE: 101 mg/dL — AB (ref 65–99)
Potassium: 4.5 mmol/L (ref 3.5–5.3)
Sodium: 145 mmol/L (ref 135–146)

## 2016-11-28 LAB — ALT: ALT: 14 U/L (ref 6–29)

## 2016-11-28 MED ORDER — TRAMADOL HCL 50 MG PO TABS: 50.0000 mg | ORAL_TABLET | Freq: Four times a day (QID) | ORAL | 3 refills | Status: DC | PRN

## 2016-11-28 MED ORDER — VENLAFAXINE HCL ER 150 MG PO CP24: 150.0000 mg | ORAL_CAPSULE | Freq: Every day | ORAL | 6 refills | Status: DC

## 2016-11-28 MED ORDER — VALSARTAN 80 MG PO TABS: 80.0000 mg | ORAL_TABLET | Freq: Every day | ORAL | 3 refills | Status: DC

## 2016-11-28 NOTE — Progress Notes (Signed)
Patient ID: Alexandria Beck, female   DOB: 08-16-1931, 83 y.o.   MRN: [id number]    Location:  [facility] Place of Service: OFFICE  Chief Complaint  Patient presents with  . Medical Management of Chronic Issues    4 months routine visit    HPI:  83 yo female seen today for f/u. She states she has AM chest pain/tightness x several weeks that resolves with ambulation and belching. She has occasional panic attack when she feels alone. She has left ear pain and a "funny sound" occasionally. She has been taking advil instead of tylenol due to c/a ADRs  GAD -  had a severe panic attack a several mos ago and EMS called. Heart checked out. She was not taken to ED. Take effexor xr which has helped but she has frequent breakthrough sx's that are not relieved with valium. She takes diazepam BID. Sleeping better overall. She feels fatigued and has anhedonia. Denies depression  HTN - BP controlled on diovan  GERD - sx's stable on carafate and nexium. She has constipation which is better on miralax  Hx lumbar spondylolisthesis/DDD/spinal stenosis - no c/o back pain today. Last MRI in 04/2013.  MRI L spine in Aug 2017 showed worsening arthritic changes in lumbar spine and severe spinal stenosis at L4-L5, bulging discs. No compression of spinal cord. She had epidural 05/18/13. IR did not feel she needed epidural in Oct 2017 when she saw them. CT C spine in 2013 revealed mild multilevel DDD; xray C spine in June 2017 revealed loss of C spine curvature, diffuse DDD from C4-C7 with some foraminall narrowing at C5-6 and C6-7.    Past Medical History:  Diagnosis Date  . Allergy    seasonal  . Diverticulosis 2004  . Esophageal stricture   . GERD (gastroesophageal reflux disease)    has had EGD 2006-negative  . Helicobacter positive gastritis 2006  . Hypertension     On no medication. Home readings SBP=150's  . Internal hemorrhoids   . Irregular heart beat    no testing, holter, etc. Takes no medication  . Near  syncope    diagnosed as anxiety related. gets some relief with clonazepam. No formal testing done.    Past Surgical History:  Procedure Laterality Date  . ABDOMINAL HYSTERECTOMY  1974   fibroid tumors w/ metorrhagia  . Robert.Gallant - NSVD  midwife delivery for two    . TONSILLECTOMY    . TONSILLECTOMY      Patient Care Team: Gildardo Cranker, DO as PCP - General (Internal Medicine) Hendricks Limes, MD as Consulting Physician (Internal Medicine) Calton Dach, MD as Referring Physician [facility])  Social History   Social History  . Marital status: Widowed    Spouse name: N/A  . Number of children: 4  . Years of education: 13   Occupational History  . stock person     retired   Social History Main Topics  . Smoking status: Never Smoker  . Smokeless tobacco: Never Used  . Alcohol use No  . Drug use: No  . Sexual activity: Not Currently   Other Topics Concern  . Not on file   Social History Narrative   Diet:      Do you drink/ eat things with caffeine? yes      Marital status:   widow  What year were you married ?  1953      Do you live in a house, apartment,assistred living, condo, trailer, etc.)? house      Is it one or more stories? 1 story      How many persons live in your home ?2      Do you have any pets in your home ?(please list)  none      Current or past profession: [employer]      Do you exercise?  yes                            Type & how often:  walking      Do you have a living will?  no      Do you have a DNR form?     no                  If not, do you want to discuss one?       Do you have signed POA?HPOA forms?  no               If so, please bring to your        appointment                          reports that she has never smoked. She has never used smokeless tobacco. She reports that she does not drink alcohol or use drugs.  Family History  Problem Relation Age of Onset  . Heart disease Mother   .  Stroke Mother   . Hypertension Mother   . Cancer Father     lung cancer - smoker  . Heart disease Sister     MI  . Kidney disease Sister     end stage renal disease on HD  . Diabetes Sister   . Heart disease Brother   . Diabetes Sister   . Hypertension Sister   . Kidney disease Sister   . Cancer Sister   . Diabetes Sister   . Kidney disease Sister     HD  . Hypertension Sister   . Heart disease Sister   . Hypertension Sister   . Heart disease Sister   . Hypertension Sister   . Hypertension Sister    Family Status  Relation Status  . Mother Deceased at age 43  . Father Deceased at age 31  . Sister Deceased  . Brother Deceased   CAD/MI  . Sister Deceased  . Sister Deceased  . Sister Alive  . Sister Alive  . Sister Alive  . Sister Alive  . Son Alive  . Daughter Alive  . Son Alive  . Son Alive  . Sister   . Brother   . Sister   . Sister   . Sister   . Sister   . Sister   . Sister   . Sister      Allergies  Allergen Reactions  . Clonazepam Swelling and Other (See Comments)    Tongue thickening sensation  . Codeine Rash  . Lexapro [Escitalopram Oxalate] Other (See Comments)    Made her "feel funny"  . Aspirin Nausea And Vomiting and Other (See Comments)    Stomach upset    Medications: Patient's Medications  New Prescriptions   No medications on file  Previous Medications   DIAZEPAM (VALIUM) 5 MG TABLET    Take  1 tablet (5 mg total) by mouth every 8 (eight) hours as needed.   ESOMEPRAZOLE (NEXIUM) 40 MG CAPSULE    Take 1 capsule (40 mg total) by mouth daily.   OVER THE COUNTER MEDICATION    Apply 1 application topically daily. "Two Old Goats - Moisturizing lotion"   PATADAY 0.2 % SOLN    INT 1 GTT IN OU D IN THE MORNING   PREGABALIN (LYRICA) 50 MG CAPSULE    Take 1 capsule (50 mg total) by mouth 2 (two) times daily.   RESTASIS 0.05 % OPHTHALMIC EMULSION    Apply 1 Dose to eye 2 (two) times daily.   SUCRALFATE (CARAFATE) 1 G TABLET    Take 1 g by  mouth 3 (three) times daily as needed.   TRAMADOL (ULTRAM) 50 MG TABLET    Take 1 tablet (50 mg total) by mouth every 6 (six) hours as needed for moderate pain or severe pain.   VALSARTAN (DIOVAN) 80 MG TABLET    Take 1 tablet (80 mg total) by mouth daily.   VENLAFAXINE XR (EFFEXOR-XR) 75 MG 24 HR CAPSULE    Take 1 capsule (75 mg total) by mouth daily with breakfast.   VITAMIN C (ASCORBIC ACID) 500 MG TABLET    Take 500 mg by mouth daily.  Modified Medications   No medications on file  Discontinued Medications   OMEGA-3 FATTY ACIDS (OMEGA-3 FISH OIL) 300 MG CAPS    Take 1 capsule by mouth 3 (three) times daily.    Review of Systems  HENT: Negative for ear discharge, ear pain and postnasal drip.   Musculoskeletal: Positive for arthralgias, back pain and neck pain.  All other systems reviewed and are negative.   Vitals:   11/28/16 0855  BP: 122/84  Pulse: 88  Temp: 98.2 F (36.8 C)  TempSrc: Oral  SpO2: 98%  Weight: 176 lb (79.8 kg)  Height: '5\' 1"'$  (1.549 m)   Body mass index is 33.25 kg/m.  Physical Exam  Constitutional: She is oriented to person, place, and time. She appears well-developed and well-nourished.  HENT:  Head:    Mouth/Throat: Oropharynx is clear and moist. No oropharyngeal exudate.  Right and Left TM appears dull but intact. No external ear canal swelling or redness. No ear d/c. No FB  Eyes: Pupils are equal, round, and reactive to light. No scleral icterus.  Neck: Neck supple. Carotid bruit is present (b/l systolic). No tracheal deviation present. No thyromegaly present.  Cardiovascular: Normal rate, regular rhythm and intact distal pulses.  Exam reveals no gallop and no friction rub.   Murmur (2/6 SEM --> carotid b/l) heard. Right lateral mid-distal leg palpable TTP cord; neg Homan's sign. No calf TTP. No LE edema b/l.  Pulmonary/Chest: Effort normal and breath sounds normal. No stridor. No respiratory distress. She has no wheezes. She has no rales.    Abdominal: Soft. Bowel sounds are normal. She exhibits no distension and no mass. There is no hepatomegaly. There is no tenderness. There is no rebound and no guarding.  Musculoskeletal: She exhibits edema and tenderness.       Back:  Lymphadenopathy:    She has no cervical adenopathy.  Neurological: She is alert and oriented to person, place, and time.  Skin: Skin is warm and dry. No rash noted.     Psychiatric: She has a normal mood and affect. Her behavior is normal. Judgment and thought content normal.     Labs reviewed: No visits with results within  3 Month(s) from this visit.  Latest known visit with results is:  Office Visit on 07/20/2016  Component Date Value Ref Range Status  . Sodium 07/20/2016 139  135 - 146 mmol/L Final  . Potassium 07/20/2016 4.6  3.5 - 5.3 mmol/L Final  . Chloride 07/20/2016 106  98 - 110 mmol/L Final  . CO2 07/20/2016 25  20 - 31 mmol/L Final  . Glucose, Bld 07/20/2016 84  65 - 99 mg/dL Final  . BUN 07/20/2016 10  7 - 25 mg/dL Final  . Creat 07/20/2016 0.91* 0.60 - 0.88 mg/dL Final   Comment:   For patients > or = 81 years of age: The upper reference limit for Creatinine is approximately 13% higher for people identified as African-American.     . Total Bilirubin 07/20/2016 0.3  0.2 - 1.2 mg/dL Final  . Alkaline Phosphatase 07/20/2016 53  33 - 130 U/L Final  . AST 07/20/2016 20  10 - 35 U/L Final  . ALT 07/20/2016 10  6 - 29 U/L Final  . Total Protein 07/20/2016 6.8  6.1 - 8.1 g/dL Final  . Albumin 07/20/2016 4.0  3.6 - 5.1 g/dL Final  . Calcium 07/20/2016 9.5  8.6 - 10.4 mg/dL Final  . GFR, Est African American 07/20/2016 67  >=60 mL/min Final  . GFR, Est Non African American 07/20/2016 59* >=60 mL/min Final    No results found.   Assessment/Plan   ICD-9-CM ICD-10-CM   1. Gastroesophageal reflux disease without esophagitis 530.81 K21.9   2. Generalized anxiety disorder 300.02 F41.1 venlafaxine XR (EFFEXOR-XR) 150 MG 24 hr capsule  3.  DDD (degenerative disc disease), cervical 722.4 M50.30 traMADol (ULTRAM) 50 MG tablet  4. Left lumbar radiculopathy 724.4 M54.16 traMADol (ULTRAM) 50 MG tablet  5. Varicose vein of leg 454.9 I83.90 VAS [place] LOWER EXTREMITY VENOUS (DVT)  6. Pain of right lower extremity 729.5 M79.604 VAS [place] LOWER EXTREMITY VENOUS (DVT)   r/o DVT  7. Essential hypertension 401.9 I10 valsartan (DIOVAN) 80 MG tablet     BMP with eGFR  8. High risk medication use V58.69 Z79.899 BMP with eGFR     ALT   Avoid taking advil/aleve due to acid reflux  Continue other medications as ordered  Follow up with specialists as scheduled  Will call with lab results  Follow up in 4 mos for routine visit.  [place] White[place] S. Perlie Gold  [facility] [address] [place], [place] [address] [phone] Cell (Monday-Friday 8 AM - 5 PM) [phone] After 5 PM and follow prompts

## 2016-11-28 NOTE — Patient Instructions (Signed)
Avoid taking advil/aleve due to acid reflux  Continue other medications as ordered  Follow up with specialists as scheduled  Will call with lab results  Follow up in 4 mos for routine visit.

## 2016-12-04 ENCOUNTER — Inpatient Hospital Stay (HOSPITAL_COMMUNITY): Admission: RE | Admit: 2016-12-04 | Payer: PPO | Source: Ambulatory Visit

## 2016-12-05 ENCOUNTER — Ambulatory Visit (HOSPITAL_COMMUNITY)
Admission: RE | Admit: 2016-12-05 | Discharge: 2016-12-05 | Disposition: A | Discharge status: Home / Self Care | Payer: PPO | Source: Ambulatory Visit | Attending: Vascular Surgery | Admitting: Vascular Surgery

## 2016-12-05 DIAGNOSIS — I8390 Asymptomatic varicose veins of unspecified lower extremity: Secondary | ICD-10-CM | POA: Insufficient documentation

## 2016-12-05 DIAGNOSIS — M79604 Pain in right leg: Secondary | ICD-10-CM | POA: Insufficient documentation

## 2017-01-01 ENCOUNTER — Other Ambulatory Visit: Payer: Self-pay | Admitting: Internal Medicine

## 2017-01-01 DIAGNOSIS — F411 Generalized anxiety disorder: Secondary | ICD-10-CM

## 2017-01-12 ENCOUNTER — Emergency Department (HOSPITAL_COMMUNITY)
Admission: EM | Admit: 2017-01-12 | Discharge: 2017-01-12 | Disposition: A | Discharge status: Home / Self Care | Payer: PPO | Attending: Emergency Medicine | Admitting: Emergency Medicine

## 2017-01-12 ENCOUNTER — Emergency Department (HOSPITAL_COMMUNITY): Payer: PPO

## 2017-01-12 ENCOUNTER — Encounter (HOSPITAL_COMMUNITY): Payer: Self-pay

## 2017-01-12 DIAGNOSIS — I10 Essential (primary) hypertension: Secondary | ICD-10-CM | POA: Insufficient documentation

## 2017-01-12 DIAGNOSIS — R079 Chest pain, unspecified: Secondary | ICD-10-CM | POA: Diagnosis not present

## 2017-01-12 DIAGNOSIS — F0390 Unspecified dementia without behavioral disturbance: Secondary | ICD-10-CM | POA: Diagnosis not present

## 2017-01-12 DIAGNOSIS — R030 Elevated blood-pressure reading, without diagnosis of hypertension: Secondary | ICD-10-CM | POA: Diagnosis not present

## 2017-01-12 LAB — BASIC METABOLIC PANEL
Anion gap: 12 (ref 5–15)
BUN: 8 mg/dL (ref 6–20)
CALCIUM: 9 mg/dL (ref 8.9–10.3)
CO2: 23 mmol/L (ref 22–32)
CREATININE: 0.8 mg/dL (ref 0.44–1.00)
Chloride: 105 mmol/L (ref 101–111)
GFR calc Af Amer: >60 mL/min (ref >60)
GFR calc non Af Amer: >60 mL/min (ref >60)
GLUCOSE: 102 mg/dL — AB (ref 65–99)
Potassium: 3.5 mmol/L (ref 3.5–5.1)
SODIUM: 140 mmol/L (ref 135–145)

## 2017-01-12 LAB — CBC
HCT: 31.7 % — ABNORMAL LOW (ref 36.0–46.0)
Hemoglobin: 10.4 g/dL — ABNORMAL LOW (ref 12.0–15.0)
MCH: 27.8 pg (ref 26.0–34.0)
MCHC: 32.8 g/dL (ref 30.0–36.0)
MCV: 84.8 fL (ref 78.0–100.0)
PLATELETS: 284 10*3/uL (ref 150–400)
RBC: 3.74 MIL/uL — ABNORMAL LOW (ref 3.87–5.11)
RDW: 14.9 % (ref 11.5–15.5)
WBC: 5.8 10*3/uL (ref 4.0–10.5)

## 2017-01-12 LAB — I-STAT TROPONIN, ED: TROPONIN I, POC: 0 ng/mL (ref 0.00–0.08)

## 2017-01-12 NOTE — Discharge Instructions (Signed)
You are slightly anemic. However, the rest of your tests were good. Follow-up your primary care doctor.

## 2017-01-12 NOTE — ED Triage Notes (Signed)
Pt comes via [facility], woke up feeling like her heart was pounding, no pain, no SOB, no n/v.

## 2017-01-12 NOTE — ED Provider Notes (Signed)
[facility] DEPT Provider Note   CSN: [id number] Arrival date & time: 01/12/17  0703     History   Chief Complaint Chief Complaint  Patient presents with  . Tachycardia    HPI Alexandria Beck is a 83 y.o. female.  Level V caveat for mild dementia. Patient woke up the middle of the night with a thumping sensation in her upper chest along with difficulty swallowing and some discomfort in her left lateral neck. Symptoms have appeared to have abated. No distinct crushing substernal chest pain, dyspnea, diaphoresis, nausea. Nothing makes symptoms better or worse. Severity of symptoms is mild.      Past Medical History:  Diagnosis Date  . Allergy    seasonal  . Diverticulosis 2004  . Esophageal stricture   . GERD (gastroesophageal reflux disease)    has had EGD 2006-negative  . Helicobacter positive gastritis 2006  . Hypertension     On no medication. Home readings SBP=150's  . Internal hemorrhoids   . Irregular heart beat    no testing, holter, etc. Takes no medication  . Near syncope    diagnosed as anxiety related. gets some relief with clonazepam. No formal testing done.    Patient Active Problem List   Diagnosis Date Noted  . Left lumbar radiculopathy 06/05/2016  . Generalized anxiety disorder 04/18/2016  . Neck pain 04/18/2016  . Tinnitus 04/18/2016  . Varicose veins of both lower extremities 03/11/2015  . Pain in the chest   . Chest pain 12/21/2014  . Insomnia 07/08/2014  . PVC's (premature ventricular contractions) 05/20/2014  . Murmur 05/20/2014  . Dysphagia 11/12/2013  . Routine health maintenance 10/14/2013  . Other and unspecified hyperlipidemia 10/13/2013  . Chronic back pain 10/13/2013  . 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 Paresthesia of lower extremity 01/08/2013  . Paresthesia of hand 06/18/2012  . Urticaria 08/28/2011  . Anxiety 08/19/2011  . Near syncope   . GERD (gastroesophageal reflux  disease)   . Hypertension     Past Surgical History:  Procedure Laterality Date  . ABDOMINAL HYSTERECTOMY  1974   fibroid tumors w/ metorrhagia  . Robert.Gallant - NSVD  midwife delivery for two    . TONSILLECTOMY    . TONSILLECTOMY      OB History    No data available       Home Medications    Prior to Admission medications   Medication Sig Start Date End Date Taking? Authorizing Provider  diazepam (VALIUM) 5 MG tablet TAKE 1 TABLET BY MOUTH EVERY 8 HOURS AS NEEDED 01/02/17  Yes Gildardo Cranker, DO  esomeprazole (NEXIUM) 40 MG capsule Take 1 capsule (40 mg total) by mouth daily. 09/12/16  Yes Monica Carter, DO  OVER THE COUNTER MEDICATION Apply 1 application topically daily. "Two Old Goats - Moisturizing lotion"   Yes Historical Provider, MD  PATADAY 0.2 % SOLN 1 drop in right eye every morning 07/19/15  Yes Historical Provider, MD  RESTASIS 0.05 % ophthalmic emulsion Place 1 Dose into both eyes 2 (two) times daily.  12/20/14  Yes Historical Provider, MD  sucralfate (CARAFATE) 1 g tablet Take 1 g by mouth 3 (three) times daily as needed.   Yes Historical Provider, MD  traMADol (ULTRAM) 50 MG tablet Take 1 tablet (50 mg total) by mouth every 6 (six) hours as needed for moderate pain or severe pain. 11/28/16  Yes Monica Carter, DO  venlafaxine XR (EFFEXOR-XR) 150 MG 24 hr capsule Take  1 capsule (150 mg total) by mouth daily with breakfast. 11/28/16  Yes Gildardo Cranker, DO  vitamin C (ASCORBIC ACID) 500 MG tablet Take 500 mg by mouth daily.   Yes Historical Provider, MD  pregabalin (LYRICA) 50 MG capsule Take 1 capsule (50 mg total) by mouth 2 (two) times daily. Patient not taking: Reported on 01/12/2017 07/20/16   Gildardo Cranker, DO  valsartan (DIOVAN) 80 MG tablet Take 1 tablet (80 mg total) by mouth daily. Patient not taking: Reported on 01/12/2017 11/28/16   Gildardo Cranker, DO    Family History Family History  Problem Relation Age of Onset  . Heart disease Mother   . Stroke Mother   .  Hypertension Mother   . Cancer Father     lung cancer - smoker  . Heart disease Sister     MI  . Kidney disease Sister     end stage renal disease on HD  . Diabetes Sister   . Heart disease Brother   . Diabetes Sister   . Hypertension Sister   . Kidney disease Sister   . Cancer Sister   . Diabetes Sister   . Kidney disease Sister     HD  . Hypertension Sister   . Heart disease Sister   . Hypertension Sister   . Heart disease Sister   . Hypertension Sister   . Hypertension Sister     Social History Social History  Substance Use Topics  . Smoking status: Never Smoker  . Smokeless tobacco: Never Used  . Alcohol use No     Allergies   Clonazepam; Codeine; Lexapro [escitalopram oxalate]; and Aspirin   Review of Systems Review of Systems  Reason unable to perform ROS: mild dementia.     Physical Exam Updated Vital Signs BP (!) 110/59 (BP Location: Right Arm)   Pulse 71   Resp 20   SpO2 100%   Physical Exam  Constitutional: She appears well-developed and well-nourished.  HENT:  Head: Normocephalic and atraumatic.  Eyes: Conjunctivae are normal.  Neck: Neck supple.  Cardiovascular: Normal rate and regular rhythm.   Pulmonary/Chest: Effort normal and breath sounds normal.  Abdominal: Soft. Bowel sounds are normal.  Musculoskeletal: Normal range of motion.  Neurological: She is alert.  Skin: Skin is warm and dry.  Psychiatric: She has a normal mood and affect. Her behavior is normal.  Nursing note and vitals reviewed.    ED Treatments / Results  Labs (all labs ordered are listed, but only abnormal results are displayed) Labs Reviewed  BASIC METABOLIC PANEL - Abnormal; Notable for the following:       Result Value   Glucose, Bld 102 (*)    All other components within normal limits  CBC - Abnormal; Notable for the following:    RBC 3.74 (*)    Hemoglobin 10.4 (*)    HCT 31.7 (*)    All other components within normal limits  I-STAT TROPOININ, ED     EKG  EKG Interpretation  Date/Time:  Saturday January 12 2017 07:15:04 EDT Ventricular Rate:  72 PR Interval:    QRS Duration: 93 QT Interval:  383 QTC Calculation: 420 R Axis:   58 Text Interpretation:  Sinus rhythm Probable left atrial enlargement Anteroseptal infarct, old Confirmed by Keyontae Huckeby  MD, Lakeisa Heninger ([id number]) on 01/12/2017 7:43:03 AM       Radiology Dg Chest 2 View  Result Date: 01/12/2017 CLINICAL DATA:  Chest pain/thumping. EXAM: CHEST  2 VIEW COMPARISON:  December 21, 2014  FINDINGS: The heart size and mediastinal contours are within normal limits. Both lungs are clear. The visualized skeletal structures are unremarkable. IMPRESSION: No active cardiopulmonary disease. Electronically Signed   By: Dorise Bullion III M.D   On: 01/12/2017 07:51    Procedures Procedures (including critical care time)  Medications Ordered in ED Medications - No data to display   Initial Impression / Assessment and Plan / ED Course  I have reviewed the triage vital signs and the nursing notes.  Pertinent labs & imaging results that were available during my care of the patient were reviewed by me and considered in my medical decision making (see chart for details).     Patient appears in no acute distress. She states she is feeling better. Screening tests including EKG, chest x-ray, labs, troponin all showed no acute findings. Her hemoglobin has dropped a small amount. This was discussed with the patient and her daughter.  Final Clinical Impressions(s) / ED Diagnoses   Final diagnoses:  Chest pain, unspecified type    New Prescriptions Discharge Medication List as of 01/12/2017 11:29 AM       Nat Christen, MD 01/12/17 1239

## 2017-01-12 NOTE — ED Notes (Signed)
Patient transported to X-ray 

## 2017-02-23 ENCOUNTER — Other Ambulatory Visit: Payer: Self-pay | Admitting: Internal Medicine

## 2017-02-23 DIAGNOSIS — F411 Generalized anxiety disorder: Secondary | ICD-10-CM

## 2017-03-20 LAB — GLUCOSE, POCT (MANUAL RESULT ENTRY): POC GLUCOSE: 94 mg/dL (ref 70–99)

## 2017-03-29 ENCOUNTER — Ambulatory Visit: Payer: PPO | Admitting: Internal Medicine

## 2017-03-29 ENCOUNTER — Telehealth: Payer: Self-pay | Admitting: *Deleted

## 2017-03-29 DIAGNOSIS — M5030 Other cervical disc degeneration, unspecified cervical region: Secondary | ICD-10-CM

## 2017-03-29 DIAGNOSIS — M5416 Radiculopathy, lumbar region: Secondary | ICD-10-CM

## 2017-03-29 LAB — HM DIABETES EYE EXAM

## 2017-03-29 NOTE — Telephone Encounter (Signed)
Ok  For tramadol RF

## 2017-03-29 NOTE — Telephone Encounter (Signed)
Patient called and stated that she knows she missed her appointment today but rescheduled for 04/10/17. Wants a refill on her Tramadol for her pain. Is this ok to refill? Please Advise.

## 2017-04-01 MED ORDER — TRAMADOL HCL 50 MG PO TABS: 50.0000 mg | ORAL_TABLET | Freq: Four times a day (QID) | ORAL | 0 refills | Status: DC | PRN

## 2017-04-01 NOTE — Telephone Encounter (Signed)
Rx printed and faxed to pharmacy. Tried to contact patient but # has changed

## 2017-04-02 ENCOUNTER — Encounter: Payer: Self-pay | Admitting: Nurse Practitioner

## 2017-04-02 ENCOUNTER — Other Ambulatory Visit: Payer: Self-pay | Admitting: Nurse Practitioner

## 2017-04-02 ENCOUNTER — Ambulatory Visit (INDEPENDENT_AMBULATORY_CARE_PROVIDER_SITE_OTHER): Payer: PPO | Admitting: Nurse Practitioner

## 2017-04-02 VITALS — BP 136/84 | HR 98 | Temp 97.9°F | Resp 17 | Ht 61.0 in | Wt 172.4 lb

## 2017-04-02 DIAGNOSIS — R413 Other amnesia: Secondary | ICD-10-CM | POA: Diagnosis not present

## 2017-04-02 DIAGNOSIS — K219 Gastro-esophageal reflux disease without esophagitis: Secondary | ICD-10-CM

## 2017-04-02 DIAGNOSIS — G8929 Other chronic pain: Secondary | ICD-10-CM

## 2017-04-02 DIAGNOSIS — M5442 Lumbago with sciatica, left side: Secondary | ICD-10-CM | POA: Diagnosis not present

## 2017-04-02 DIAGNOSIS — F411 Generalized anxiety disorder: Secondary | ICD-10-CM

## 2017-04-02 DIAGNOSIS — M5441 Lumbago with sciatica, right side: Secondary | ICD-10-CM | POA: Diagnosis not present

## 2017-04-02 LAB — CBC WITH DIFFERENTIAL/PLATELET
BASOS ABS: 0 {cells}/uL (ref 0–200)
Basophils Relative: 0 %
EOS PCT: 9 %
Eosinophils Absolute: 522 cells/uL — ABNORMAL HIGH (ref 15–500)
HCT: 35.5 % (ref 35.0–45.0)
HEMOGLOBIN: 11.2 g/dL — AB (ref 11.7–15.5)
LYMPHS ABS: 2842 {cells}/uL (ref 850–3900)
Lymphocytes Relative: 49 %
MCH: 27.2 pg (ref 27.0–33.0)
MCHC: 31.5 g/dL — AB (ref 32.0–36.0)
MCV: 86.2 fL (ref 80.0–100.0)
MPV: 8.3 fL (ref 7.5–12.5)
Monocytes Absolute: 406 cells/uL (ref 200–950)
Monocytes Relative: 7 %
NEUTROS PCT: 35 %
Neutro Abs: 2030 cells/uL (ref 1500–7800)
Platelets: 364 10*3/uL (ref 140–400)
RBC: 4.12 MIL/uL (ref 3.80–5.10)
RDW: 16.2 % — AB (ref 11.0–15.0)
WBC: 5.8 10*3/uL (ref 3.8–10.8)

## 2017-04-02 MED ORDER — DULOXETINE HCL 30 MG PO CPEP: 30.0000 mg | ORAL_CAPSULE | Freq: Every day | ORAL | 0 refills | Status: DC

## 2017-04-02 NOTE — Progress Notes (Signed)
Careteam: Patient Care Team: Gildardo Cranker, DO as PCP - General (Internal Medicine) Hendricks Limes, MD as Consulting Physician (Internal Medicine) Shirley Muscat Loreen Freud, MD as Referring Physician (Optometry)  Advanced Directive information Does Patient Have a Medical Advance Directive?: No  Allergies  Allergen Reactions  . Clonazepam Swelling and Other (See Comments)    Tongue thickening sensation  . Codeine Rash  . Lexapro [Escitalopram Oxalate] Other (See Comments)    Made her "feel funny"  . Aspirin Nausea And Vomiting and Other (See Comments)    Stomach upset    Chief Complaint  Patient presents with  . Acute Visit    Pt is being seen due to ongoing back pain x several years and bilateral leg burning/itching x 2-3 weeks.      HPI: Patient is a 83 y.o. female seen in the office today due to back pain.  However reports back does not really "hurt" just feeling heavy and aches.  Talks of injection 4 years ago. Pt with Hx lumbar spondylolisthesis/DDD/spinal stenosis - Last MRI in 04/2013.  MRI L spine in Aug 2017 showed worsening arthritic changes in lumbar spine and severe spinal stenosis at L4-L5, bulging discs. No compression of spinal cord. She had epidural 05/18/13. IR did not feel she needed epidural in Oct 2017 when she saw them. CT C spine in 2013 revealed mild multilevel DDD; xray C spine in June 2017 revealed loss of C spine curvature, diffuse DDD from C4-C7 with some foraminall narrowing at C5-6 and C6-7.  Has follow up with Dr Eulas Post on 04/10/17. (next week), thought she needed to be seen today for refill on medication (that was approved yesterday). She talks about her panic attack in March. Reports medication was not working however upon further investigation she has not been taking them. Reports she took 2 dose (medication was increased in January)   Reports neuropathy in legs, not taking lyrica which is on medication list.    MMSE - Mini Mental State Exam  09/12/2016  Orientation to time 5  Orientation to Place 5  Registration 3  Attention/ Calculation 1  Recall 3  Language- name 2 objects 2  Language- repeat 1  Language- follow 3 step command 3  Language- read & follow direction 1  Write a sentence 1  Copy design 0  Total score 25     Review of Systems:  Review of Systems  Constitutional: Negative for chills, fever and weight loss.  HENT: Negative for tinnitus.   Respiratory: Negative for cough, sputum production and shortness of breath.   Cardiovascular: Negative for chest pain, palpitations and leg swelling.  Gastrointestinal: Positive for heartburn. Negative for abdominal pain, constipation and diarrhea.  Genitourinary: Negative for dysuria, frequency and urgency.  Musculoskeletal: Positive for back pain and myalgias. Negative for falls and joint pain.  Skin: Negative.   Neurological: Positive for tingling. Negative for dizziness, weakness and headaches.  Psychiatric/Behavioral: Positive for memory loss. Negative for depression. The patient is nervous/anxious. The patient does not have insomnia.     Past Medical History:  Diagnosis Date  . Allergy    seasonal  . Diverticulosis 2004  . Esophageal stricture   . GERD (gastroesophageal reflux disease)    has had EGD 2006-negative  . Helicobacter positive gastritis 2006  . Hypertension     On no medication. Home readings SBP=150's  . Internal hemorrhoids   . Irregular heart beat    no testing, holter, etc. Takes no medication  . Near  syncope    diagnosed as anxiety related. gets some relief with clonazepam. No formal testing done.   Past Surgical History:  Procedure Laterality Date  . ABDOMINAL HYSTERECTOMY  1974   fibroid tumors w/ metorrhagia  . Robert.Gallant - NSVD  midwife delivery for two    . TONSILLECTOMY    . TONSILLECTOMY     Social History:   reports that she has never smoked. She has never used smokeless tobacco. She reports that she does not drink alcohol or  use drugs.  Family History  Problem Relation Age of Onset  . Heart disease Mother   . Stroke Mother   . Hypertension Mother   . Cancer Father        lung cancer - smoker  . Heart disease Sister        MI  . Kidney disease Sister        end stage renal disease on HD  . Diabetes Sister   . Heart disease Brother   . Diabetes Sister   . Hypertension Sister   . Kidney disease Sister   . Cancer Sister   . Diabetes Sister   . Kidney disease Sister        HD  . Hypertension Sister   . Heart disease Sister   . Hypertension Sister   . Heart disease Sister   . Hypertension Sister   . Hypertension Sister     Medications: Patient's Medications  New Prescriptions   No medications on file  Previous Medications   DIAZEPAM (VALIUM) 5 MG TABLET    TAKE 1 TABLET BY MOUTH EVERY 8 HOURS AS NEEDED   ESOMEPRAZOLE (NEXIUM) 40 MG CAPSULE    Take 1 capsule (40 mg total) by mouth daily.   OVER THE COUNTER MEDICATION    Apply 1 application topically daily. "Two [employer]ats - Moisturizing lotion"   PATADAY 0.2 % SOLN    1 drop in right eye every morning   PREGABALIN (LYRICA) 50 MG CAPSULE    Take 1 capsule (50 mg total) by mouth 2 (two) times daily.   RESTASIS 0.05 % OPHTHALMIC EMULSION    Place 1 Dose into both eyes 2 (two) times daily.    SUCRALFATE (CARAFATE) 1 G TABLET    Take 1 g by mouth 3 (three) times daily as needed.   TRAMADOL (ULTRAM) 50 MG TABLET    Take 1 tablet (50 mg total) by mouth every 6 (six) hours as needed for moderate pain or severe pain.   VALSARTAN (DIOVAN) 80 MG TABLET    Take 1 tablet (80 mg total) by mouth daily.   VENLAFAXINE XR (EFFEXOR-XR) 150 MG 24 HR CAPSULE    Take 1 capsule (150 mg total) by mouth daily with breakfast.   VITAMIN C (ASCORBIC ACID) 500 MG TABLET    Take 500 mg by mouth daily.  Modified Medications   No medications on file  Discontinued Medications   No medications on file     Physical Exam:  Vitals:   04/02/17 0931  BP: 136/84  Pulse: 98    Resp: 17  Temp: 97.9 F (36.6 C)  TempSrc: Oral  SpO2: 98%  Weight: 172 lb 6.4 oz (78.2 kg)  Height: 5\' 1"  (1.549 m)   Body mass index is 32.57 kg/m.  Physical Exam  Constitutional: She is oriented to person, place, and time. She appears well-developed and well-nourished. No distress.  HENT:  Head: Normocephalic and atraumatic.  Neck: Normal range of motion.  Cardiovascular: Normal rate and regular rhythm.  Exam reveals no gallop and no friction rub.   Murmur heard. Pulmonary/Chest: Effort normal and breath sounds normal. No respiratory distress.  Musculoskeletal: Normal range of motion. She exhibits no edema or tenderness.  Neurological: She is alert and oriented to person, place, and time.  Skin: Skin is warm and dry. No rash noted.  Psychiatric: She has a normal mood and affect. Her behavior is normal.    Labs reviewed: Basic Metabolic Panel:  Recent Labs  04/18/16 1144 07/20/16 1058 11/28/16 1012 01/12/17 0740  NA 144 139 145 140  K 4.1 4.6 4.5 3.5  CL 104 106 111* 105  CO2 24 25 22 23   GLUCOSE 94 84 101* 102*  BUN 10 10 20 8   CREATININE 0.72 0.91* 0.85 0.80  CALCIUM 9.5 9.5 9.2 9.0  TSH 1.130  --   --   --    Liver Function Tests:  Recent Labs  07/20/16 1058 11/28/16 1012  AST 20  --   ALT 10 14  ALKPHOS 53  --   BILITOT 0.3  --   PROT 6.8  --   ALBUMIN 4.0  --    No results for input(s): LIPASE, AMYLASE in the last 8760 hours. No results for input(s): AMMONIA in the last 8760 hours. CBC:  Recent Labs  04/18/16 1144 01/12/17 0740  WBC 7.0 5.8  NEUTROABS 2.9  --   HGB  --  10.4*  HCT 38.0 31.7*  MCV 91 84.8  PLT 348 284   Lipid Panel: No results for input(s): CHOL, HDL, LDLCALC, TRIG, CHOLHDL, LDLDIRECT in the last 8760 hours. TSH:  Recent Labs  04/18/16 1144  TSH 1.130   A1C: Lab Results  Component Value Date   HGBA1C 6.1 04/09/2014     Assessment/Plan 1. Generalized anxiety disorder -not currently taking any medication,  Effexor was increased in January but only took 2 doses. Has full bottle of 150 mg today in office.  -start Cymbalta 30 mg daily  Effexor removed from medication list   2. Chronic midline low back pain with bilateral sciatica Stable, will start Cymbalta 30 mg daily to hopefully benefit with pain/neuropathy -lyrica removed from medication list as she has not been taking.   3. Memory loss MMSE 27/30 today, 25/30 in November of 2017. History was variable during exam, some accurate and other information was off.  Reviewed chart, RPR negative in 2012, CT head in 2013 revealed mild atrophy, likely age related. Will get lab work today     4. GERD Reports she has been on nexium "forever" and her sister told her she needs to change medications however she is not sure of the name but will bring at next visit.  Information provided on diet for GERD.   To keep appt with Dr Eulas Post next week  [employer]. Harle Battiest  [facility] [phone] 8 am - 5 pm) [phone] (after hours)

## 2017-04-02 NOTE — Patient Instructions (Addendum)
Please check your medications at home with what we have listed. If there are any differences please call us so we can clarify.   We removed Effexor and Lyrica off medication list since you are NOT taking  To start Cymbalta 30 mg daily (for anxiety and to help with leg pain)  Make sure you are taking nexium every day for your acid reflux  Food Choices for Gastroesophageal Reflux Disease, Adult When you have gastroesophageal reflux disease (GERD), the foods you eat and your eating habits are very important. Choosing the right foods can help ease your discomfort. What guidelines do I need to follow?  Choose fruits, vegetables, whole grains, and low-fat dairy products.  Choose low-fat meat, fish, and poultry.  Limit fats such as oils, salad dressings, butter, nuts, and avocado.  Keep a food diary. This helps you identify foods that cause symptoms.  Avoid foods that cause symptoms. These may be different for everyone.  Eat small meals often instead of 3 large meals a day.  Eat your meals slowly, in a place where you are relaxed.  Limit fried foods.  Cook foods using methods other than frying.  Avoid drinking alcohol.  Avoid drinking large amounts of liquids with your meals.  Avoid bending over or lying down until 2-3 hours after eating. What foods are not recommended? These are some foods and drinks that may make your symptoms worse: Vegetables Tomatoes. Tomato juice. Tomato and spaghetti sauce. Chili peppers. Onion and garlic. Horseradish. Fruits Oranges, grapefruit, and lemon (fruit and juice). Meats High-fat meats, fish, and poultry. This includes hot dogs, ribs, ham, sausage, salami, and bacon. Dairy Whole milk and chocolate milk. Sour cream. Cream. Butter. Ice cream. Cream cheese. Drinks Coffee and tea. Bubbly (carbonated) drinks or energy drinks. Condiments Hot sauce. Barbecue sauce. Sweets/Desserts Chocolate and cocoa. Donuts. Peppermint and spearmint. Fats and  Oils High-fat foods. This includes [place] fries and potato chips. Other Vinegar. Strong spices. This includes black pepper, white pepper, red pepper, cayenne, curry powder, cloves, ginger, and chili powder. The items listed above may not be a complete list of foods and drinks to avoid. Contact your dietitian for more information. This information is not intended to replace advice given to you by your health care provider. Make sure you discuss any questions you have with your health care provider. Document Released: 04/15/2012 Document Revised: 03/22/2016 Document Reviewed: 08/19/2013 Elsevier Interactive Patient Education  2017 [employer].

## 2017-04-03 LAB — COMPLETE METABOLIC PANEL WITH GFR
ALBUMIN: 3.8 g/dL (ref 3.6–5.1)
ALK PHOS: 50 U/L (ref 33–130)
ALT: 12 U/L (ref 6–29)
AST: 29 U/L (ref 10–35)
BILIRUBIN TOTAL: 0.4 mg/dL (ref 0.2–1.2)
BUN: 14 mg/dL (ref 7–25)
CALCIUM: 9.3 mg/dL (ref 8.6–10.4)
CO2: 21 mmol/L (ref 20–31)
CREATININE: 0.89 mg/dL — AB (ref 0.60–0.88)
Chloride: 107 mmol/L (ref 98–110)
GFR, Est African American: 69 mL/min (ref >=60)
GFR, Est Non African American: 60 mL/min (ref >=60)
GLUCOSE: 90 mg/dL (ref 65–99)
POTASSIUM: 5.1 mmol/L (ref 3.5–5.3)
SODIUM: 142 mmol/L (ref 135–146)
TOTAL PROTEIN: 7.1 g/dL (ref 6.1–8.1)

## 2017-04-03 LAB — TSH: TSH: 0.81 mIU/L

## 2017-04-10 ENCOUNTER — Ambulatory Visit (INDEPENDENT_AMBULATORY_CARE_PROVIDER_SITE_OTHER): Payer: PPO | Admitting: Internal Medicine

## 2017-04-10 ENCOUNTER — Encounter: Payer: Self-pay | Admitting: Internal Medicine

## 2017-04-10 VITALS — BP 132/86 | HR 77 | Temp 98.3°F | Ht 61.0 in | Wt 170.0 lb

## 2017-04-10 DIAGNOSIS — M5441 Lumbago with sciatica, right side: Secondary | ICD-10-CM

## 2017-04-10 DIAGNOSIS — F411 Generalized anxiety disorder: Secondary | ICD-10-CM

## 2017-04-10 DIAGNOSIS — M5030 Other cervical disc degeneration, unspecified cervical region: Secondary | ICD-10-CM

## 2017-04-10 DIAGNOSIS — M5442 Lumbago with sciatica, left side: Secondary | ICD-10-CM | POA: Diagnosis not present

## 2017-04-10 DIAGNOSIS — G8929 Other chronic pain: Secondary | ICD-10-CM

## 2017-04-10 DIAGNOSIS — R413 Other amnesia: Secondary | ICD-10-CM

## 2017-04-10 DIAGNOSIS — I8390 Asymptomatic varicose veins of unspecified lower extremity: Secondary | ICD-10-CM | POA: Diagnosis not present

## 2017-04-10 DIAGNOSIS — M5416 Radiculopathy, lumbar region: Secondary | ICD-10-CM | POA: Diagnosis not present

## 2017-04-10 MED ORDER — DIAZEPAM 5 MG PO TABS: 5.0000 mg | ORAL_TABLET | Freq: Three times a day (TID) | ORAL | 0 refills | Status: DC | PRN

## 2017-04-10 NOTE — Patient Instructions (Signed)
Recommend you wear compression stockings during the day to help support varicose veins.  Continue current medications as ordered  Follow up in 3 mos for anxiety, varicose veins, memory loss.    Varicose Veins Varicose veins are veins that have become enlarged and twisted. They are usually seen in the legs but can occur in other parts of the body as well. What are the causes? This condition is the result of valves in the veins not working properly. Valves in the veins help to return blood from the leg to the heart. If these valves are damaged, blood flows backward and backs up into the veins in the leg near the skin. This causes the veins to become larger. What increases the risk? People who are on their feet a lot, who are pregnant, or who are overweight are more likely to develop varicose veins. What are the signs or symptoms?  Bulging, twisted-appearing, bluish veins, most commonly found on the legs.  Leg pain or a feeling of heaviness. These symptoms may be worse at the end of the day.  Leg swelling.  Changes in skin color. How is this diagnosed? A health care provider can usually diagnose varicose veins by examining your legs. Your health care provider may also recommend an ultrasound of your leg veins. How is this treated? Most varicose veins can be treated at home.However, other treatments are available for people who have persistent symptoms or want to improve the cosmetic appearance of the varicose veins. These treatment options include:  Sclerotherapy. A solution is injected into the vein to close it off.  Laser treatment. A laser is used to heat the vein to close it off.  Radiofrequency vein ablation. An electrical current produced by radio waves is used to close off the vein.  Phlebectomy. The vein is surgically removed through small incisions made over the varicose vein.  Vein ligation and stripping. The vein is surgically removed through incisions made over the  varicose vein after the vein has been tied (ligated).  Follow these instructions at home:  Do not stand or sit in one position for long periods of time. Do not sit with your legs crossed. Rest with your legs raised during the day.  Wear compression stockings as directed by your health care provider. These stockings help to prevent blood clots and reduce swelling in your legs.  Do not wear other tight, encircling garments around your legs, pelvis, or waist.  Walk as much as possible to increase blood flow.  Raise the foot of your bed at night with 2-inch blocks.  If you get a cut in the skin over the vein and the vein bleeds, lie down with your leg raised and press on it with a clean cloth until the bleeding stops. Then place a bandage (dressing) on the cut. See your health care provider if it continues to bleed. Contact a health care provider if:  The skin around your ankle starts to break down.  You have pain, redness, tenderness, or hard swelling in your leg over a vein.  You are uncomfortable because of leg pain. This information is not intended to replace advice given to you by your health care provider. Make sure you discuss any questions you have with your health care provider. Document Released: 07/25/2005 Document Revised: 03/22/2016 Document Reviewed: 04/17/2016 Elsevier Interactive Patient Education  2017 [employer].

## 2017-04-10 NOTE — Progress Notes (Signed)
Patient ID: Alexandria Beck, female   DOB: 10-17-1931, 83 y.o.   MRN: [id number]    Location:  [facility] Place of Service: OFFICE  Chief Complaint  Patient presents with  . Medical Management of Chronic Issues    4 month follow-up, discuss labs (copy printed). Examine right lower leg( skin discoloraation and off/on swelling)   . Health Maintenance    HM indicates foot exam due (Patient not DM), Refused BMD, patient will set up eye exam, A1c due according to [place]    . Medication Refill    Discuss new rx (valium not working as good), prefers Xanax   . Medication Management    Discuss effexor vs Cymbalta (currently on Cymbalta)     HPI:  83 yo female seen today for f/u. She was seen last week for med RF. effexor XR stopped due to pt noncompliant. She was started on cymbalta instead. She was taken to the ED in March 2018 for CP. Cardiac w/u neg. She is a poor historian due to memory loss. Hx obtained from chart  She is c/a area on right lateral leg that becomes "puffy" with prolonged standing. Venous doppler US RLE in Feb 2018 neg for DVT  GAD -  had a severe panic attack a several mos ago and EMS called. Heart checked out. She was not taken to ED. She tried higher dose of effexor xr which caused increased anxiety and she stopped after 1 dose. She was started on cymbalta 20m daily last week which is helping. no ADRs reported. She takes diazepam BID but does not feel that it works as well as the xanax. Clonopin caused swelling. Sleeping better overall. She feels occasional fatigue and anhedonia. Denies depression  HTN - BP controlled on diovan  GERD - sx's stable on carafate and nexium. She has constipation which is better on miralax  Hx lumbar spondylolisthesis/DDD/spinal stenosis - occasional back pain with neuropathy. Last MRI in 04/2013.  MRI L spine in Aug 2017 showed worsening arthritic changes in lumbar spine and severe spinal stenosis at L4-L5, bulging discs. No  compression of spinal cord. She had epidural 05/18/13. IR did not feel she needed epidural in Oct 2017 when she saw them. CT C spine in 2013 revealed mild multilevel DDD; xray C spine in June 2017 revealed loss of C spine curvature, diffuse DDD from C4-C7 with some foraminal narrowing at C5-6 and C6-7.  Memory loss, short term - MMSE 27/30. She does not take any meds for cognition.  Past Medical History:  Diagnosis Date  . Allergy    seasonal  . Diverticulosis 2004  . Esophageal stricture   . GERD (gastroesophageal reflux disease)    has had EGD 2006-negative  . Helicobacter positive gastritis 2006  . Hypertension     On no medication. Home readings SBP=150's  . Internal hemorrhoids   . Irregular heart beat    no testing, holter, etc. Takes no medication  . Near syncope    diagnosed as anxiety related. gets some relief with clonazepam. No formal testing done.    Past Surgical History:  Procedure Laterality Date  . ABDOMINAL HYSTERECTOMY  1974   fibroid tumors w/ metorrhagia  . GRobert.Gallant- NSVD  midwife delivery for two    . TONSILLECTOMY    . TONSILLECTOMY      Patient Care Team: CGildardo Cranker DO as PCP - General (Internal Medicine) HLinna DarnerWDarrick Penna MD as Consulting Physician (Internal Medicine) BCalton Dach MD as Referring  Physician Transport planner)  Social History   Social History  . Marital status: Widowed    Spouse name: N/A  . Number of children: 4  . Years of education: 52   Occupational History  . stock person     retired   Social History Main Topics  . Smoking status: Never Smoker  . Smokeless tobacco: Never Used  . Alcohol use No  . Drug use: No  . Sexual activity: Not Currently   Other Topics Concern  . Not on file   Social History Narrative   Diet:      Do you drink/ eat things with caffeine? yes      Marital status:   widow                            What year were you married ?  1953      Do you live in a house, apartment,assistred  living, condo, trailer, etc.)? house      Is it one or more stories? 1 story      How many persons live in your home ?2      Do you have any pets in your home ?(please list)  none      Current or past profession: [employer]      Do you exercise?  yes                            Type & how often:  walking      Do you have a living will?  no      Do you have a DNR form?     no                  If not, do you want to discuss one?       Do you have signed POA?HPOA forms?  no               If so, please bring to your        appointment                          reports that she has never smoked. She has never used smokeless tobacco. She reports that she does not drink alcohol or use drugs.  Family History  Problem Relation Age of Onset  . Heart disease Mother   . Stroke Mother   . Hypertension Mother   . Cancer Father        lung cancer - smoker  . Heart disease Sister        MI  . Kidney disease Sister        end stage renal disease on HD  . Diabetes Sister   . Heart disease Brother   . Diabetes Sister   . Hypertension Sister   . Kidney disease Sister   . Cancer Sister   . Diabetes Sister   . Kidney disease Sister        HD  . Hypertension Sister   . Heart disease Sister   . Hypertension Sister   . Heart disease Sister   . Hypertension Sister   . Hypertension Sister    Family Status  Relation Status  . Mother Deceased at age 80  . Father Deceased at age 42  . Sister Deceased  . Brother Deceased  CAD/MI  . Sister Deceased  . Sister Deceased  . Sister Alive  . Sister Alive  . Sister Alive  . Sister Alive  . Son Alive  . Daughter Alive  . Son Alive  . Son Alive  . Sister (Not Specified)  . Brother (Not Specified)  . Sister (Not Specified)  . Sister (Not Specified)  . Sister (Not Specified)  . Sister (Not Specified)  . Sister (Not Specified)  . Sister (Not Specified)  . Sister (Not Specified)     Allergies  Allergen Reactions  .  Clonazepam Swelling and Other (See Comments)    Tongue thickening sensation  . Codeine Rash  . Lexapro [Escitalopram Oxalate] Other (See Comments)    Made her "feel funny"  . Aspirin Nausea And Vomiting and Other (See Comments)    Stomach upset    Medications: Patient's Medications  New Prescriptions   No medications on file  Previous Medications   DIAZEPAM (VALIUM) 5 MG TABLET    TAKE 1 TABLET BY MOUTH EVERY 8 HOURS AS NEEDED   DULOXETINE (CYMBALTA) 30 MG CAPSULE    Take 1 capsule (30 mg total) by mouth daily.   ESOMEPRAZOLE (NEXIUM) 40 MG CAPSULE    Take 1 capsule (40 mg total) by mouth daily.   OVER THE COUNTER MEDICATION    Apply 1 application topically daily. "Two Old Goats - Moisturizing lotion"   PATADAY 0.2 % SOLN    1 drop in right eye every morning   RESTASIS 0.05 % OPHTHALMIC EMULSION    Place 1 Dose into both eyes 2 (two) times daily.    SUCRALFATE (CARAFATE) 1 G TABLET    Take 1 g by mouth 3 (three) times daily as needed.   TRAMADOL (ULTRAM) 50 MG TABLET    Take 1 tablet (50 mg total) by mouth every 6 (six) hours as needed for moderate pain or severe pain.   VALSARTAN (DIOVAN) 80 MG TABLET    Take 1 tablet (80 mg total) by mouth daily.   VITAMIN C (ASCORBIC ACID) 500 MG TABLET    Take 500 mg by mouth daily.  Modified Medications   No medications on file  Discontinued Medications   No medications on file    Review of Systems  Unable to perform ROS: Dementia    Vitals:   04/10/17 0839  BP: 132/86  Pulse: 77  Temp: 98.3 F (36.8 C)  TempSrc: Oral  SpO2: 98%  Weight: 170 lb (77.1 kg)  Height: _0  (1.549 m)   Body mass index is 32.12 kg/m.  Physical Exam  Constitutional: She is oriented to person, place, and time. She appears well-developed and well-nourished.  HENT:  Head:    Mouth/Throat: Oropharynx is clear and moist. No oropharyngeal exudate.  Right and Left TM appears dull but intact. No external ear canal swelling or redness. No ear d/c. No FB    Eyes: Pupils are equal, round, and reactive to light. No scleral icterus.  Neck: Neck supple. Carotid bruit is present (b/l systolic). No tracheal deviation present. No thyromegaly present.  Cardiovascular: Normal rate, regular rhythm and intact distal pulses.  Exam reveals no gallop and no friction rub.   Murmur (2/6 SEM --> carotid b/l) heard. Right lateral mid-distal leg palpable TTP cord; neg Homan's sign. No calf TTP. No LE edema b/l. Right leg palpable varicose veins,, soft NT  Pulmonary/Chest: Effort normal and breath sounds normal. No stridor. No respiratory distress. She has no wheezes. She has no rales.  Abdominal: Soft. Bowel sounds are normal. She exhibits no distension and no mass. There is no hepatomegaly. There is no tenderness. There is no rebound and no guarding.  Musculoskeletal: She exhibits edema and tenderness.       Back:  Lymphadenopathy:    She has no cervical adenopathy.  Neurological: She is alert and oriented to person, place, and time.  Skin: Skin is warm and dry. No rash noted.     Psychiatric: She has a normal mood and affect. Her behavior is normal. Judgment and thought content normal.     Labs reviewed: Office Visit on 04/02/2017  Component Date Value Ref Range Status  . TSH 04/02/2017 0.81  mIU/L Final   Comment:   Reference Range   > or = 20 Years  0.40-4.50   Pregnancy Range First trimester  0.26-2.66 Second trimester 0.55-2.73 Third trimester  0.43-2.91     . Sodium 04/02/2017 142  135 - 146 mmol/L Final  . Potassium 04/02/2017 5.1  3.5 - 5.3 mmol/L Final  . Chloride 04/02/2017 107  98 - 110 mmol/L Final  . CO2 04/02/2017 21  20 - 31 mmol/L Final  . Glucose, Bld 04/02/2017 90  65 - 99 mg/dL Final  . BUN 04/02/2017 14  7 - 25 mg/dL Final  . Creat 04/02/2017 0.89* 0.60 - 0.88 mg/dL Final   Comment:   For patients > or = 81 years of age: The upper reference limit for Creatinine is approximately 13% higher for people identified  as African-American.     . Total Bilirubin 04/02/2017 0.4  0.2 - 1.2 mg/dL Final  . Alkaline Phosphatase 04/02/2017 50  33 - 130 U/L Final  . AST 04/02/2017 29  10 - 35 U/L Final  . ALT 04/02/2017 12  6 - 29 U/L Final  . Total Protein 04/02/2017 7.1  6.1 - 8.1 g/dL Final  . Albumin 04/02/2017 3.8  3.6 - 5.1 g/dL Final  . Calcium 04/02/2017 9.3  8.6 - 10.4 mg/dL Final  . GFR, Est African American 04/02/2017 69  >=60 mL/min Final  . GFR, Est Non African American 04/02/2017 60  >=60 mL/min Final  . WBC 04/02/2017 5.8  3.8 - 10.8 K/uL Final  . RBC 04/02/2017 4.12  3.80 - 5.10 MIL/uL Final  . Hemoglobin 04/02/2017 11.2* 11.7 - 15.5 g/dL Final  . HCT 04/02/2017 35.5  35.0 - 45.0 % Final  . MCV 04/02/2017 86.2  80.0 - 100.0 fL Final  . MCH 04/02/2017 27.2  27.0 - 33.0 pg Final  . MCHC 04/02/2017 31.5* 32.0 - 36.0 g/dL Final  . RDW 04/02/2017 16.2* 11.0 - 15.0 % Final  . Platelets 04/02/2017 364  140 - 400 K/uL Final  . MPV 04/02/2017 8.3  7.5 - 12.5 fL Final  . Neutro Abs 04/02/2017 2030  1,500 - 7,800 cells/uL Final  . Lymphs Abs 04/02/2017 2842  850 - 3,900 cells/uL Final  . Monocytes Absolute 04/02/2017 406  200 - 950 cells/uL Final  . Eosinophils Absolute 04/02/2017 522* 15 - 500 cells/uL Final  . Basophils Absolute 04/02/2017 0  0 - 200 cells/uL Final  . Neutrophils Relative % 04/02/2017 35  % Final  . Lymphocytes Relative 04/02/2017 49  % Final  . Monocytes Relative 04/02/2017 7  % Final  . Eosinophils Relative 04/02/2017 9  % Final  . Basophils Relative 04/02/2017 0  % Final  . Smear Review 04/02/2017 Criteria for review not met   Final  Abstract on 03/20/2017  Component  Date Value Ref Range Status  . POC Glucose 03/20/2017 94  70 - 99 mg/dl Final   [facility] ASSIST  Admission on 01/12/2017, Discharged on 01/12/2017  Component Date Value Ref Range Status  . Sodium 01/12/2017 140  135 - 145 mmol/L Final  . Potassium 01/12/2017 3.5  3.5 - 5.1 mmol/L Final  . Chloride 01/12/2017  105  101 - 111 mmol/L Final  . CO2 01/12/2017 23  22 - 32 mmol/L Final  . Glucose, Bld 01/12/2017 102* 65 - 99 mg/dL Final  . BUN 01/12/2017 8  6 - 20 mg/dL Final  . Creatinine, Ser 01/12/2017 0.80  0.44 - 1.00 mg/dL Final  . Calcium 01/12/2017 9.0  8.9 - 10.3 mg/dL Final  . GFR calc non Af Amer 01/12/2017 >60  >60 mL/min Final  . GFR calc Af Amer 01/12/2017 >60  >60 mL/min Final   Comment: (NOTE) The eGFR has been calculated using the CKD EPI equation. This calculation has not been validated in all clinical situations. eGFR's persistently <60 mL/min signify possible Chronic Kidney Disease.   . Anion gap 01/12/2017 12  5 - 15 Final  . WBC 01/12/2017 5.8  4.0 - 10.5 K/uL Final  . RBC 01/12/2017 3.74* 3.87 - 5.11 MIL/uL Final  . Hemoglobin 01/12/2017 10.4* 12.0 - 15.0 g/dL Final  . HCT 01/12/2017 31.7* 36.0 - 46.0 % Final  . MCV 01/12/2017 84.8  78.0 - 100.0 fL Final  . MCH 01/12/2017 27.8  26.0 - 34.0 pg Final  . MCHC 01/12/2017 32.8  30.0 - 36.0 g/dL Final  . RDW 01/12/2017 14.9  11.5 - 15.5 % Final  . Platelets 01/12/2017 284  150 - 400 K/uL Final  . Troponin i, poc 01/12/2017 0.00  0.00 - 0.08 ng/mL Final  . Comment 3 01/12/2017          Final   Comment: Due to the release kinetics of cTnI, a negative result within the first hours of the onset of symptoms does not rule out myocardial infarction with certainty. If myocardial infarction is still suspected, repeat the test at appropriate intervals.     No results found.   Assessment/Plan   ICD-10-CM   1. Generalized anxiety disorder F41.1 diazepam (VALIUM) 5 MG tablet  2. Memory loss R41.3   3. Varicose vein of leg I83.90   4. Left lumbar radiculopathy M54.16   5. DDD (degenerative disc disease), cervical M50.30   6. Chronic midline low back pain with bilateral sciatica M54.41    M54.42    G89.29    Recommend you wear compression stockings during the day to help support varicose veins.  Continue current medications  as ordered  Follow up in 3 mos for anxiety, varicose veins, memory loss.   handout on varicose veins given   Zalea Pete S. Perlie Gold  [facility] [address] [place], [place] [address] [phone] Cell (Monday-Friday 8 AM - 5 PM) [phone] After 5 PM and follow prompts

## 2017-04-13 ENCOUNTER — Encounter (HOSPITAL_COMMUNITY): Payer: Self-pay | Admitting: Emergency Medicine

## 2017-04-13 ENCOUNTER — Emergency Department (HOSPITAL_COMMUNITY)
Admission: EM | Admit: 2017-04-13 | Discharge: 2017-04-13 | Disposition: A | Discharge status: Home / Self Care | Payer: PPO | Attending: Emergency Medicine | Admitting: Emergency Medicine

## 2017-04-13 DIAGNOSIS — Z79899 Other long term (current) drug therapy: Secondary | ICD-10-CM | POA: Insufficient documentation

## 2017-04-13 DIAGNOSIS — K5900 Constipation, unspecified: Secondary | ICD-10-CM | POA: Diagnosis not present

## 2017-04-13 DIAGNOSIS — K5641 Fecal impaction: Secondary | ICD-10-CM | POA: Diagnosis not present

## 2017-04-13 DIAGNOSIS — I10 Essential (primary) hypertension: Secondary | ICD-10-CM | POA: Diagnosis not present

## 2017-04-13 NOTE — ED Provider Notes (Signed)
[facility] DEPT Provider Note   CSN: [id number] Arrival date & time: 04/13/17  0413     History   Chief Complaint Chief Complaint  Patient presents with  . Constipation    HPI Alexandria Beck is a 83 y.o. female.  83 yo F with a chief complaint of constipation. Going on for a couple weeks. She has tried multiple over-the-counter remedies without improvement. She feels pain in her rectum but denies other areas of tenderness. Denies fevers or abdominal pain. Denies vomiting.   The history is provided by the patient.  Constipation   This is a new problem. The current episode started more than 1 week ago. Pertinent negatives include no dysuria. She has tried stimulants for the symptoms. The treatment provided no relief.    Past Medical History:  Diagnosis Date  . Allergy    seasonal  . Diverticulosis 2004  . Esophageal stricture   . GERD (gastroesophageal reflux disease)    has had EGD 2006-negative  . Helicobacter positive gastritis 2006  . Hypertension     On no medication. Home readings SBP=150's  . Internal hemorrhoids   . Irregular heart beat    no testing, holter, etc. Takes no medication  . Near syncope    diagnosed as anxiety related. gets some relief with clonazepam. No formal testing done.    Patient Active Problem List   Diagnosis Date Noted  . Left lumbar radiculopathy 06/05/2016  . Generalized anxiety disorder 04/18/2016  . Neck pain 04/18/2016  . Tinnitus 04/18/2016  . Varicose veins of both lower extremities 03/11/2015  . Pain in the chest   . Chest pain 12/21/2014  . Insomnia 07/08/2014  . PVC's (premature ventricular contractions) 05/20/2014  . Murmur 05/20/2014  . Dysphagia 11/12/2013  . Routine health maintenance 10/14/2013  . Other and unspecified hyperlipidemia 10/13/2013  . Chronic back pain 10/13/2013  . 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 Paresthesia of lower extremity 01/08/2013  .  Paresthesia of hand 06/18/2012  . Urticaria 08/28/2011  . Anxiety 08/19/2011  . Near syncope   . GERD (gastroesophageal reflux disease)   . Hypertension     Past Surgical History:  Procedure Laterality Date  . ABDOMINAL HYSTERECTOMY  1974   fibroid tumors w/ metorrhagia  . [place].Gallant - NSVD  midwife delivery for two    . TONSILLECTOMY    . TONSILLECTOMY      OB History    No data available       Home Medications    Prior to Admission medications   Medication Sig Start Date End Date Taking? Authorizing Provider  diazepam (VALIUM) 5 MG tablet Take 1 tablet (5 mg total) by mouth every 8 (eight) hours as needed. Patient taking differently: Take 5 mg by mouth every 8 (eight) hours as needed for anxiety or muscle spasms.  04/10/17  Yes Gildardo Cranker, DO  DULoxetine (CYMBALTA) 30 MG capsule Take 1 capsule (30 mg total) by mouth daily. Patient taking differently: Take 30 mg by mouth daily after breakfast.  04/02/17  Yes Lauree Chandler, NP  esomeprazole (NEXIUM) 40 MG capsule Take 1 capsule (40 mg total) by mouth daily. Patient taking differently: Take 40 mg by mouth daily after breakfast.  09/12/16  Yes Eulas Post, Monica, DO  OVER THE COUNTER MEDICATION Apply 1 application topically daily. "Two Old Goats - Moisturizing lotion"   Yes [provider]  PATADAY 0.2 % SOLN 1 drop in right eye every morning  07/19/15  Yes [provider]  RESTASIS 0.05 % ophthalmic emulsion Place 1 drop into both eyes 2 (two) times daily.  12/20/14  Yes [provider]  sucralfate (CARAFATE) 1 g tablet Take 1 g by mouth 3 (three) times daily as needed (stomach pain).    Yes [provider]  traMADol (ULTRAM) 50 MG tablet Take 1 tablet (50 mg total) by mouth every 6 (six) hours as needed for moderate pain or severe pain. 04/01/17  Yes Reed, Tiffany L, DO  valsartan (DIOVAN) 80 MG tablet Take 1 tablet (80 mg total) by mouth daily. Patient taking differently: Take 80 mg by mouth daily  after breakfast.  11/28/16  Yes Gildardo Cranker, DO  vitamin C (ASCORBIC ACID) 500 MG tablet Take 500 mg by mouth daily.   Yes [provider]    Family History Family History  Problem Relation Age of Onset  . Heart disease Mother   . Stroke Mother   . Hypertension Mother   . Cancer Father        lung cancer - smoker  . Heart disease Sister        MI  . Kidney disease Sister        end stage renal disease on HD  . Diabetes Sister   . Heart disease Brother   . Diabetes Sister   . Hypertension Sister   . Kidney disease Sister   . Cancer Sister   . Diabetes Sister   . Kidney disease Sister        HD  . Hypertension Sister   . Heart disease Sister   . Hypertension Sister   . Heart disease Sister   . Hypertension Sister   . Hypertension Sister     Social History Social History  Substance Use Topics  . Smoking status: Never Smoker  . Smokeless tobacco: Never Used  . Alcohol use No     Allergies   Clonazepam; Codeine; Lexapro [escitalopram oxalate]; and Aspirin   Review of Systems Review of Systems  Constitutional: Negative for chills and fever.  HENT: Negative for congestion and rhinorrhea.   Eyes: Negative for redness and visual disturbance.  Respiratory: Negative for shortness of breath and wheezing.   Cardiovascular: Negative for chest pain and palpitations.  Gastrointestinal: Positive for constipation. Negative for nausea and vomiting.  Genitourinary: Negative for dysuria and urgency.  Musculoskeletal: Negative for arthralgias and myalgias.  Skin: Negative for pallor and wound.  Neurological: Negative for dizziness and headaches.     Physical Exam Updated Vital Signs BP (!) 158/86   Pulse 88   Temp 98.3 F (36.8 C) (Oral)   Resp 18   Ht 5\' 1"  (1.549 m)   Wt 77.6 kg (171 lb)   SpO2 98%   BMI 32.31 kg/m   Physical Exam  Constitutional: She is oriented to person, place, and time. She appears well-developed and well-nourished. No distress.    HENT:  Head: Normocephalic and atraumatic.  Eyes: EOM are normal. Pupils are equal, round, and reactive to light.  Neck: Normal range of motion. Neck supple.  Cardiovascular: Normal rate and regular rhythm.  Exam reveals no gallop and no friction rub.   No murmur heard. Pulmonary/Chest: Effort normal. She has no wheezes. She has no rales.  Abdominal: Soft. She exhibits no distension. There is no tenderness.  Genitourinary:  Genitourinary Comments: Fecal impaction  Musculoskeletal: She exhibits no edema or tenderness.  Neurological: She is alert and oriented to person, place, and time.  Skin: Skin is warm and dry. She is not diaphoretic.  Psychiatric: She has a normal mood and affect. Her behavior is normal.  Nursing note and vitals reviewed.    ED Treatments / Results  Labs (all labs ordered are listed, but only abnormal results are displayed) Labs Reviewed - No data to display  EKG  EKG Interpretation None       Radiology No results found.  Procedures Procedures (including critical care time)  Medications Ordered in ED Medications - No data to display   Initial Impression / Assessment and Plan / ED Course  I have reviewed the triage vital signs and the nursing notes.  Pertinent labs & imaging results that were available during my care of the patient were reviewed by me and considered in my medical decision making (see chart for details).     83 yo F Clinically with a fecal impaction. Some stools able to be removed. Patient feeling mildly better. We'll have her do a cleanout at home. Strict return precautions.  7:37 AM:  I have discussed the diagnosis/risks/treatment options with the patient and family and believe the pt to be eligible for discharge home to follow-up with PCP. We also discussed returning to the ED immediately if new or worsening sx occur. We discussed the sx which are most concerning (e.g., sudden worsening pain, fever, inability to tolerate by  mouth) that necessitate immediate return. Medications administered to the patient during their visit and any new prescriptions provided to the patient are listed below.  Medications given during this visit Medications - No data to display   The patient appears reasonably screen and/or stabilized for discharge and I doubt any other medical condition or other [facility] requiring further screening, evaluation, or treatment in the ED at this time prior to discharge.   Final Clinical Impressions(s) / ED Diagnoses   Final diagnoses:  Fecal impaction in rectum [facility])    New Prescriptions Discharge Medication List as of 04/13/2017  5:52 AM       Deno Etienne, DO 04/13/17 [id number]

## 2017-04-13 NOTE — ED Triage Notes (Signed)
Patient had a bowel movement this morning but complains that her bowels are not moving. Patient states she took suppositories and laxatives with no results this evening. Patient states she is having pain in her rectum.

## 2017-04-13 NOTE — Discharge Instructions (Signed)
TAKE 8 CAPFULS OF MIRALAX IN A 32 OUNCE GATORADE AND DRINK THE WHOLE BEVERAGE.  Also do a fleets enema. Return for abdominal pain fever or vomiting.

## 2017-05-03 DIAGNOSIS — H524 Presbyopia: Secondary | ICD-10-CM | POA: Diagnosis not present

## 2017-05-03 DIAGNOSIS — Z961 Presence of intraocular lens: Secondary | ICD-10-CM | POA: Diagnosis not present

## 2017-05-03 DIAGNOSIS — H04123 Dry eye syndrome of bilateral lacrimal glands: Secondary | ICD-10-CM | POA: Diagnosis not present

## 2017-05-03 DIAGNOSIS — H5213 Myopia, bilateral: Secondary | ICD-10-CM | POA: Diagnosis not present

## 2017-05-03 DIAGNOSIS — H35362 Drusen (degenerative) of macula, left eye: Secondary | ICD-10-CM | POA: Diagnosis not present

## 2017-05-03 DIAGNOSIS — H52223 Regular astigmatism, bilateral: Secondary | ICD-10-CM | POA: Diagnosis not present

## 2017-05-30 ENCOUNTER — Emergency Department (HOSPITAL_COMMUNITY): Payer: PPO

## 2017-05-30 ENCOUNTER — Encounter (HOSPITAL_COMMUNITY): Payer: Self-pay | Admitting: Emergency Medicine

## 2017-05-30 ENCOUNTER — Emergency Department (HOSPITAL_COMMUNITY)
Admission: EM | Admit: 2017-05-30 | Discharge: 2017-05-30 | Disposition: A | Discharge status: Home / Self Care | Payer: PPO | Attending: Emergency Medicine | Admitting: Emergency Medicine

## 2017-05-30 DIAGNOSIS — R0789 Other chest pain: Secondary | ICD-10-CM | POA: Diagnosis not present

## 2017-05-30 DIAGNOSIS — I10 Essential (primary) hypertension: Secondary | ICD-10-CM | POA: Diagnosis not present

## 2017-05-30 DIAGNOSIS — R5381 Other malaise: Secondary | ICD-10-CM | POA: Insufficient documentation

## 2017-05-30 DIAGNOSIS — Z79899 Other long term (current) drug therapy: Secondary | ICD-10-CM | POA: Diagnosis not present

## 2017-05-30 LAB — I-STAT TROPONIN, ED
TROPONIN I, POC: 0.01 ng/mL (ref 0.00–0.08)
Troponin i, poc: 0 ng/mL (ref 0.00–0.08)

## 2017-05-30 LAB — HEPATIC FUNCTION PANEL
ALK PHOS: 54 U/L (ref 38–126)
ALT: 14 U/L (ref 14–54)
AST: 23 U/L (ref 15–41)
Albumin: 4 g/dL (ref 3.5–5.0)
BILIRUBIN TOTAL: 0.5 mg/dL (ref 0.3–1.2)
Bilirubin, Direct: <0.1 mg/dL — ABNORMAL LOW (ref 0.1–0.5)
Total Protein: 7.6 g/dL (ref 6.5–8.1)

## 2017-05-30 LAB — CBC
HCT: 35.2 % — ABNORMAL LOW (ref 36.0–46.0)
Hemoglobin: 12 g/dL (ref 12.0–15.0)
MCH: 29.4 pg (ref 26.0–34.0)
MCHC: 34.1 g/dL (ref 30.0–36.0)
MCV: 86.3 fL (ref 78.0–100.0)
Platelets: 309 10*3/uL (ref 150–400)
RBC: 4.08 MIL/uL (ref 3.87–5.11)
RDW: 14.2 % (ref 11.5–15.5)
WBC: 7 10*3/uL (ref 4.0–10.5)

## 2017-05-30 LAB — URINALYSIS, ROUTINE W REFLEX MICROSCOPIC
Bilirubin Urine: NEGATIVE
Glucose, UA: NEGATIVE mg/dL
Hgb urine dipstick: NEGATIVE
Ketones, ur: NEGATIVE mg/dL
Nitrite: NEGATIVE
Protein, ur: NEGATIVE mg/dL
Specific Gravity, Urine: 1.014 (ref 1.005–1.030)
pH: 8 (ref 5.0–8.0)

## 2017-05-30 LAB — BASIC METABOLIC PANEL
ANION GAP: 11 (ref 5–15)
BUN: 8 mg/dL (ref 6–20)
CO2: 21 mmol/L — AB (ref 22–32)
Calcium: 9.6 mg/dL (ref 8.9–10.3)
Chloride: 110 mmol/L (ref 101–111)
Creatinine, Ser: 0.85 mg/dL (ref 0.44–1.00)
GFR calc Af Amer: >60 mL/min (ref >60)
GFR calc non Af Amer: >60 mL/min (ref >60)
GLUCOSE: 108 mg/dL — AB (ref 65–99)
POTASSIUM: 3.4 mmol/L — AB (ref 3.5–5.1)
Sodium: 142 mmol/L (ref 135–145)

## 2017-05-30 LAB — LIPASE, BLOOD: Lipase: 12 U/L (ref 11–51)

## 2017-05-30 MED ORDER — SODIUM CHLORIDE 0.9 % IV BOLUS (SEPSIS)
1000.0000 mL | Freq: Once | INTRAVENOUS | Status: AC
  Administered 2017-05-30: 1000 mL via INTRAVENOUS

## 2017-05-30 NOTE — ED Provider Notes (Signed)
[facility] DEPT Provider Note   CSN: [id number] Arrival date & time: 05/30/17  1035  History   Chief Complaint Chief Complaint  Patient presents with  . Chest Pain   Level 5 caveat for mild dementia. History from patient and chart review  HPI JESTINA STEPHANI is a 83 y.o. female with h/o GERD, esophageal stricture, lumbar radiculopathy, anxiety brought to ED for evaluation of "feeling bad" x 1 week. She endorses increased nervousness, states this is making her breathing harder. She woke up this morning feel more "bad" and anxious, called her daughter who called EMS.  States her PCP d/c her "nerve pills" and has ran out of her anxiety medications last week. Denies CP, SOB, nausea, vomiting, diarrhea, dysuria. Endorses dry mouth and decreased appetite. Anxiety has been ok, no recent stressors or increased anxiety. Also endorses some chronic complaints including back pain, difficulty swallowing, neuropathy in her legs, intermittent right leg "puffiness".  These complaints have been documented in her chart before by PCP. Talks of lumbar spine injections four years ago that have since caused her back pain, neck pain and left hand numbness. No changes in chronic complaints.     HPI  Past Medical History:  Diagnosis Date  . Allergy    seasonal  . Diverticulosis 2004  . Esophageal stricture   . GERD (gastroesophageal reflux disease)    has had EGD 2006-negative  . Helicobacter positive gastritis 2006  . Hypertension     On no medication. Home readings SBP=150's  . Internal hemorrhoids   . Irregular heart beat    no testing, holter, etc. Takes no medication  . Near syncope    diagnosed as anxiety related. gets some relief with clonazepam. No formal testing done.    Patient Active Problem List   Diagnosis Date Noted  . Left lumbar radiculopathy 06/05/2016  . Generalized anxiety disorder 04/18/2016  . Neck pain 04/18/2016  . Tinnitus 04/18/2016  . Varicose veins of both lower  extremities 03/11/2015  . Pain in the chest   . Chest pain 12/21/2014  . Insomnia 07/08/2014  . PVC's (premature ventricular contractions) 05/20/2014  . Murmur 05/20/2014  . Dysphagia 11/12/2013  . Routine health maintenance 10/14/2013  . Other and unspecified hyperlipidemia 10/13/2013  . Chronic back pain 10/13/2013  . 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 Paresthesia of lower extremity 01/08/2013  . Paresthesia of hand 06/18/2012  . Urticaria 08/28/2011  . Anxiety 08/19/2011  . Near syncope   . GERD (gastroesophageal reflux disease)   . Hypertension     Past Surgical History:  Procedure Laterality Date  . ABDOMINAL HYSTERECTOMY  1974   fibroid tumors w/ metorrhagia  . Robert.[facility] - NSVD  midwife delivery for two    . TONSILLECTOMY    . TONSILLECTOMY      OB History    No data available       Home Medications    Prior to Admission medications   Medication Sig Start Date End Date Taking? Authorizing Provider  diazepam (VALIUM) 5 MG tablet Take 1 tablet (5 mg total) by mouth every 8 (eight) hours as needed. Patient taking differently: Take 5 mg by mouth every 8 (eight) hours as needed for anxiety or muscle spasms.  04/10/17  Yes Gildardo Cranker, DO  DULoxetine (CYMBALTA) 30 MG capsule Take 1 capsule (30 mg total) by mouth daily. Patient taking differently: Take 30 mg by mouth daily after breakfast.  04/02/17  Yes [name]  K, NP  ergocalciferol (VITAMIN D2) 50000 units capsule Take 50,000 Units by mouth once a week.   Yes [provider]  esomeprazole (NEXIUM) 40 MG capsule Take 1 capsule (40 mg total) by mouth daily. Patient taking differently: Take 40 mg by mouth daily after breakfast.  09/12/16  Yes Eulas Post, Monica, DO  OVER THE COUNTER MEDICATION Apply 1 application topically daily. "[employer] - Moisturizing lotion"   Yes [provider]  PATADAY 0.2 % SOLN 1 drop in right eye every morning 07/19/15  Yes  [provider]  RESTASIS 0.05 % ophthalmic emulsion Place 1 drop into both eyes 2 (two) times daily.  12/20/14  Yes [provider]  traMADol (ULTRAM) 50 MG tablet Take 1 tablet (50 mg total) by mouth every 6 (six) hours as needed for moderate pain or severe pain. 04/01/17  Yes Reed, Tiffany L, DO  vitamin C (ASCORBIC ACID) 500 MG tablet Take 500 mg by mouth daily.   Yes [provider]  valsartan (DIOVAN) 80 MG tablet Take 1 tablet (80 mg total) by mouth daily. Patient not taking: Reported on 05/30/2017 11/28/16   Gildardo Cranker, DO    Family History Family History  Problem Relation Age of Onset  . Heart disease Mother   . Stroke Mother   . Hypertension Mother   . Cancer Father        lung cancer - smoker  . Heart disease Sister        MI  . Kidney disease Sister        end stage renal disease on HD  . Diabetes Sister   . Heart disease Brother   . Diabetes Sister   . Hypertension Sister   . Kidney disease Sister   . Cancer Sister   . Diabetes Sister   . Kidney disease Sister        HD  . Hypertension Sister   . Heart disease Sister   . Hypertension Sister   . Heart disease Sister   . Hypertension Sister   . Hypertension Sister     Social History Social History  Substance Use Topics  . Smoking status: Never Smoker  . Smokeless tobacco: Never Used  . Alcohol use No     Allergies   Clonazepam; Codeine; Lexapro [escitalopram oxalate]; and Aspirin   Review of Systems Review of Systems  Constitutional: Positive for appetite change. Negative for fever.       +"feeling bad"  HENT: Negative for congestion and sore throat.   Respiratory: Positive for choking (chronic). Negative for cough and shortness of breath.   Cardiovascular: Negative for chest pain.  Gastrointestinal: Negative for abdominal pain, constipation, diarrhea, nausea and vomiting.  Endocrine: Negative for polydipsia and polyuria.  Genitourinary: Negative for difficulty urinating,  dysuria and hematuria.  Musculoskeletal: Positive for back pain (chronic).  Skin: Negative for rash.  Neurological: Negative for light-headedness and headaches.  Psychiatric/Behavioral: The patient is nervous/anxious.      Physical Exam Updated Vital Signs BP (!) 177/88   Pulse 76   Temp 99.9 F (37.7 C) (Rectal)   Resp 17   Ht 5\' 1"  (1.549 m)   Wt 74.4 kg (164 lb)   SpO2 99%   BMI 30.99 kg/m   Physical Exam  Constitutional: She is oriented to person, place, and time. She appears well-developed and well-nourished. No distress.  Anxious appearing. Breathing fast with normal SpO2. Coached to take deep breaths and slow breathing down, pt able to do  sow. States she is nervous.  HENT:  Head: Normocephalic and atraumatic.  Nose: Nose normal.  Mouth/Throat: No oropharyngeal exudate.  Dry mucous membranes  Normal oropharynx and tonsils   Eyes: Pupils are equal, round, and reactive to light. Conjunctivae and EOM are normal.  Neck: Normal range of motion. Neck supple.  Cardiovascular: Normal rate, regular rhythm, normal heart sounds and intact distal pulses.   No murmur heard. Pulses:      Carotid pulses are 2+ on the right side, and 2+ on the left side.      Radial pulses are 2+ on the right side, and 2+ on the left side.       Dorsalis pedis pulses are 2+ on the right side, and 2+ on the left side.  Pulmonary/Chest: Effort normal and breath sounds normal. No respiratory distress. She has no decreased breath sounds. She has no wheezes. She has no rhonchi. She has no rales. She exhibits no tenderness.  Abdominal: Soft. Bowel sounds are normal. She exhibits no distension and no mass. There is no tenderness. There is no rebound and no guarding.  No suprapubic or CVA tenderness  Musculoskeletal: Normal range of motion. She exhibits no deformity.  Lymphadenopathy:    She has no cervical adenopathy.  Neurological: She is alert and oriented to person, place, and time. No sensory deficit.    Skin: Skin is warm and dry. Capillary refill takes less than 2 seconds.  Psychiatric: Her behavior is normal. Judgment and thought content normal. Her mood appears anxious. Her speech is tangential. She expresses no suicidal ideation. She expresses no suicidal plans.  Nursing note and vitals reviewed.    ED Treatments / Results  Labs (all labs ordered are listed, but only abnormal results are displayed) Labs Reviewed  BASIC METABOLIC PANEL - Abnormal; Notable for the following:       Result Value   Potassium 3.4 (*)    CO2 21 (*)    Glucose, Bld 108 (*)    All other components within normal limits  CBC - Abnormal; Notable for the following:    HCT 35.2 (*)    All other components within normal limits  HEPATIC FUNCTION PANEL - Abnormal; Notable for the following:    Bilirubin, Direct <0.1 (*)    All other components within normal limits  URINALYSIS, ROUTINE W REFLEX MICROSCOPIC - Abnormal; Notable for the following:    Leukocytes, UA TRACE (*)    Bacteria, UA RARE (*)    Squamous Epithelial / LPF 0-5 (*)    All other components within normal limits  URINE CULTURE  LIPASE, BLOOD  I-STAT TROPONIN, ED  I-STAT TROPONIN, ED    EKG  EKG Interpretation  Date/Time:  Thursday May 30 2017 10:46:09 EDT Ventricular Rate:  99 PR Interval:    QRS Duration: 86 QT Interval:  356 QTC Calculation: 457 R Axis:   64 Text Interpretation:  Sinus rhythm Borderline repolarization abnormality No significant change since last tracing Confirmed by Deno Etienne [phone]) on 05/30/2017 11:03:19 AM       Radiology Dg Chest 2 View  Result Date: 05/30/2017 CLINICAL DATA:  Chest tightness. EXAM: CHEST  2 VIEW COMPARISON:  01/12/2017 . FINDINGS: Mediastinum and hilar structures are normal. Heart size normal. No focal infiltrate. No pleural effusion or pneumothorax. Mild thoracic vertebral body stable compression fractures. No acute bony abnormality . IMPRESSION: No acute cardiopulmonary disease.  Electronically Signed   By: Marcello Moores  Register   On: 05/30/2017 11:30  Procedures Procedures (including critical care time)  Medications Ordered in ED Medications  sodium chloride 0.9 % bolus 1,000 mL (1,000 mLs Intravenous New Bag/Given 05/30/17 1159)  sodium chloride 0.9 % bolus 1,000 mL (1,000 mLs Intravenous New Bag/Given 05/30/17 1203)     Initial Impression / Assessment and Plan / ED Course  I have reviewed the triage vital signs and the nursing notes.  Pertinent labs & imaging results that were available during my care of the patient were reviewed by me and considered in my medical decision making (see chart for details).    83 yo female with /o GERD, esophageal stricture, lumbar radiculopathy, anxiety brought to ED for evaluation of "feeling bad" x 1 week, increased nervousness, dry mouth, decreased appetite. She is a difficult historian, tangential, reports chronic complaints that do not appear to have changed per chart review (usual complaints to PCP include back pain, anxiety, choking sensation, acid reflux, intermittent right leg swelling).  Exam reassuring, VS wnl and stable in ED. No rectal temperature. Given age, baseline dementia and vague symptoms will r/o systemic infection and CP work up.  Was in ED for CP march 2018, with cardiac work up negative. Echo 2016 with normal LVEF.   Upon re-evaluation pt  feels better after 2L IVF. She has tolerated PO and ambulated without assist with normal SpO2. Not orthostatic after IVF. Lab work reassuring. U/A clean. CBC and BMP w/o abnormalities. CXR, EKG, trop x 2 reassuring. Hx and exam not c/w PE.  Final Clinical Impressions(s) / ED Diagnoses   Final diagnoses:  Malaise   Patient appears in no acute distress. She is feeling better. Lab work so far reassuring. I do not think there is indication for further emergent lab work, imaging or hospital admission at this time. H/o of HTN only.  HEART score =2. On exam VS are wnl. Cardiovascular  and pulmonary exam benign. Patient has ambulated and tolerated PO in ED. Given symptoms, reassuring ED work up, low risk HEART score patient will be discharged with recommendation to follow up with PCP in regards to today's hospital visit. ED return preacutions given. Pt appears reliable for follow up and is agreeable to discharge.   New Prescriptions New Prescriptions   No medications on file      Arlean Hopping 05/30/17 [place], [place], DO 05/30/17 2045

## 2017-05-30 NOTE — ED Notes (Addendum)
Patient c/o sensation of trouble swallowing from mid sternum down that has ben going on for about 4 years when she had shot for her spine.  Patient also stated that she has left arm tingling and numbness for same length of time.  Patient reports having area on mid anterior lower leg that will become enlarged and having shooting pains to foot then will return to normal size and pains go away, patient reports that has been happening for about year.   Patient reports being "really nervous today and not feeling well" as to why she called her daughter.  Patient reports that she has been out/off one of her medications for "my nerves" since last week.

## 2017-05-30 NOTE — ED Notes (Signed)
Pt stayed at 98% during ambulation

## 2017-05-30 NOTE — ED Notes (Signed)
Unable to collect labs at this time RN and PA at bedside

## 2017-05-30 NOTE — ED Notes (Signed)
Patient transported to X-ray 

## 2017-05-30 NOTE — ED Triage Notes (Signed)
Chest pain for "awhile", states it has been worse over the last week, radiates into her neck.

## 2017-05-30 NOTE — ED Notes (Signed)
RN to start IV and collect blood work when pt gets back from [employer]

## 2017-05-30 NOTE — Discharge Instructions (Signed)
You were evaluated in the emergency department for generalized malaise and increased anxiousness. You also reported some long-standing concerns including back pain, regurgitation in your chest, right leg swelling intermittently. Your exam is reassuring. Your lab work, EKG, chest x-ray, cardiac enzymes, urinalysis were also reassuring. It is still unclear why you are feeling tired and generally bad, please contact your primary care provider as soon as possible for further discussion of your symptoms in ED visit today. Return to the emergency department for fevers, chest pain, shortness of breath, nausea, vomiting, abdominal pain, burning with urination. Continue taking all your home medications as prescribed.

## 2017-05-31 LAB — URINE CULTURE

## 2017-07-03 ENCOUNTER — Encounter: Payer: Self-pay | Admitting: Internal Medicine

## 2017-07-03 ENCOUNTER — Ambulatory Visit (INDEPENDENT_AMBULATORY_CARE_PROVIDER_SITE_OTHER): Payer: PPO | Admitting: Internal Medicine

## 2017-07-03 VITALS — BP 142/86 | HR 75 | Temp 98.4°F | Resp 18 | Ht 61.0 in | Wt 162.0 lb

## 2017-07-03 DIAGNOSIS — I10 Essential (primary) hypertension: Secondary | ICD-10-CM

## 2017-07-03 DIAGNOSIS — Z23 Encounter for immunization: Secondary | ICD-10-CM

## 2017-07-03 DIAGNOSIS — G894 Chronic pain syndrome: Secondary | ICD-10-CM

## 2017-07-03 DIAGNOSIS — K219 Gastro-esophageal reflux disease without esophagitis: Secondary | ICD-10-CM

## 2017-07-03 DIAGNOSIS — R1310 Dysphagia, unspecified: Secondary | ICD-10-CM | POA: Diagnosis not present

## 2017-07-03 DIAGNOSIS — I8393 Asymptomatic varicose veins of bilateral lower extremities: Secondary | ICD-10-CM

## 2017-07-03 DIAGNOSIS — F411 Generalized anxiety disorder: Secondary | ICD-10-CM | POA: Diagnosis not present

## 2017-07-03 MED ORDER — DULOXETINE HCL 30 MG PO CPEP: 30.0000 mg | ORAL_CAPSULE | Freq: Every day | ORAL | 6 refills | Status: DC

## 2017-07-03 MED ORDER — TELMISARTAN 40 MG PO TABS: 40.0000 mg | ORAL_TABLET | Freq: Every day | ORAL | 6 refills | Status: DC

## 2017-07-03 MED ORDER — DIAZEPAM 5 MG PO TABS: 5.0000 mg | ORAL_TABLET | Freq: Three times a day (TID) | ORAL | 0 refills | Status: DC | PRN

## 2017-07-03 NOTE — Patient Instructions (Addendum)
STOP VALSARTAN DUE TO NATIONAL RECALL OF DRUG  START MICARDIS 40MG  DAILY FOR BLOOD PRESSURE  Continue other medications as ordered  Will call with GI referral  Flu shot given today  Follow up in 3 mos for GAD, HTN

## 2017-07-03 NOTE — Progress Notes (Signed)
Patient ID: Alexandria Beck, female   DOB: Apr 03, 1931, 83 y.o.   MRN: [id number]    Location:  [facility] Place of Service: OFFICE  Chief Complaint  Patient presents with  . Medical Management of Chronic Issues    Pt is being seen for a 3 month routine visit for follow up on varicose veins and memory loss.   . Medication Management    Pt needs alternative to valsartan  . Medication Refill    controlled Rx pended.    HPI:  83 yo female seen today for f/u. She c/o dysphagia and sensation that she "is swallowing to the right side". She has had to have esophagus stretched before. She was taking nexium but switched to something else as she felt the nexium was not working. She is taking an OTC chewable medication which she cannot recall the name of. She is a poor historian due to memory loss. Hx obtained from chart.  Varicose veins RLE - unchanged. They become "puffy" with prolonged standing. Venous doppler US RLE in Feb 2018 neg for DVT.   GAD -  had a severe panic attack a several mos ago and EMS called. Heart checked out. She was not taken to ED. She tried higher dose of effexor xr which caused increased anxiety and she stopped after 1 dose. She was started on cymbalta 53m daily which helped but bottle did not have RF and she did not request any. no ADRs reported. She takes diazepam BID but does not feel that it works as well as the xanax. Clonopin caused swelling. Sleeping better overall. She feels occasional fatigue and anhedonia. Denies depression  HTN -  stable on diovan  GERD - sx's stable on carafate and nexium. She has constipation which is better on miralax  Hx lumbar spondylolisthesis/DDD/spinal stenosis - occasional back pain with neuropathy. Last MRI in 04/2013.  MRI L spine in Aug 2017 showed worsening arthritic changes in lumbar spine and severe spinal stenosis at L4-L5, bulging discs. No compression of spinal cord. She had epidural 05/18/13. IR did not feel she needed epidural in Oct 2017  when she saw them. CT C spine in 2013 revealed mild multilevel DDD; xray C spine in June 2017 revealed loss of C spine curvature, diffuse DDD from C4-C7 with some foraminal narrowing at C5-6 and C6-7.  Memory loss, short term - MMSE 27/30. Unchanged. She does not take any meds for cognition.    Past Medical History:  Diagnosis Date  . Allergy    seasonal  . Diverticulosis 2004  . Esophageal stricture   . GERD (gastroesophageal reflux disease)    has had EGD 2006-negative  . Helicobacter positive gastritis 2006  . Hypertension     On no medication. Home readings SBP=150's  . Internal hemorrhoids   . Irregular heart beat    no testing, holter, etc. Takes no medication  . Near syncope    diagnosed as anxiety related. gets some relief with clonazepam. No formal testing done.    Past Surgical History:  Procedure Laterality Date  . ABDOMINAL HYSTERECTOMY  1974   fibroid tumors w/ metorrhagia  . GRobert.Gallant- NSVD  midwife delivery for two    . TONSILLECTOMY    . TONSILLECTOMY      Patient Care Team: CGildardo Cranker DO as PCP - General (Internal Medicine) HHendricks Limes MD as Consulting Physician (Internal Medicine) BShirley MuscatSLoreen Freud MD as Referring Physician (Optometry)  Social History   Social History  . Marital status:  Widowed    Spouse name: N/A  . Number of children: 4  . Years of education: 54   Occupational History  . stock person     retired   Social History Main Topics  . Smoking status: Never Smoker  . Smokeless tobacco: Never Used  . Alcohol use No  . Drug use: No  . Sexual activity: Not Currently   Other Topics Concern  . Not on file   Social History Narrative   Diet:      Do you drink/ eat things with caffeine? yes      Marital status:   widow                            What year were you married ?  1953      Do you live in a house, apartment,assistred living, condo, trailer, etc.)? house      Is it one or more stories? 1 story      How  many persons live in your home ?2      Do you have any pets in your home ?(please list)  none      Current or past profession: [employer]      Do you exercise?  yes                            Type & how often:  walking      Do you have a living will?  no      Do you have a DNR form?     no                  If not, do you want to discuss one?       Do you have signed POA?HPOA forms?  no               If so, please bring to your        appointment                          reports that she has never smoked. She has never used smokeless tobacco. She reports that she does not drink alcohol or use drugs.  Family History  Problem Relation Age of Onset  . Heart disease Mother   . Stroke Mother   . Hypertension Mother   . Cancer Father        lung cancer - smoker  . Heart disease Sister        MI  . Kidney disease Sister        end stage renal disease on HD  . Diabetes Sister   . Heart disease Brother   . Diabetes Sister   . Hypertension Sister   . Kidney disease Sister   . Cancer Sister   . Diabetes Sister   . Kidney disease Sister        HD  . Hypertension Sister   . Heart disease Sister   . Hypertension Sister   . Heart disease Sister   . Hypertension Sister   . Hypertension Sister    Family Status  Relation Status  . Mother Deceased at age 77  . Father Deceased at age 3  . Sister Deceased  . Brother Deceased       CAD/MI  . Sister Deceased  . Sister Deceased  . Sister  Alive  . Sister Alive  . Sister Alive  . Sister Alive  . Son Alive  . Daughter Alive  . Son Alive  . Son Alive  . Sister (Not Specified)  . Brother (Not Specified)  . Sister (Not Specified)  . Sister (Not Specified)  . Sister (Not Specified)  . Sister (Not Specified)  . Sister (Not Specified)  . Sister (Not Specified)  . Sister (Not Specified)     Allergies  Allergen Reactions  . Clonazepam Swelling and Other (See Comments)    Tongue thickening sensation  . Codeine Rash  .  Lexapro [Escitalopram Oxalate] Other (See Comments)    Made her "feel funny"  . Aspirin Nausea And Vomiting and Other (See Comments)    Stomach upset    Medications: Patient's Medications  New Prescriptions   No medications on file  Previous Medications   DIAZEPAM (VALIUM) 5 MG TABLET    Take 1 tablet (5 mg total) by mouth every 8 (eight) hours as needed.   DULOXETINE (CYMBALTA) 30 MG CAPSULE    Take 1 capsule (30 mg total) by mouth daily.   ERGOCALCIFEROL (VITAMIN D2) 50000 UNITS CAPSULE    Take 50,000 Units by mouth once a week.   ESOMEPRAZOLE (NEXIUM) 40 MG CAPSULE    Take 1 capsule (40 mg total) by mouth daily.   OVER THE COUNTER MEDICATION    Apply 1 application topically daily. "Two Old Goats - Moisturizing lotion"   PATADAY 0.2 % SOLN    1 drop in right eye every morning   RESTASIS 0.05 % OPHTHALMIC EMULSION    Place 1 drop into both eyes 2 (two) times daily.    TRAMADOL (ULTRAM) 50 MG TABLET    Take 1 tablet (50 mg total) by mouth every 6 (six) hours as needed for moderate pain or severe pain.   VALSARTAN (DIOVAN) 80 MG TABLET    Take 1 tablet (80 mg total) by mouth daily.   VITAMIN C (ASCORBIC ACID) 500 MG TABLET    Take 500 mg by mouth daily.  Modified Medications   No medications on file  Discontinued Medications   No medications on file    Review of Systems  Unable to perform ROS: Other (memory loss)    Vitals:   07/03/17 1115  BP: (!) 142/86  Pulse: 75  Resp: 18  Temp: 98.4 F (36.9 C)  TempSrc: Oral  SpO2: 99%  Weight: 162 lb (73.5 kg)  Height: _0  (1.549 m)   Body mass index is 30.61 kg/m.  Physical Exam  Constitutional: She appears well-developed and well-nourished.  HENT:  Mouth/Throat: Oropharynx is clear and moist. No oropharyngeal exudate.  MMM; no oral thrush  Eyes: Pupils are equal, round, and reactive to light. No scleral icterus.  Neck: Neck supple. Carotid bruit is not present. No tracheal deviation present. No thyromegaly present.    Cardiovascular: Normal rate, regular rhythm and intact distal pulses.  Exam reveals no gallop and no friction rub.   Murmur (2/6 SEM --> carotid b/l) heard. RLE soft varicose veins with no redness or TTP. No LLE varicosities. No calf TTP b/l  Pulmonary/Chest: Effort normal and breath sounds normal. No stridor. No respiratory distress. She has no wheezes. She has no rales.  Abdominal: Soft. Normal appearance and bowel sounds are normal. She exhibits no distension and no mass. There is no hepatomegaly. There is no tenderness. There is no rigidity, no rebound and no guarding. No hernia.  Musculoskeletal: She exhibits edema.  Lymphadenopathy:    She has no cervical adenopathy.  Neurological: She is alert.  Skin: Skin is warm and dry. No rash noted.  Female balding  Psychiatric: She has a normal mood and affect. Her behavior is normal. Thought content normal.     Labs reviewed: Admission on 05/30/2017, Discharged on 05/30/2017  Component Date Value Ref Range Status  . Sodium 05/30/2017 142  135 - 145 mmol/L Final  . Potassium 05/30/2017 3.4* 3.5 - 5.1 mmol/L Final  . Chloride 05/30/2017 110  101 - 111 mmol/L Final  . CO2 05/30/2017 21* 22 - 32 mmol/L Final  . Glucose, Bld 05/30/2017 108* 65 - 99 mg/dL Final  . BUN 05/30/2017 8  6 - 20 mg/dL Final  . Creatinine, Ser 05/30/2017 0.85  0.44 - 1.00 mg/dL Final  . Calcium 05/30/2017 9.6  8.9 - 10.3 mg/dL Final  . GFR calc non Af Amer 05/30/2017 >60  >60 mL/min Final  . GFR calc Af Amer 05/30/2017 >60  >60 mL/min Final   Comment: (NOTE) The eGFR has been calculated using the CKD EPI equation. This calculation has not been validated in all clinical situations. eGFR's persistently <60 mL/min signify possible Chronic Kidney Disease.   . Anion gap 05/30/2017 11  5 - 15 Final  . WBC 05/30/2017 7.0  4.0 - 10.5 K/uL Final  . RBC 05/30/2017 4.08  3.87 - 5.11 MIL/uL Final  . Hemoglobin 05/30/2017 12.0  12.0 - 15.0 g/dL Final  . HCT 05/30/2017  35.2* 36.0 - 46.0 % Final  . MCV 05/30/2017 86.3  78.0 - 100.0 fL Final  . MCH 05/30/2017 29.4  26.0 - 34.0 pg Final  . MCHC 05/30/2017 34.1  30.0 - 36.0 g/dL Final  . RDW 05/30/2017 14.2  11.5 - 15.5 % Final  . Platelets 05/30/2017 309  150 - 400 K/uL Final  . Troponin i, poc 05/30/2017 0.00  0.00 - 0.08 ng/mL Final  . Comment 3 05/30/2017          Final   Comment: Due to the release kinetics of cTnI, a negative result within the first hours of the onset of symptoms does not rule out myocardial infarction with certainty. If myocardial infarction is still suspected, repeat the test at appropriate intervals.   . Total Protein 05/30/2017 7.6  6.5 - 8.1 g/dL Final  . Albumin 05/30/2017 4.0  3.5 - 5.0 g/dL Final  . AST 05/30/2017 23  15 - 41 U/L Final  . ALT 05/30/2017 14  14 - 54 U/L Final  . Alkaline Phosphatase 05/30/2017 54  38 - 126 U/L Final  . Total Bilirubin 05/30/2017 0.5  0.3 - 1.2 mg/dL Final  . Bilirubin, Direct 05/30/2017 <0.1* 0.1 - 0.5 mg/dL Final  . Indirect Bilirubin 05/30/2017 NOT CALCULATED  0.3 - 0.9 mg/dL Final  . Lipase 05/30/2017 12  11 - 51 U/L Final  . Color, Urine 05/30/2017 YELLOW  YELLOW Final  . APPearance 05/30/2017 CLEAR  CLEAR Final  . Specific Gravity, Urine 05/30/2017 1.014  1.005 - 1.030 Final  . pH 05/30/2017 8.0  5.0 - 8.0 Final  . Glucose, UA 05/30/2017 NEGATIVE  NEGATIVE mg/dL Final  . Hgb urine dipstick 05/30/2017 NEGATIVE  NEGATIVE Final  . Bilirubin Urine 05/30/2017 NEGATIVE  NEGATIVE Final  . Ketones, ur 05/30/2017 NEGATIVE  NEGATIVE mg/dL Final  . Protein, ur 05/30/2017 NEGATIVE  NEGATIVE mg/dL Final  . Nitrite 05/30/2017 NEGATIVE  NEGATIVE Final  . Leukocytes, UA 05/30/2017 TRACE* NEGATIVE Final  . RBC /  HPF 05/30/2017 0-5  0 - 5 RBC/hpf Final  . WBC, UA 05/30/2017 0-5  0 - 5 WBC/hpf Final  . Bacteria, UA 05/30/2017 RARE* NONE SEEN Final  . Squamous Epithelial / LPF 05/30/2017 0-5* NONE SEEN Final  . Specimen Description 05/30/2017  URINE, RANDOM   Final  . Special Requests 05/30/2017 NONE   Final  . Culture 05/30/2017 MULTIPLE SPECIES PRESENT, SUGGEST RECOLLECTION*  Final  . Report Status 05/30/2017 05/31/2017 FINAL   Final  . Troponin i, poc 05/30/2017 0.01  0.00 - 0.08 ng/mL Final  . Comment 3 05/30/2017          Final   Comment: Due to the release kinetics of cTnI, a negative result within the first hours of the onset of symptoms does not rule out myocardial infarction with certainty. If myocardial infarction is still suspected, repeat the test at appropriate intervals.     No results found.   Assessment/Plan   ICD-10-CM   1. Gastroesophageal reflux disease without esophagitis K21.9 Ambulatory referral to Gastroenterology  2. Generalized anxiety disorder F41.1 diazepam (VALIUM) 5 MG tablet    DULoxetine (CYMBALTA) 30 MG capsule  3. Dysphagia, unspecified type R13.10   4. Chronic pain syndrome G89.4    2/2 Lumbar spondylolisthesis, DDD, spinal stenosis  5. Varicose veins of both lower extremities I83.93   6. Essential hypertension I10 telmisartan (MICARDIS) 40 MG tablet  7. Need for immunization against influenza Z23 Flu Vaccine QUAD 6+ mos PF IM (Fluarix Quad PF)   STOP VALSARTAN DUE TO NATIONAL RECALL OF DRUG  START MICARDIS 40MG DAILY FOR BLOOD PRESSURE  Continue other medications as ordered  Reassurance given for varicose veins  Will call with GI referral  Flu shot given today  Follow up in 3 mos for GAD, HTN   Raeleigh Guinn S. Perlie Gold  [facility] [address] [place], [place] [address] [phone] Cell (Monday-Friday 8 AM - 5 PM) [phone] After 5 PM and follow prompts

## 2017-07-12 ENCOUNTER — Ambulatory Visit: Payer: PPO | Admitting: Internal Medicine

## 2017-07-30 ENCOUNTER — Encounter: Payer: Self-pay | Admitting: Gastroenterology

## 2017-07-30 ENCOUNTER — Ambulatory Visit (INDEPENDENT_AMBULATORY_CARE_PROVIDER_SITE_OTHER): Payer: PPO | Admitting: Gastroenterology

## 2017-07-30 VITALS — BP 112/64 | HR 70 | Ht 61.0 in | Wt 163.0 lb

## 2017-07-30 DIAGNOSIS — K219 Gastro-esophageal reflux disease without esophagitis: Secondary | ICD-10-CM | POA: Diagnosis not present

## 2017-07-30 DIAGNOSIS — R1310 Dysphagia, unspecified: Secondary | ICD-10-CM | POA: Diagnosis not present

## 2017-07-30 NOTE — Patient Instructions (Signed)
Follow up as needed.  If you are age 81 or older, your body mass index should be between 23-30. Your Body mass index is 30.8 kg/m. If this is out of the aforementioned range listed, please consider follow up with your Primary Care Provider.  If you are age 23 or younger, your body mass index should be between 19-25. Your Body mass index is 30.8 kg/m. If this is out of the aformentioned range listed, please consider follow up with your Primary Care Provider.   Thank you for choosing [facility]  Dr Wilfrid Lund III

## 2017-07-30 NOTE — Progress Notes (Signed)
     [facility] Progress Note  Chief Complaint: Dysphagia  Subjective  History:  This is follow-up for an 83 year old woman who I last saw in April 2017 for left-sided throat discomfort and intermittent dysphagia. Erlean continues to describe it as a left-sided discomfort and fullness were sometimes thinks feel "hung up". It has been somewhat worse in about the last 6 months. She has a good appetite and denies weight loss. She denies odynophagia, nausea or vomiting. She has no heartburn or regurgitation as long she takes her PPI.  ROS: Cardiovascular:  no chest pain Respiratory: no dyspnea  The patient's Past Medical, Family and Social History were reviewed and are on file in the EMR.  Objective:  Med list reviewed  Current Outpatient Prescriptions:  .  diazepam (VALIUM) 5 MG tablet, Take 1 tablet (5 mg total) by mouth every 8 (eight) hours as needed for anxiety., Disp: 90 tablet, Rfl: 0 .  DULoxetine (CYMBALTA) 30 MG capsule, Take 1 capsule (30 mg total) by mouth daily. For anxiety, Disp: 30 capsule, Rfl: 6 .  ergocalciferol (VITAMIN D2) 50000 units capsule, Take 50,000 Units by mouth once a week., Disp: , Rfl:  .  esomeprazole (NEXIUM) 40 MG capsule, Take 1 capsule (40 mg total) by mouth daily., Disp: 30 capsule, Rfl: 3 .  OVER THE COUNTER MEDICATION, Apply 1 application topically daily. "Two Old Goats - Moisturizing lotion", Disp: , Rfl:  .  PATADAY 0.2 % SOLN, 1 drop in right eye every morning, Disp: , Rfl: 4 .  RESTASIS 0.05 % ophthalmic emulsion, Place 1 drop into both eyes 2 (two) times daily. , Disp: , Rfl:  .  telmisartan (MICARDIS) 40 MG tablet, Take 1 tablet (40 mg total) by mouth daily. For blood pressure, Disp: 30 tablet, Rfl: 6 .  traMADol (ULTRAM) 50 MG tablet, Take 1 tablet (50 mg total) by mouth every 6 (six) hours as needed for moderate pain or severe pain., Disp: 120 tablet, Rfl: 0 .  vitamin C (ASCORBIC ACID) 500 MG tablet, Take 500 mg by mouth daily., Disp: ,  Rfl:    Vital signs in last 24 hrs: Vitals:   07/30/17 1534  BP: 112/64  Pulse: 70    Physical Exam  Normal vocal quality, no palpable abdomen that is in the neck  HEENT: sclera anicteric, oral mucosa moist without lesions  Neck: supple, no thyromegaly, JVD or lymphadenopathy  Cardiac: RRR without murmurs, S1S2 heard, no peripheral edema  Pulm: clear to auscultation bilaterally, normal RR and effort noted  Abdomen: soft, No tenderness, with active bowel sounds. No guarding or palpable hepatosplenomegaly.  Skin; warm and dry, no jaundice or rash     @ASSESSMENTPLANBEGIN @ Assessment: Encounter Diagnoses  Name Primary?  [name] Dysphagia, unspecified type Yes  . Gastroesophageal reflux disease without esophagitis     This still sounds most like a cricopharyngeal hypertrophy/UES dysfunction rather than a fixed mechanical lesion such as a stricture. I offered her an upper endoscopy to evaluate and perform a dilation if any amenable lesion was found. She feels that she can live with this and is reassured after our discussion today. She would like to monitor it and let me know if he decides to do anything further.  Total time 15 minutes, over half spent in counseling and coordination of care.   Nelida Meuse III

## 2017-08-21 ENCOUNTER — Ambulatory Visit: Payer: PPO

## 2017-08-28 ENCOUNTER — Ambulatory Visit (INDEPENDENT_AMBULATORY_CARE_PROVIDER_SITE_OTHER): Payer: PPO | Admitting: Nurse Practitioner

## 2017-08-28 ENCOUNTER — Encounter: Payer: Self-pay | Admitting: Nurse Practitioner

## 2017-08-28 ENCOUNTER — Ambulatory Visit
Admission: RE | Admit: 2017-08-28 | Discharge: 2017-08-28 | Disposition: A | Discharge status: Home / Self Care | Payer: PPO | Source: Ambulatory Visit | Attending: Nurse Practitioner | Admitting: Nurse Practitioner

## 2017-08-28 VITALS — BP 122/78 | HR 93 | Temp 98.5°F | Resp 17 | Ht 61.0 in | Wt 161.0 lb

## 2017-08-28 DIAGNOSIS — M542 Cervicalgia: Secondary | ICD-10-CM | POA: Diagnosis not present

## 2017-08-28 MED ORDER — PREDNISONE 10 MG (21) PO TBPK: ORAL_TABLET | ORAL | 0 refills | Status: DC

## 2017-08-28 NOTE — Progress Notes (Signed)
Careteam: Patient Care Team: Gildardo Cranker, DO as PCP - General (Internal Medicine) Hendricks Limes, MD as Consulting Physician (Internal Medicine) Shirley Muscat Loreen Freud, MD as Referring Physician (Optometry)  Advanced Directive information Does Patient Have a Medical Advance Directive?: No  Allergies  Allergen Reactions  . Clonazepam Swelling and Other (See Comments)    Tongue thickening sensation  . Codeine Rash  . Lexapro [Escitalopram Oxalate] Other (See Comments)    Made her "feel funny"  . Aspirin Nausea And Vomiting and Other (See Comments)    Stomach upset    Chief Complaint  Patient presents with  . Acute Visit    Pt is being seen due to soreness/pain in upper left chest/shoulder area for several months. Pt reports that soreness runs to left elbow, up the neck to head, and down the back to hip.     HPI: Patient is a 83 y.o. female seen in the office today due to arm pain/sorness that starts in neck Hx lumbar spondylolisthesis/DDD/spinal stenosis - Last MRI in 04/2013. MRI L spine in Aug 2017 showed worsening arthritic changes in lumbar spine and severe spinal stenosis at L4-L5, bulging discs. No compression of spinal cord. She had epidural 05/18/13. IR did not feel she needed epidural in Oct 2017 when she saw them. CT C spine in 2013 revealed mild multilevel DDD; xray C spine in June 2017 due to left sided neck pain revealed loss of C spine curvature, diffuse DDD from C4-C7 with some foraminall narrowing at C5-6 and C6-7.  Reports she is sore. Pain in her neck started about a week or 2. Soreness starts in the back of her head and goes to the back of her neck and down to her shoulder. Goes down as far as her elbow and then hand tingles- this started a week or 2 ago.  Used to follow up with a neurosurgery due to complex spinal history.  Feels like she is put off a lot by that office, every time she called they said they would get back to her in 24-48 hours and then they  never got back with her. This happened twice.   Pain is not/ "too bad" bothers her enough that it does effect her sleep. Then rates pain 8/10, worse at bedtime. Using some OTC but she does not take regularly.   Review of Systems:  Review of Systems  Constitutional: Negative for chills, fever and weight loss.  HENT: Negative for tinnitus.   Respiratory: Negative for cough, sputum production and shortness of breath.   Cardiovascular: Negative for chest pain, palpitations and leg swelling.  Musculoskeletal: Positive for back pain, myalgias and neck pain. Negative for falls and joint pain.  Skin: Negative.   Neurological: Positive for tingling. Negative for dizziness, weakness and headaches.  Psychiatric/Behavioral: Positive for memory loss. Negative for depression.    Past Medical History:  Diagnosis Date  . Allergy    seasonal  . Diverticulosis 2004  . Esophageal stricture   . GERD (gastroesophageal reflux disease)    has had EGD 2006-negative  . Helicobacter positive gastritis 2006  . Hypertension     On no medication. Home readings SBP=150's  . Internal hemorrhoids   . Irregular heart beat    no testing, holter, etc. Takes no medication  . Near syncope    diagnosed as anxiety related. gets some relief with clonazepam. No formal testing done.   Past Surgical History:  Procedure Laterality Date  . ABDOMINAL HYSTERECTOMY  1974  fibroid tumors w/ metorrhagia  . Robert.Gallant - NSVD  midwife delivery for two    . TONSILLECTOMY    . TONSILLECTOMY     Social History:   reports that she has never smoked. She has never used smokeless tobacco. She reports that she does not drink alcohol or use drugs.  Family History  Problem Relation Age of Onset  . Heart disease Mother   . Stroke Mother   . Hypertension Mother   . Cancer Father        lung cancer - smoker  . Heart disease Sister        MI  . Kidney disease Sister        end stage renal disease on HD  . Diabetes Sister   . Heart  disease Brother   . Diabetes Sister   . Hypertension Sister   . Kidney disease Sister   . Cancer Sister   . Diabetes Sister   . Kidney disease Sister        HD  . Hypertension Sister   . Heart disease Sister   . Hypertension Sister   . Heart disease Sister   . Hypertension Sister   . Hypertension Sister     Medications: Patient's Medications  New Prescriptions   No medications on file  Previous Medications   DIAZEPAM (VALIUM) 5 MG TABLET    Take 1 tablet (5 mg total) by mouth every 8 (eight) hours as needed for anxiety.   DULOXETINE (CYMBALTA) 30 MG CAPSULE    Take 1 capsule (30 mg total) by mouth daily. For anxiety   ERGOCALCIFEROL (VITAMIN D2) 50000 UNITS CAPSULE    Take 50,000 Units by mouth once a week.   ESOMEPRAZOLE (NEXIUM) 40 MG CAPSULE    Take 1 capsule (40 mg total) by mouth daily.   OVER THE COUNTER MEDICATION    Apply 1 application topically daily. "Two Old Goats - Moisturizing lotion"   PATADAY 0.2 % SOLN    1 drop in right eye every morning   RESTASIS 0.05 % OPHTHALMIC EMULSION    Place 1 drop into both eyes 2 (two) times daily.    TELMISARTAN (MICARDIS) 40 MG TABLET    Take 1 tablet (40 mg total) by mouth daily. For blood pressure   TRAMADOL (ULTRAM) 50 MG TABLET    Take 1 tablet (50 mg total) by mouth every 6 (six) hours as needed for moderate pain or severe pain.   VITAMIN C (ASCORBIC ACID) 500 MG TABLET    Take 500 mg by mouth daily.  Modified Medications   No medications on file  Discontinued Medications   No medications on file     Physical Exam:  Vitals:   08/28/17 1455  BP: 122/78  Pulse: 93  Resp: 17  Temp: 98.5 F (36.9 C)  TempSrc: Oral  SpO2: 98%  Weight: 161 lb (73 kg)  Height: 5\' 1"  (1.549 m)   Body mass index is 30.42 kg/m.  Physical Exam  Constitutional: She appears well-developed and well-nourished.  HENT:  Mouth/Throat: Oropharynx is clear and moist. No oropharyngeal exudate.  MMM; no oral thrush  Eyes: Pupils are equal,  round, and reactive to light. No scleral icterus.  Neck: Normal range of motion. Neck supple. Carotid bruit is not present.  Cardiovascular: Normal rate and regular rhythm.  Exam reveals no gallop and no friction rub.   Murmur (2/6 SEM --> carotid b/l) heard. RLE soft varicose veins with no redness or TTP. No LLE  varicosities. No calf TTP b/l  Pulmonary/Chest: Effort normal and breath sounds normal. She has no rales.  Abdominal: Normal appearance. There is no hepatomegaly. There is no rigidity.  Musculoskeletal:       Left shoulder: Normal. She exhibits normal range of motion and no tenderness.       Cervical back: She exhibits tenderness. She exhibits normal range of motion, no swelling and no edema.       Back:  Lymphadenopathy:    She has no cervical adenopathy.  Neurological: She is alert. She has normal reflexes. She displays normal reflexes. No cranial nerve deficit. Coordination normal.  Normal monofilament to bilateral hands  Skin: Skin is warm and dry. No rash noted. No erythema.  Female balding  Psychiatric: She has a normal mood and affect. Her behavior is normal. Thought content normal.    Labs reviewed: Basic Metabolic Panel:  Recent Labs  01/12/17 0740 04/02/17 1030 05/30/17 1111  NA 140 142 142  K 3.5 5.1 3.4*  CL 105 107 110  CO2 23 21 21*  GLUCOSE 102* 90 108*  BUN 8 14 8   CREATININE 0.80 0.89* 0.85  CALCIUM 9.0 9.3 9.6  TSH  --  0.81  --    Liver Function Tests:  Recent Labs  11/28/16 1012 04/02/17 1030 05/30/17 1110  AST  --  29 23  ALT 14 12 14   ALKPHOS  --  50 54  BILITOT  --  0.4 0.5  PROT  --  7.1 7.6  ALBUMIN  --  3.8 4.0    Recent Labs  05/30/17 1110  LIPASE 12   No results for input(s): AMMONIA in the last 8760 hours. CBC:  Recent Labs  01/12/17 0740 04/02/17 1030 05/30/17 1111  WBC 5.8 5.8 7.0  NEUTROABS  --  2,030  --   HGB 10.4* 11.2* 12.0  HCT 31.7* 35.5 35.2*  MCV 84.8 86.2 86.3  PLT 284 364 309   Lipid  Panel: No results for input(s): CHOL, HDL, LDLCALC, TRIG, CHOLHDL, LDLDIRECT in the last 8760 hours. TSH:  Recent Labs  04/02/17 1030  TSH 0.81   A1C: Lab Results  Component Value Date   HGBA1C 6.1 04/09/2014     Assessment/Plan 1. Neck pain -sensation and strength intact, imagining of c spine in 201\ revealed loss of curvature and diffuse DDD with some narrowing at C 5-6 and 6-7, will follow up xray at this time. -may use heat to effected area TID with muscle rub after heat - predniSONE (STERAPRED UNI-PAK 21 TAB) 10 MG (21) TBPK tablet; Use as directed  Dispense: 21 tablet; Refill: 0 - DG Cervical Spine Complete; Future  Next appt: as scheduled, sooner if pain is worsening or does not improve with treatment.  Carlos American. Harle Battiest  [facility] [phone] 8 am - 5 pm) [phone] (after hours)

## 2017-08-28 NOTE — Patient Instructions (Signed)
To go to [place] imagining and get xray of neck To use prednisone dose pack  May use heat to neck three times daily  To use muscle rub after heat

## 2017-09-17 ENCOUNTER — Ambulatory Visit (INDEPENDENT_AMBULATORY_CARE_PROVIDER_SITE_OTHER): Payer: PPO

## 2017-09-17 VITALS — BP 128/82 | HR 92 | Temp 98.2°F | Ht 61.0 in | Wt 164.0 lb

## 2017-09-17 DIAGNOSIS — Z13820 Encounter for screening for osteoporosis: Secondary | ICD-10-CM | POA: Diagnosis not present

## 2017-09-17 DIAGNOSIS — Z0000 Encounter for general adult medical examination without abnormal findings: Secondary | ICD-10-CM | POA: Diagnosis not present

## 2017-09-17 MED ORDER — ZOSTER VAC RECOMB ADJUVANTED 50 MCG/0.5ML IM SUSR: 0.5000 mL | Freq: Once | INTRAMUSCULAR | 1 refills | Status: AC

## 2017-09-17 NOTE — Progress Notes (Signed)
Subjective:   Alexandria Beck is a 83 y.o. female who presents for Medicare Annual (Subsequent) preventive examination.  Last AWV-10/14/2023    Objective:     Vitals: BP 128/82 (BP Location: Left Arm, Patient Position: Sitting)   Pulse 92   Temp 98.2 F (36.8 C) (Oral)   Ht 5\' 1"  (1.549 m)   Wt 164 lb (74.4 kg)   SpO2 98%   BMI 30.99 kg/m   Body mass index is 30.99 kg/m.   Tobacco Social History   Tobacco Use  Smoking Status Never Smoker  Smokeless Tobacco Never Used     Counseling given: Not Answered   Past Medical History:  Diagnosis Date  . Allergy    seasonal  . Diverticulosis 2004  . Esophageal stricture   . GERD (gastroesophageal reflux disease)    has had EGD 2006-negative  . Helicobacter positive gastritis 2006  . Hypertension     On no medication. Home readings SBP=150's  . Internal hemorrhoids   . Irregular heart beat    no testing, holter, etc. Takes no medication  . Near syncope    diagnosed as anxiety related. gets some relief with clonazepam. No formal testing done.   Past Surgical History:  Procedure Laterality Date  . ABDOMINAL HYSTERECTOMY  1974   fibroid tumors w/ metorrhagia  . Robert.Gallant - NSVD  midwife delivery for two    . TONSILLECTOMY    . TONSILLECTOMY     Family History  Problem Relation Age of Onset  . Heart disease Mother   . Stroke Mother   . Hypertension Mother   . Cancer Father        lung cancer - smoker  . Heart disease Sister        MI  . Kidney disease Sister        end stage renal disease on HD  . Diabetes Sister   . Heart disease Brother   . Diabetes Sister   . Hypertension Sister   . Kidney disease Sister   . Cancer Sister   . Diabetes Sister   . Kidney disease Sister        HD  . Hypertension Sister   . Heart disease Sister   . Hypertension Sister   . Heart disease Sister   . Hypertension Sister   . Hypertension Sister    Social History   Substance and Sexual Activity  Sexual Activity Not Currently      Outpatient Encounter Medications as of 09/17/2017  Medication Sig  . diazepam (VALIUM) 5 MG tablet Take 1 tablet (5 mg total) by mouth every 8 (eight) hours as needed for anxiety.  . DULoxetine (CYMBALTA) 30 MG capsule Take 1 capsule (30 mg total) by mouth daily. For anxiety  . ergocalciferol (VITAMIN D2) 50000 units capsule Take 50,000 Units by mouth once a week.  . esomeprazole (NEXIUM) 40 MG capsule Take 1 capsule (40 mg total) by mouth daily.  [name] OVER THE COUNTER MEDICATION Apply 1 application topically daily. "Two Old Goats - Moisturizing lotion"  . PATADAY 0.2 % SOLN 1 drop in right eye every morning  . predniSONE (STERAPRED UNI-PAK 21 TAB) 10 MG (21) TBPK tablet Use as directed  . RESTASIS 0.05 % ophthalmic emulsion Place 1 drop into both eyes 2 (two) times daily.   [name] telmisartan (MICARDIS) 40 MG tablet Take 1 tablet (40 mg total) by mouth daily. For blood pressure  . traMADol (ULTRAM) 50 MG tablet Take 1 tablet (50 mg  total) by mouth every 6 (six) hours as needed for moderate pain or severe pain.  . vitamin C (ASCORBIC ACID) 500 MG tablet Take 500 mg by mouth daily.  [name] Zoster Vaccine Adjuvanted [facility]) injection Inject 0.5 mLs once for 1 dose into the muscle.  . [DISCONTINUED] Zoster Vaccine Adjuvanted [facility]) injection Inject 0.5 mLs once into the muscle.   No facility-administered encounter medications on file as of 09/17/2017.     Activities of Daily Living In your present state of health, do you have any difficulty performing the following activities: 09/17/2017  Hearing? N  Vision? N  Difficulty concentrating or making decisions? N  Walking or climbing stairs? N  Dressing or bathing? N  Doing errands, shopping? N  Preparing Food and eating ? N  Using the Toilet? N  In the past six months, have you accidently leaked urine? Y  Comment urinary urgency  Do you have problems with loss of bowel control? N  Managing your Medications? N  Managing your Finances? N   Housekeeping or managing your Housekeeping? N  Some recent data might be hidden    Patient Care Team: Gildardo Cranker, DO as PCP - General (Internal Medicine) [name] Darrick Penna, MD as Consulting Physician (Internal Medicine) Calton Dach, MD as Referring Physician (Optometry)    Assessment:     Exercise Activities and Dietary recommendations Current Exercise Habits: Home exercise routine, Type of exercise: walking, Time (Minutes): 20, Frequency (Times/Week): 1, Weekly Exercise (Minutes/Week): 20, Intensity: Mild, Exercise limited by: None identified  Goals    . Exercise 3x per week (20 min per time)     Paitent will walk a couple blocks a day      Fall Risk Fall Risk  09/17/2017 08/28/2017 07/03/2017 04/10/2017 04/02/2017  Falls in the past year? Yes Yes Yes No No  Number falls in past yr: 1 1 1  - -  Injury with Fall? No No No - -   Depression Screen PHQ 2/9 Scores 09/17/2017 09/12/2016 04/18/2016 11/22/2014  PHQ - 2 Score 0 0 0 2  PHQ- 9 Score - - - 5     Cognitive Function MMSE - Mini Mental State Exam 04/02/2017 04/02/2017 09/12/2016  Orientation to time 5 5 5   Orientation to Place 5 5 5   Registration 3 3 3   Attention/ Calculation 4 4 1   Recall 2 2 3   Language- name 2 objects 2 2 2   Language- repeat 1 1 1   Language- follow 3 step command 3 3 3   Language- read & follow direction 1 1 1   Write a sentence 1 1 1   Copy design 0 0 0  Total score 27 27 25         Immunization History  Administered Date(s) Administered  . Influenza Split 07/30/2011  . Influenza,inj,Quad PF,6+ Mos 07/13/2014, 06/20/2016, 07/03/2017  . Pneumococcal Conjugate-13 09/23/2013  . Pneumococcal Polysaccharide-23 09/12/2016  . Zoster 09/29/2015   Screening Tests Health Maintenance  Topic Date Due  . FOOT EXAM  06/23/1941  . HEMOGLOBIN A1C  10/09/2014  . DEXA SCAN  10/30/2023 (Originally 06/22/1998)  . OPHTHALMOLOGY EXAM  03/29/2018  . TETANUS/TDAP  12/28/2018  . INFLUENZA VACCINE   Completed  . PNA vac Low Risk Adult  Completed      Plan:    I have personally reviewed and addressed the Medicare Annual Wellness questionnaire and have noted the following in the patient's chart:  A. Medical and social history B. Use of alcohol, tobacco or illicit drugs  C.  Current medications and supplements D. Functional ability and status E.  Nutritional status F.  Physical activity G. Advance directives H. List of other physicians I.  Hospitalizations, surgeries, and ER visits in previous 12 months J.  [place] to include hearing, vision, cognitive, depression L. Referrals and appointments -DEXA  In addition, I have reviewed and discussed with patient certain preventive protocols, quality metrics, and best practice recommendations. A written personalized care plan for preventive services as well as general preventive health recommendations were provided to patient.  See attached scanned questionnaire for additional information.   Signed,   Rich Reining, RN Nurse Health Advisor   Quick Notes   Health Maintenance: Shingrix vaccine sent to pharmacy.     Abnormal Screen: MMSE 27/30     Patient Concerns: "Cricket sound" in left ear, review nexium with pt     Nurse Concerns: None

## 2017-09-17 NOTE — Addendum Note (Signed)
Addended by: Rich Reining E on: 09/17/2017 10:24 AM   Modules accepted: Miquel Dunn

## 2017-09-17 NOTE — Patient Instructions (Signed)
[name] , Thank you for taking time to come for your Medicare Wellness Visit. I appreciate your ongoing commitment to your health goals. Please review the following plan we discussed and let me know if I can assist you in the future.   Screening recommendations/referrals: Colonoscopy excluded, you are over age 81 Mammogram excluded, you are over age 20 Bone Density due, referral sent Recommended yearly ophthalmology/optometry visit for glaucoma screening and checkup Recommended yearly dental visit for hygiene and checkup  Vaccinations: Influenza vaccine up to date. Due 2019 fall season Pneumococcal vaccine up to date Tdap vaccine up to date. Due 12/28/2018 Shingles vaccine due, prescription sent to pharmacy    Advanced directives: Advance directive discussed with you today. I have provided a copy for you to complete at home and have notarized. Once this is complete please bring a copy in to our office so we can scan it into your chart.   Conditions/risks identified: None  Next appointment: Dr. Eulas Post 10/02/2017 @ 9:15am    Rich Reining, RN 10/03/2018 @ 10am   Preventive Care 53 Years and Older, Female Preventive care refers to lifestyle choices and visits with your health care provider that can promote health and wellness. What does preventive care include?  A yearly physical exam. This is also called an annual well check.  Dental exams once or twice a year.  Routine eye exams. Ask your health care provider how often you should have your eyes checked.  Personal lifestyle choices, including:  Daily care of your teeth and gums.  Regular physical activity.  Eating a healthy diet.  Avoiding tobacco and drug use.  Limiting alcohol use.  Practicing safe sex.  Taking low-dose aspirin every day.  Taking vitamin and mineral supplements as recommended by your health care provider. What happens during an annual well check? The services and screenings done by your health care  provider during your annual well check will depend on your age, overall health, lifestyle risk factors, and family history of disease. Counseling  Your health care provider may ask you questions about your:  Alcohol use.  Tobacco use.  Drug use.  Emotional well-being.  Home and relationship well-being.  Sexual activity.  Eating habits.  History of falls.  Memory and ability to understand (cognition).  Work and work Statistician.  Reproductive health. Screening  You may have the following tests or measurements:  Height, weight, and BMI.  Blood pressure.  Lipid and cholesterol levels. These may be checked every 5 years, or more frequently if you are over 20 years old.  Skin check.  Lung cancer screening. You may have this screening every year starting at age 55 if you have a 30-pack-year history of smoking and currently smoke or have quit within the past 15 years.  Fecal occult blood test (FOBT) of the stool. You may have this test every year starting at age 40.  Flexible sigmoidoscopy or colonoscopy. You may have a sigmoidoscopy every 5 years or a colonoscopy every 10 years starting at age 67.  Hepatitis C blood test.  Hepatitis B blood test.  Sexually transmitted disease (STD) testing.  Diabetes screening. This is done by checking your blood sugar (glucose) after you have not eaten for a while (fasting). You may have this done every 1-3 years.  Bone density scan. This is done to screen for osteoporosis. You may have this done starting at age 65.  Mammogram. This may be done every 1-2 years. Talk to your health care provider about how  often you should have regular mammograms. Talk with your health care provider about your test results, treatment options, and if necessary, the need for more tests. Vaccines  Your health care provider may recommend certain vaccines, such as:  Influenza vaccine. This is recommended every year.  Tetanus, diphtheria, and acellular  pertussis (Tdap, Td) vaccine. You may need a Td booster every 10 years.  Zoster vaccine. You may need this after age 83.  Pneumococcal 13-valent conjugate (PCV13) vaccine. One dose is recommended after age 59.  Pneumococcal polysaccharide (PPSV23) vaccine. One dose is recommended after age 34. Talk to your health care provider about which screenings and vaccines you need and how often you need them. This information is not intended to replace advice given to you by your health care provider. Make sure you discuss any questions you have with your health care provider. Document Released: 11/11/2015 Document Revised: 07/04/2016 Document Reviewed: 08/16/2015 Elsevier Interactive Patient Education  2017 [place] Prevention in the Home Falls can cause injuries. They can happen to people of all ages. There are many things you can do to make your home safe and to help prevent falls. What can I do on the outside of my home?  Regularly fix the edges of walkways and driveways and fix any cracks.  Remove anything that might make you trip as you walk through a door, such as a raised step or threshold.  Trim any bushes or trees on the path to your home.  Use bright outdoor lighting.  Clear any walking paths of anything that might make someone trip, such as rocks or tools.  Regularly check to see if handrails are loose or broken. Make sure that both sides of any steps have handrails.  Any raised decks and porches should have guardrails on the edges.  Have any leaves, snow, or ice cleared regularly.  Use sand or salt on walking paths during winter.  Clean up any spills in your garage right away. This includes oil or grease spills. What can I do in the bathroom?  Use night lights.  Install grab bars by the toilet and in the tub and shower. Do not use towel bars as grab bars.  Use non-skid mats or decals in the tub or shower.  If you need to sit down in the shower, use a plastic,  non-slip stool.  Keep the floor dry. Clean up any water that spills on the floor as soon as it happens.  Remove soap buildup in the tub or shower regularly.  Attach bath mats securely with double-sided non-slip rug tape.  Do not have throw rugs and other things on the floor that can make you trip. What can I do in the bedroom?  Use night lights.  Make sure that you have a light by your bed that is easy to reach.  Do not use any sheets or blankets that are too big for your bed. They should not hang down onto the floor.  Have a firm chair that has side arms. You can use this for support while you get dressed.  Do not have throw rugs and other things on the floor that can make you trip. What can I do in the kitchen?  Clean up any spills right away.  Avoid walking on wet floors.  Keep items that you use a lot in easy-to-reach places.  If you need to reach something above you, use a strong step stool that has a grab bar.  Keep electrical  cords out of the way.  Do not use floor polish or wax that makes floors slippery. If you must use wax, use non-skid floor wax.  Do not have throw rugs and other things on the floor that can make you trip. What can I do with my stairs?  Do not leave any items on the stairs.  Make sure that there are handrails on both sides of the stairs and use them. Fix handrails that are broken or loose. Make sure that handrails are as long as the stairways.  Check any carpeting to make sure that it is firmly attached to the stairs. Fix any carpet that is loose or worn.  Avoid having throw rugs at the top or bottom of the stairs. If you do have throw rugs, attach them to the floor with carpet tape.  Make sure that you have a light switch at the top of the stairs and the bottom of the stairs. If you do not have them, ask someone to add them for you. What else can I do to help prevent falls?  Wear shoes that:  Do not have high heels.  Have rubber  bottoms.  Are comfortable and fit you well.  Are closed at the toe. Do not wear sandals.  If you use a stepladder:  Make sure that it is fully opened. Do not climb a closed stepladder.  Make sure that both sides of the stepladder are locked into place.  Ask someone to hold it for you, if possible.  Clearly mark and make sure that you can see:  Any grab bars or handrails.  First and last steps.  Where the edge of each step is.  Use tools that help you move around (mobility aids) if they are needed. These include:  Canes.  Walkers.  Scooters.  Crutches.  Turn on the lights when you go into a dark area. Replace any light bulbs as soon as they burn out.  Set up your furniture so you have a clear path. Avoid moving your furniture around.  If any of your floors are uneven, fix them.  If there are any pets around you, be aware of where they are.  Review your medicines with your doctor. Some medicines can make you feel dizzy. This can increase your chance of falling. Ask your doctor what other things that you can do to help prevent falls. This information is not intended to replace advice given to you by your health care provider. Make sure you discuss any questions you have with your health care provider. Document Released: 08/11/2009 Document Revised: 03/22/2016 Document Reviewed: 11/19/2014 Elsevier Interactive Patient Education  2017 [employer].

## 2017-09-25 ENCOUNTER — Ambulatory Visit: Payer: PPO | Admitting: Internal Medicine

## 2017-09-25 ENCOUNTER — Encounter: Payer: Self-pay | Admitting: Internal Medicine

## 2017-09-25 VITALS — BP 120/80 | HR 76 | Temp 98.1°F | Ht 61.0 in | Wt 164.0 lb

## 2017-09-25 DIAGNOSIS — K219 Gastro-esophageal reflux disease without esophagitis: Secondary | ICD-10-CM | POA: Diagnosis not present

## 2017-09-25 DIAGNOSIS — I10 Essential (primary) hypertension: Secondary | ICD-10-CM | POA: Diagnosis not present

## 2017-09-25 DIAGNOSIS — M5416 Radiculopathy, lumbar region: Secondary | ICD-10-CM

## 2017-09-25 DIAGNOSIS — F411 Generalized anxiety disorder: Secondary | ICD-10-CM | POA: Diagnosis not present

## 2017-09-25 MED ORDER — DULOXETINE HCL 60 MG PO CPEP: 60.0000 mg | ORAL_CAPSULE | Freq: Every day | ORAL | 6 refills | Status: DC

## 2017-09-25 NOTE — Progress Notes (Signed)
Patient ID: Alexandria Beck, female   DOB: April 12, 1931, 83 y.o.   MRN: [id number]    Location:  [facility] Place of Service: OFFICE  Chief Complaint  Patient presents with  . Medical Management of Chronic Issues    3 month follow-up. Patient c/o roughness on inner lip x long time   . Medication Refill    No refills needed   . Medication Management    Discuss changing Nexium to Ranitidine, Discuss medication for Anxiety- diazepam not helping     HPI:  83 yo female seen today for f/u. She reports continued anxiety and tried a family member's anxiolytic which helped. She does not know the name of medication. She is a poor historian due to memory loss. Hx obtained from chart.  Varicose veins RLE - stable. They become "puffy" with prolonged standing. Venous doppler US RLE in Feb 2018 neg for DVT.   GAD -  had a severe panic attack a several mos ago and EMS called. Heart checked out. She was not taken to ED. She tried higher dose of effexor xr which caused increased anxiety and she stopped after 1 dose. cymbalta helps a little but she has breakthrough symptom.  Diazepam not effective. Clonopin caused swelling. Not sleeping well. She drinks sleepytime tea occasionally. Dreams are strange. She feels occasional fatigue and anhedonia. Denies depression  HTN -  stable on diovan   GERD - sx's somewhat controlled on prn alka seltzer heartburn med. She stopped her nexium as she was taking it > 10 yrs and it is not effective. She has constipation which is better on miralax. Dysphagia is stable. She saw GI and decided not to have repeat endoscopy.  Hx lumbar spondylolisthesis/DDD/spinal stenosis - she has intermittent "catches" in left lower back. Intermittent neuropathy. Last MRI in 04/2013.  MRI L spine in Aug 2017 showed worsening arthritic changes in lumbar spine and severe spinal stenosis at L4-L5, bulging discs. No compression of spinal cord. She had epidural 05/18/13. IR did not feel she needed epidural in Oct  2017 when she saw them. CT C spine in 2013 revealed mild multilevel DDD; xray C spine in June 2017 revealed loss of C spine curvature, diffuse DDD from C4-C7 with some foraminal narrowing at C5-6 and C6-7.  Memory loss, short term - MMSE 27/30. She does not take any meds for cognition.  Past Medical History:  Diagnosis Date  . Allergy    seasonal  . Diverticulosis 2004  . Esophageal stricture   . GERD (gastroesophageal reflux disease)    has had EGD 2006-negative  . Helicobacter positive gastritis 2006  . Hypertension     On no medication. Home readings SBP=150's  . Internal hemorrhoids   . Irregular heart beat    no testing, holter, etc. Takes no medication  . Near syncope    diagnosed as anxiety related. gets some relief with clonazepam. No formal testing done.    Past Surgical History:  Procedure Laterality Date  . ABDOMINAL HYSTERECTOMY  1974   fibroid tumors w/ metorrhagia  . Robert.Gallant - NSVD  midwife delivery for two    . TONSILLECTOMY    . TONSILLECTOMY      Patient Care Team: Gildardo Cranker, DO as PCP - General (Internal Medicine) Hendricks Limes, MD as Consulting Physician (Internal Medicine) Shirley Muscat Loreen Freud, MD as Referring Physician (Optometry)  Social History   Socioeconomic History  . Marital status: Widowed    Spouse name: Not on file  . Number of  children: 4  . Years of education: 69  . Highest education level: Not on file  Social Needs  . Financial resource strain: Not very hard  . Food insecurity - worry: Never true  . Food insecurity - inability: Never true  . Transportation needs - medical: No  . Transportation needs - non-medical: No  Occupational History  . Occupation: stock person    Comment: retired  Tobacco Use  . Smoking status: Never Smoker  . Smokeless tobacco: Never Used  Substance and Sexual Activity  . Alcohol use: No    Alcohol/week: 0.0 oz  . Drug use: No  . Sexual activity: Not Currently  Other Topics Concern  . Not on  file  Social History Narrative   Diet:      Do you drink/ eat things with caffeine? yes      Marital status:   widow                            What year were you married ?  1953      Do you live in a house, apartment,assistred living, condo, trailer, etc.)? house      Is it one or more stories? 1 story      How many persons live in your home ?2      Do you have any pets in your home ?(please list)  none      Current or past profession: [employer]      Do you exercise?  yes                            Type & how often:  walking      Do you have a living will?  no      Do you have a DNR form?     no                  If not, do you want to discuss one?       Do you have signed POA?HPOA forms?  no               If so, please bring to your        appointment                       reports that  has never smoked. she has never used smokeless tobacco. She reports that she does not drink alcohol or use drugs.  Family History  Problem Relation Age of Onset  . Heart disease Mother   . Stroke Mother   . Hypertension Mother   . Cancer Father        lung cancer - smoker  . Heart disease Sister        MI  . Kidney disease Sister        end stage renal disease on HD  . Diabetes Sister   . Heart disease Brother   . Diabetes Sister   . Hypertension Sister   . Kidney disease Sister   . Cancer Sister   . Diabetes Sister   . Kidney disease Sister        HD  . Hypertension Sister   . Heart disease Sister   . Hypertension Sister   . Heart disease Sister   . Hypertension Sister   . Hypertension Sister    Family Status  Relation  Name Status  . Mother ethel Deceased at age 11  . Father Zenia Resides Deceased at age 26  . Sister etheline Deceased  . Brother eward Deceased       CAD/MI  . Sister wilamenia Deceased  . Sister lula Deceased  . Sister Freight forwarder  . Sister [employer]  . Sister [employer]  . Sister lou IT consultant  . Son [employer]  . Daughter Occupational psychologist  .  Son [employer]  . Son [employer]  . Sister  (Not Specified)  . Brother  (Not Specified)  . Sister  (Not Specified)  . Sister  (Not Specified)  . Sister  (Not Specified)  . Sister  (Not Specified)  . Sister  (Not Specified)  . Sister  (Not Specified)  . Sister  (Not Specified)     Allergies  Allergen Reactions  . Clonazepam Swelling and Other (See Comments)    Tongue thickening sensation  . Codeine Rash  . Lexapro [Escitalopram Oxalate] Other (See Comments)    Made her "feel funny"  . Aspirin Nausea And Vomiting and Other (See Comments)    Stomach upset    Medications:   Medication List        Accurate as of 09/25/17  3:40 PM. Always use your most recent med list.          ALKA-SELTZER HEARTBURN PO   diazepam 5 MG tablet Commonly known as:  VALIUM Take 1 tablet (5 mg total) by mouth every 8 (eight) hours as needed for anxiety.   DULoxetine 30 MG capsule Commonly known as:  CYMBALTA Take 1 capsule (30 mg total) by mouth daily. For anxiety   ergocalciferol 50000 units capsule Commonly known as:  VITAMIN D2   OVER THE COUNTER MEDICATION   PATADAY 0.2 % Soln Generic drug:  Olopatadine HCl   predniSONE 10 MG (21) Tbpk tablet Commonly known as:  STERAPRED UNI-PAK 21 TAB Use as directed   RESTASIS 0.05 % ophthalmic emulsion Generic drug:  cycloSPORINE   telmisartan 40 MG tablet Commonly known as:  MICARDIS Take 1 tablet (40 mg total) by mouth daily. For blood pressure   traMADol 50 MG tablet Commonly known as:  ULTRAM Take 1 tablet (50 mg total) by mouth every 6 (six) hours as needed for moderate pain or severe pain.   vitamin C 500 MG tablet Commonly known as:  ASCORBIC ACID       Review of Systems  Musculoskeletal: Positive for arthralgias and back pain.  Psychiatric/Behavioral: The patient is nervous/anxious.   All other systems reviewed and are negative.   Vitals:   09/25/17 1529  BP: 120/80  Pulse: 76  Temp: 98.1 F (36.7 C)    TempSrc: Oral  SpO2: 97%  Weight: 164 lb (74.4 kg)  Height: 5\' 1"  (1.549 m)   Body mass index is 30.99 kg/m.  Physical Exam  Constitutional: She is oriented to person, place, and time. She appears well-developed and well-nourished.  HENT:  Mouth/Throat: Oropharynx is clear and moist. No oropharyngeal exudate.  MMM; no oral thrush  Eyes: Pupils are equal, round, and reactive to light. No scleral icterus.  Neck: Neck supple. Carotid bruit is not present. No tracheal deviation present. No thyromegaly present.  Cardiovascular: Normal rate, regular rhythm and intact distal pulses. Exam reveals no gallop and no friction rub.  Murmur (1/6 SEM) heard. No LE edema b/l. no calf TTP.   Pulmonary/Chest: Effort normal and breath sounds normal. No stridor. No respiratory distress.  She has no wheezes. She has no rales.  Abdominal: Soft. Normal appearance and bowel sounds are normal. She exhibits distension. She exhibits no mass. There is no hepatomegaly. There is no tenderness. There is no rigidity, no rebound and no guarding. No hernia.  Musculoskeletal: She exhibits edema and tenderness.  Lymphadenopathy:    She has no cervical adenopathy.  Neurological: She is alert and oriented to person, place, and time.  Gait antalgic  Skin: Skin is warm and dry. No rash noted.  Psychiatric: She has a normal mood and affect. Her behavior is normal. Judgment and thought content normal.     Labs reviewed: Clinical Support on 09/17/2017  Component Date Value Ref Range Status  . HM Diabetic Eye Exam 03/29/2017 No Retinopathy  No Retinopathy Final   normal    Dg Cervical Spine Complete  Result Date: 08/29/2017 CLINICAL DATA:  Chronic posterior neck pain with numbness radiating down the right arm. EXAM: CERVICAL SPINE - COMPLETE 4+ VIEW COMPARISON:  04/18/2016 FINDINGS: No evidence of fracture, discitis, or aggressive bone lesion. No prevertebral thickening. Chronic C1-2 facet arthritis with hypertrophic  anterior arch. The posterior arch is chronically low due to rotation. This appearance is stable from 2013 cervical spine CT. Degenerative disc narrowing with spurring and vertebral body remodeling, greatest from C4-5 to C6-7. Upper cervical predominant degenerative facet spurring. Prominent uncovertebral spurring and foraminal narrowing on the left at C6-7. No notable foraminal narrowing on the right. IMPRESSION: No acute finding or change from 2017. Extent of degenerative disease is described above. Electronically Signed   By: Monte Fantasia M.D.   On: 08/29/2017 08:45     Assessment/Plan   ICD-10-CM   1. Generalized anxiety disorder F41.1 DULoxetine (CYMBALTA) 60 MG capsule  2. Left lumbar radiculopathy M54.16   3. Essential hypertension I10   4. Gastroesophageal reflux disease without esophagitis K21.9    INCREASE CYMBALTA 60MG  DAILY FOR PAIN AND ANXIETY  RECOMMEND ALUMINUM FREE DEODORANT (LIKE Tom's) to reduce rash under arms  Continue other medications as ordered  Follow up in 4 mos for memory loss, HTN, back pain, anxiety  [address] Perlie Gold  [facility] [address] [place], [place] [address] [phone] Cell (Monday-Friday 8 AM - 5 PM) [phone] After 5 PM and follow prompts

## 2017-09-25 NOTE — Patient Instructions (Addendum)
INCREASE CYMBALTA 60MG  DAILY FOR PAIN AND ANXIETY  RECOMMEND ALUMINUM FREE DEODORANT (LIKE Tom's) to reduce rash under arms  Continue other medications as ordered  Follow up in 4 mos for memory loss, HTN, back pain, anxiety

## 2017-10-02 ENCOUNTER — Ambulatory Visit: Payer: PPO | Admitting: Internal Medicine

## 2017-11-07 ENCOUNTER — Other Ambulatory Visit: Payer: Self-pay

## 2017-11-07 DIAGNOSIS — E2839 Other primary ovarian failure: Secondary | ICD-10-CM

## 2017-11-22 ENCOUNTER — Ambulatory Visit (INDEPENDENT_AMBULATORY_CARE_PROVIDER_SITE_OTHER): Payer: PPO | Admitting: Internal Medicine

## 2017-11-22 ENCOUNTER — Encounter: Payer: Self-pay | Admitting: Internal Medicine

## 2017-11-22 VITALS — BP 140/80 | HR 80 | Temp 98.2°F | Wt 162.0 lb

## 2017-11-22 DIAGNOSIS — I6521 Occlusion and stenosis of right carotid artery: Secondary | ICD-10-CM | POA: Diagnosis not present

## 2017-11-22 DIAGNOSIS — F411 Generalized anxiety disorder: Secondary | ICD-10-CM | POA: Diagnosis not present

## 2017-11-22 DIAGNOSIS — R0989 Other specified symptoms and signs involving the circulatory and respiratory systems: Secondary | ICD-10-CM | POA: Diagnosis not present

## 2017-11-22 DIAGNOSIS — J309 Allergic rhinitis, unspecified: Secondary | ICD-10-CM | POA: Diagnosis not present

## 2017-11-22 DIAGNOSIS — R300 Dysuria: Secondary | ICD-10-CM

## 2017-11-22 DIAGNOSIS — I10 Essential (primary) hypertension: Secondary | ICD-10-CM

## 2017-11-22 DIAGNOSIS — G4700 Insomnia, unspecified: Secondary | ICD-10-CM

## 2017-11-22 DIAGNOSIS — R202 Paresthesia of skin: Secondary | ICD-10-CM

## 2017-11-22 LAB — URINALYSIS, ROUTINE W REFLEX MICROSCOPIC
Bilirubin Urine: NEGATIVE
GLUCOSE, UA: NEGATIVE
Hgb urine dipstick: NEGATIVE
Ketones, ur: NEGATIVE
LEUKOCYTES UA: NEGATIVE
NITRITE: NEGATIVE
PH: 6 (ref 5.0–8.0)
Protein, ur: NEGATIVE
SPECIFIC GRAVITY, URINE: 1.021 (ref 1.001–1.03)

## 2017-11-22 LAB — CBC WITH DIFFERENTIAL/PLATELET
BASOS ABS: 28 {cells}/uL (ref 0–200)
Basophils Relative: 0.5 %
EOS ABS: 151 {cells}/uL (ref 15–500)
Eosinophils Relative: 2.7 %
HEMATOCRIT: 36.2 % (ref 35.0–45.0)
HEMOGLOBIN: 12.2 g/dL (ref 11.7–15.5)
LYMPHS ABS: 2453 {cells}/uL (ref 850–3900)
MCH: 30.3 pg (ref 27.0–33.0)
MCHC: 33.7 g/dL (ref 32.0–36.0)
MCV: 90 fL (ref 80.0–100.0)
MPV: 8.4 fL (ref 7.5–12.5)
Monocytes Relative: 6.4 %
NEUTROS ABS: 2610 {cells}/uL (ref 1500–7800)
NEUTROS PCT: 46.6 %
Platelets: 330 10*3/uL (ref 140–400)
RBC: 4.02 10*6/uL (ref 3.80–5.10)
RDW: 12.7 % (ref 11.0–15.0)
Total Lymphocyte: 43.8 %
WBC mixed population: 358 cells/uL (ref 200–950)
WBC: 5.6 10*3/uL (ref 3.8–10.8)

## 2017-11-22 LAB — COMPLETE METABOLIC PANEL WITH GFR
AG RATIO: 1.4 (calc) (ref 1.0–2.5)
ALKALINE PHOSPHATASE (APISO): 55 U/L (ref 33–130)
ALT: 10 U/L (ref 6–29)
AST: 17 U/L (ref 10–35)
Albumin: 4.1 g/dL (ref 3.6–5.1)
BILIRUBIN TOTAL: 0.4 mg/dL (ref 0.2–1.2)
BUN: 12 mg/dL (ref 7–25)
CHLORIDE: 107 mmol/L (ref 98–110)
CO2: 26 mmol/L (ref 20–32)
Calcium: 9.5 mg/dL (ref 8.6–10.4)
Creat: 0.84 mg/dL (ref 0.60–0.88)
GFR, Est African American: 74 mL/min/{1.73_m2} (ref >=60)
GFR, Est Non African American: 64 mL/min/{1.73_m2} (ref >=60)
GLOBULIN: 3 g/dL (ref 1.9–3.7)
Glucose, Bld: 99 mg/dL (ref 65–99)
POTASSIUM: 3.7 mmol/L (ref 3.5–5.3)
SODIUM: 141 mmol/L (ref 135–146)
Total Protein: 7.1 g/dL (ref 6.1–8.1)

## 2017-11-22 MED ORDER — MELATONIN 5 MG PO CHEW: 1.0000 | CHEWABLE_TABLET | Freq: Every day | ORAL | 6 refills | Status: DC

## 2017-11-22 MED ORDER — CETIRIZINE HCL 5 MG PO TABS: 5.0000 mg | ORAL_TABLET | Freq: Every day | ORAL | 6 refills | Status: DC

## 2017-11-22 NOTE — Patient Instructions (Signed)
Continue current medications as ordered  Will call with [place] appt   Will call with lab results  Push fluids  Follow up as scheduled or sooner if not feeling better

## 2017-11-22 NOTE — Progress Notes (Signed)
Patient ID: Alexandria Beck, female   DOB: April 22, 1931, 84 y.o.   MRN: [id number]   [facility] OFFICE  Provider: DR Arletha Grippe  Goals of Care:  Advanced Directives 09/25/2017  Does Patient Have a Medical Advance Directive? No  Type of Advance Directive -  Does patient want to make changes to medical advance directive? -  Copy of [place] in Chart? -  Would patient like information on creating a medical advance directive? Yes (MAU/Ambulatory/Procedural Areas - Information given)     Chief Complaint  Patient presents with  . Acute Visit    discuss anxiety, insomia, acid reflux    HPI: Patient is a 84 y.o. female seen today for an acute visit for several concerns - intermittent insomnia x "quite a while", anxiety, abdominal soreness and dysuria. Ginger tea helps calm nerves. sleepy time tea helps her to sleep "a little". She has a dry mouth and noticed that the tip of her tongue is red. She had been drinking alkaline water but stopped 1 month ago. she also c/o post nasal drip.   Carotid study in 2013 revealed right ICA 40-59% stenosis; no significant stenosis on left ICA   Past Medical History:  Diagnosis Date  . Allergy    seasonal  . Diverticulosis 2004  . Esophageal stricture   . GERD (gastroesophageal reflux disease)    has had EGD 2006-negative  . Helicobacter positive gastritis 2006  . Hypertension     On no medication. Home readings SBP=150's  . Internal hemorrhoids   . Irregular heart beat    no testing, holter, etc. Takes no medication  . Near syncope    diagnosed as anxiety related. gets some relief with clonazepam. No formal testing done.    Past Surgical History:  Procedure Laterality Date  . ABDOMINAL HYSTERECTOMY  1974   fibroid tumors w/ metorrhagia  . Robert.Gallant - NSVD  midwife delivery for two    . TONSILLECTOMY    . TONSILLECTOMY       reports that  has never smoked. she has never used smokeless tobacco. She reports that she does not drink  alcohol or use drugs. Social History   Socioeconomic History  . Marital status: Widowed    Spouse name: Not on file  . Number of children: 4  . Years of education: 107  . Highest education level: Not on file  Social Needs  . Financial resource strain: Not very hard  . Food insecurity - worry: Never true  . Food insecurity - inability: Never true  . Transportation needs - medical: No  . Transportation needs - non-medical: No  Occupational History  . Occupation: stock person    Comment: retired  Tobacco Use  . Smoking status: Never Smoker  . Smokeless tobacco: Never Used  Substance and Sexual Activity  . Alcohol use: No    Alcohol/week: 0.0 oz  . Drug use: No  . Sexual activity: Not Currently  Other Topics Concern  . Not on file  Social History Narrative   Diet:      Do you drink/ eat things with caffeine? yes      Marital status:   widow                            What year were you married ?  1953      Do you live in a house, apartment,assistred living, condo, trailer, etc.)? house  Is it one or more stories? 1 story      How many persons live in your home ?2      Do you have any pets in your home ?(please list)  none      Current or past profession: [employer]      Do you exercise?  yes                            Type & how often:  walking      Do you have a living will?  no      Do you have a DNR form?     no                  If not, do you want to discuss one?       Do you have signed POA?HPOA forms?  no               If so, please bring to your        appointment                      Family History  Problem Relation Age of Onset  . Heart disease Mother   . Stroke Mother   . Hypertension Mother   . Cancer Father        lung cancer - smoker  . Heart disease Sister        MI  . Kidney disease Sister        end stage renal disease on HD  . Diabetes Sister   . Heart disease Brother   . Diabetes Sister   . Hypertension Sister   . Kidney  disease Sister   . Cancer Sister   . Diabetes Sister   . Kidney disease Sister        HD  . Hypertension Sister   . Heart disease Sister   . Hypertension Sister   . Heart disease Sister   . Hypertension Sister   . Hypertension Sister     Allergies  Allergen Reactions  . Clonazepam Swelling and Other (See Comments)    Tongue thickening sensation  . Codeine Rash  . Lexapro [Escitalopram Oxalate] Other (See Comments)    Made her "feel funny"  . Aspirin Nausea And Vomiting and Other (See Comments)    Stomach upset    Outpatient Encounter Medications as of 11/22/2017  Medication Sig  . diazepam (VALIUM) 5 MG tablet Take 1 tablet (5 mg total) by mouth every 8 (eight) hours as needed for anxiety.  . DULoxetine (CYMBALTA) 60 MG capsule Take 1 capsule (60 mg total) by mouth daily. For anxiety  . ergocalciferol (VITAMIN D2) 50000 units capsule Take 50,000 Units by mouth once a week.  [name] OVER THE COUNTER MEDICATION Apply 1 application topically daily. "Two Old Goats - Moisturizing lotion"  . PATADAY 0.2 % SOLN 1 drop in right eye every morning  . RESTASIS 0.05 % ophthalmic emulsion Place 1 drop into both eyes 2 (two) times daily.   . Sodium Bicarbonate-Citric Acid (ALKA-SELTZER HEARTBURN PO) Take by mouth as needed.  [name] telmisartan (MICARDIS) 40 MG tablet Take 1 tablet (40 mg total) by mouth daily. For blood pressure  . traMADol (ULTRAM) 50 MG tablet Take 1 tablet (50 mg total) by mouth every 6 (six) hours as needed for moderate pain or severe pain.  . vitamin C (ASCORBIC ACID) 500 MG  tablet Take 500 mg by mouth daily.  . [DISCONTINUED] predniSONE (STERAPRED UNI-PAK 21 TAB) 10 MG (21) TBPK tablet Use as directed   No facility-administered encounter medications on file as of 11/22/2017.     Review of Systems:  Review of Systems  Genitourinary: Positive for dysuria.  Neurological: Positive for numbness (paresthesias from neck -->parietal scalp L>R).  Psychiatric/Behavioral: Positive for  sleep disturbance. The patient is nervous/anxious.   All other systems reviewed and are negative.   Health Maintenance  Topic Date Due  . FOOT EXAM  06/23/1943  . HEMOGLOBIN A1C  10/09/2014  . DEXA SCAN  10/30/2023 (Originally 06/22/1998)  . OPHTHALMOLOGY EXAM  03/29/2018  . TETANUS/TDAP  12/28/2018  . INFLUENZA VACCINE  Completed  . PNA vac Low Risk Adult  Completed    Physical Exam: Vitals:   11/22/17 0814  BP: 140/80  Pulse: 80  Temp: 98.2 F (36.8 C)  TempSrc: Oral  SpO2: 98%  Weight: 162 lb (73.5 kg)   Body mass index is 30.61 kg/m. Physical Exam  Constitutional: She is oriented to person, place, and time. She appears well-developed and well-nourished.  Looks well in NAD  HENT:  Mouth/Throat: Oropharynx is clear and moist. No oropharyngeal exudate.  MMM; no oral thrush  Eyes: Pupils are equal, round, and reactive to light. No scleral icterus.  Neck: Neck supple. Muscular tenderness present. Carotid bruit is present (right). Decreased range of motion present. No tracheal deviation present. No thyromegaly present.    Cardiovascular: Normal rate, regular rhythm and intact distal pulses. Exam reveals no gallop and no friction rub.  Murmur (2/6 SEM --> carotid b/l) heard. No LE edema b/l. no calf TTP.   Pulmonary/Chest: Effort normal and breath sounds normal. No stridor. No respiratory distress. She has no wheezes. She has no rales.  Abdominal: Soft. Normal appearance and bowel sounds are normal. She exhibits no distension and no mass. There is no hepatomegaly. There is tenderness (midepigastric). There is no rigidity, no rebound and no guarding. No hernia.  Musculoskeletal: She exhibits edema and tenderness.       Lumbar back: She exhibits decreased range of motion, tenderness and spasm.       Back:  Lymphadenopathy:    She has no cervical adenopathy.  Neurological: She is alert and oriented to person, place, and time.  Skin: Skin is warm and dry. No rash noted.    Psychiatric: She has a normal mood and affect. Her behavior is normal. Judgment and thought content normal.    Labs reviewed: Basic Metabolic Panel: Recent Labs    01/12/17 0740 04/02/17 1030 05/30/17 1111  NA 140 142 142  K 3.5 5.1 3.4*  CL 105 107 110  CO2 23 21 21*  GLUCOSE 102* 90 108*  BUN _0 CREATININE 0.80 0.89* 0.85  CALCIUM 9.0 9.3 9.6  TSH  --  0.81  --    Liver Function Tests: Recent Labs    11/28/16 1012 04/02/17 1030 05/30/17 1110  AST  --  29 23  ALT _1 ALKPHOS  --  50 54  BILITOT  --  0.4 0.5  PROT  --  7.1 7.6  ALBUMIN  --  3.8 4.0   Recent Labs    05/30/17 1110  LIPASE 12   No results for input(s): AMMONIA in the last 8760 hours. CBC: Recent Labs    01/12/17 0740 04/02/17 1030 05/30/17 1111  WBC 5.8 5.8 7.0  NEUTROABS  --  2,030  --  HGB 10.4* 11.2* 12.0  HCT 31.7* 35.5 35.2*  MCV 84.8 86.2 86.3  PLT 284 364 309   Lipid Panel: No results for input(s): CHOL, HDL, LDLCALC, TRIG, CHOLHDL, LDLDIRECT in the last 8760 hours. Lab Results  Component Value Date   HGBA1C 6.1 04/09/2014    Procedures since last visit: No results found.  Assessment/Plan   ICD-10-CM   1. Bruit of right carotid artery R09.89 VAS US CAROTID  2. Insomnia, unspecified type G47.00 Melatonin 5 MG CHEW    CMP with eGFR([facility])  3. Generalized anxiety disorder F41.1 CMP with eGFR([facility])  4. Allergic rhinitis, unspecified seasonality, unspecified trigger J30.9 cetirizine (ZYRTEC) 5 MG tablet  5. Dysuria R30.0 Urinalysis with Reflex Microscopic    Culture, Urine    CBC with Differential/Platelets    CMP with eGFR([facility])  6. Paresthesias R20.2 VAS US CAROTID  7. Essential hypertension I10 VAS US CAROTID    CMP with eGFR([facility])  8. Carotid stenosis, right I65.21 VAS US CAROTID   Continue current medications as ordered  Will call with [place] appt   Will call with lab results  Push fluids  Follow up as scheduled or sooner if not feeling  better   [facility] S. Perlie Gold  [facility] and Adult Medicine [address] [place], [place] [address] [phone] Cell (Monday-Friday 8 AM - 5 PM) [phone] After 5 PM and follow prompts

## 2017-11-23 LAB — URINE CULTURE
MICRO NUMBER: 90110426
SPECIMEN QUALITY:: ADEQUATE

## 2017-11-27 ENCOUNTER — Ambulatory Visit (HOSPITAL_COMMUNITY)
Admission: RE | Admit: 2017-11-27 | Discharge: 2017-11-27 | Disposition: A | Discharge status: Home / Self Care | Payer: PPO | Source: Ambulatory Visit | Attending: Vascular Surgery | Admitting: Vascular Surgery

## 2017-11-27 ENCOUNTER — Inpatient Hospital Stay
Admission: RE | Admit: 2017-11-27 | Discharge: 2017-11-27 | Disposition: A | Discharge status: Home / Self Care | Payer: PPO | Source: Ambulatory Visit | Attending: Internal Medicine | Admitting: Internal Medicine

## 2017-11-27 DIAGNOSIS — I6521 Occlusion and stenosis of right carotid artery: Secondary | ICD-10-CM | POA: Diagnosis not present

## 2017-11-27 DIAGNOSIS — R0989 Other specified symptoms and signs involving the circulatory and respiratory systems: Secondary | ICD-10-CM | POA: Insufficient documentation

## 2017-11-27 DIAGNOSIS — I6523 Occlusion and stenosis of bilateral carotid arteries: Secondary | ICD-10-CM | POA: Insufficient documentation

## 2017-11-27 DIAGNOSIS — R202 Paresthesia of skin: Secondary | ICD-10-CM | POA: Insufficient documentation

## 2017-11-27 DIAGNOSIS — I10 Essential (primary) hypertension: Secondary | ICD-10-CM | POA: Diagnosis not present

## 2017-11-27 LAB — VAS US CAROTID
LCCADDIAS: -11 cm/s
LCCAPDIAS: 15 cm/s
LEFT ECA DIAS: 0 cm/s
LICADSYS: -50 cm/s
LICAPSYS: 57 cm/s
Left CCA dist sys: -54 cm/s
Left CCA prox sys: 95 cm/s
Left ICA dist dias: -11 cm/s
Left ICA prox dias: 9 cm/s
RCCAPSYS: 89 cm/s
RIGHT CCA MID DIAS: 13 cm/s
RIGHT ECA DIAS: -4 cm/s
Right CCA prox dias: 10 cm/s
Right cca dist sys: -124 cm/s

## 2017-12-02 ENCOUNTER — Ambulatory Visit
Admission: RE | Admit: 2017-12-02 | Discharge: 2017-12-02 | Disposition: A | Discharge status: Home / Self Care | Payer: PPO | Source: Ambulatory Visit | Attending: Internal Medicine | Admitting: Internal Medicine

## 2017-12-02 DIAGNOSIS — M810 Age-related osteoporosis without current pathological fracture: Secondary | ICD-10-CM | POA: Diagnosis not present

## 2017-12-02 DIAGNOSIS — E2839 Other primary ovarian failure: Secondary | ICD-10-CM

## 2017-12-02 DIAGNOSIS — Z780 Asymptomatic menopausal state: Secondary | ICD-10-CM | POA: Diagnosis not present

## 2017-12-06 ENCOUNTER — Ambulatory Visit: Payer: Self-pay | Admitting: Internal Medicine

## 2017-12-20 ENCOUNTER — Ambulatory Visit (INDEPENDENT_AMBULATORY_CARE_PROVIDER_SITE_OTHER): Payer: PPO | Admitting: Internal Medicine

## 2017-12-20 ENCOUNTER — Encounter: Payer: Self-pay | Admitting: Internal Medicine

## 2017-12-20 VITALS — BP 130/82 | HR 80 | Temp 98.5°F | Ht 61.0 in | Wt 163.0 lb

## 2017-12-20 DIAGNOSIS — R350 Frequency of micturition: Secondary | ICD-10-CM

## 2017-12-20 DIAGNOSIS — G4700 Insomnia, unspecified: Secondary | ICD-10-CM

## 2017-12-20 DIAGNOSIS — F419 Anxiety disorder, unspecified: Secondary | ICD-10-CM | POA: Diagnosis not present

## 2017-12-20 DIAGNOSIS — R202 Paresthesia of skin: Secondary | ICD-10-CM | POA: Diagnosis not present

## 2017-12-20 DIAGNOSIS — R1013 Epigastric pain: Secondary | ICD-10-CM

## 2017-12-20 DIAGNOSIS — K219 Gastro-esophageal reflux disease without esophagitis: Secondary | ICD-10-CM

## 2017-12-20 MED ORDER — TRAZODONE HCL 50 MG PO TABS: 25.0000 mg | ORAL_TABLET | Freq: Every evening | ORAL | 3 refills | Status: DC | PRN

## 2017-12-20 MED ORDER — NYSTATIN 100000 UNIT/ML MT SUSP: 5.0000 mL | Freq: Four times a day (QID) | OROMUCOSAL | 0 refills | Status: AC

## 2017-12-20 NOTE — Progress Notes (Signed)
Patient ID: Alexandria Beck, female   DOB: June 18, 1931, 84 y.o.   MRN: [id number]   [facility] OFFICE  Provider: DR Arletha Grippe  Code Status:  Goals of Care:  Advanced Directives 09/25/2017  Does Patient Have a Medical Advance Directive? No  Type of Advance Directive -  Does patient want to make changes to medical advance directive? -  Copy of [place] in Chart? -  Would patient like information on creating a medical advance directive? Yes (MAU/Ambulatory/Procedural Areas - Information given)     Chief Complaint  Patient presents with  . Acute Visit    Lower abdominal pain x 2-3 weeks, denies vaginal pain. Patient with other stomach concerns, holding stomach in at time- questions if related to nerves  . Urinary Frequency    Patient c/o urinary frequency, no pain or discomfort associated with urination. Urine is clear   . Dry Mouth    Patient c/o dry mouth x 1 month   . Medication Management    Melatonin not helping     HPI: Patient is a 84 y.o. female seen today for an acute visit for tip of tongue red in color, dry mouth and insomnia. She tried melatonin without relief. She gargles with mouth wash. She feels like her anxiety is uncontrolled. She continues to drink ginger tea but she is out of sleepy time tea. She was seen at the end of Jan 2019 and had the same complaints. She is a poor historian due to short term memory loss. Hx obtained from chart. No f/c, HA, N/V. She has abdominal pain "whenever I'm working with my hands". UA neg last month with cx showing multiple orgs s/o contaminants  Past Medical History:  Diagnosis Date  . Allergy    seasonal  . Diverticulosis 2004  . Esophageal stricture   . GERD (gastroesophageal reflux disease)    has had EGD 2006-negative  . Helicobacter positive gastritis 2006  . Hypertension     On no medication. Home readings SBP=150's  . Internal hemorrhoids   . Irregular heart beat    no testing, holter, etc. Takes no  medication  . Near syncope    diagnosed as anxiety related. gets some relief with clonazepam. No formal testing done.    Past Surgical History:  Procedure Laterality Date  . ABDOMINAL HYSTERECTOMY  1974   fibroid tumors w/ metorrhagia  . Robert.Gallant - NSVD  midwife delivery for two    . TONSILLECTOMY    . TONSILLECTOMY       reports that  has never smoked. she has never used smokeless tobacco. She reports that she does not drink alcohol or use drugs. Social History   Socioeconomic History  . Marital status: Widowed    Spouse name: Not on file  . Number of children: 4  . Years of education: 75  . Highest education level: Not on file  Social Needs  . Financial resource strain: Not very hard  . Food insecurity - worry: Never true  . Food insecurity - inability: Never true  . Transportation needs - medical: No  . Transportation needs - non-medical: No  Occupational History  . Occupation: stock person    Comment: retired  Tobacco Use  . Smoking status: Never Smoker  . Smokeless tobacco: Never Used  Substance and Sexual Activity  . Alcohol use: No    Alcohol/week: 0.0 oz  . Drug use: No  . Sexual activity: Not Currently  Other Topics Concern  .  Not on file  Social History Narrative   Diet:      Do you drink/ eat things with caffeine? yes      Marital status:   widow                            What year were you married ?  1953      Do you live in a house, apartment,assistred living, condo, trailer, etc.)? house      Is it one or more stories? 1 story      How many persons live in your home ?2      Do you have any pets in your home ?(please list)  none      Current or past profession: [employer]      Do you exercise?  yes                            Type & how often:  walking      Do you have a living will?  no      Do you have a DNR form?     no                  If not, do you want to discuss one?       Do you have signed POA?HPOA forms?  no               If so, please  bring to your        appointment                      Family History  Problem Relation Age of Onset  . Heart disease Mother   . Stroke Mother   . Hypertension Mother   . Cancer Father        lung cancer - smoker  . Heart disease Sister        MI  . Kidney disease Sister        end stage renal disease on HD  . Diabetes Sister   . Heart disease Brother   . Diabetes Sister   . Hypertension Sister   . Kidney disease Sister   . Cancer Sister   . Diabetes Sister   . Kidney disease Sister        HD  . Hypertension Sister   . Heart disease Sister   . Hypertension Sister   . Heart disease Sister   . Hypertension Sister   . Hypertension Sister     Allergies  Allergen Reactions  . Clonazepam Swelling and Other (See Comments)    Tongue thickening sensation  . Codeine Rash  . Lexapro [Escitalopram Oxalate] Other (See Comments)    Made her "feel funny"  . Aspirin Nausea And Vomiting and Other (See Comments)    Stomach upset    Outpatient Encounter Medications as of 12/20/2017  Medication Sig  . cetirizine (ZYRTEC) 5 MG tablet Take 1 tablet (5 mg total) by mouth daily. allergies  . diazepam (VALIUM) 5 MG tablet Take 1 tablet (5 mg total) by mouth every 8 (eight) hours as needed for anxiety.  . DULoxetine (CYMBALTA) 60 MG capsule Take 1 capsule (60 mg total) by mouth daily. For anxiety  . ergocalciferol (VITAMIN D2) 50000 units capsule Take 50,000 Units by mouth once a week.  . Melatonin 5 MG CHEW Chew 1  tablet by mouth at bedtime. To help sleep  . OVER THE COUNTER MEDICATION Apply 1 application topically daily. "Two Old Goats - Moisturizing lotion"  . PATADAY 0.2 % SOLN 1 drop in right eye every morning  . RESTASIS 0.05 % ophthalmic emulsion Place 1 drop into both eyes 2 (two) times daily.   . Sodium Bicarbonate-Citric Acid (ALKA-SELTZER HEARTBURN PO) Take by mouth as needed.  [name] telmisartan (MICARDIS) 40 MG tablet Take 1 tablet (40 mg total) by mouth daily. For blood  pressure  . vitamin C (ASCORBIC ACID) 500 MG tablet Take 500 mg by mouth daily.  . [DISCONTINUED] traMADol (ULTRAM) 50 MG tablet Take 1 tablet (50 mg total) by mouth every 6 (six) hours as needed for moderate pain or severe pain.   No facility-administered encounter medications on file as of 12/20/2017.     Review of Systems:  Review of Systems  Unable to perform ROS: Other (memory loss)    Health Maintenance  Topic Date Due  . FOOT EXAM  06/23/1943  . HEMOGLOBIN A1C  10/09/2014  . OPHTHALMOLOGY EXAM  03/29/2018  . TETANUS/TDAP  12/28/2018  . INFLUENZA VACCINE  Completed  . DEXA SCAN  Completed  . PNA vac Low Risk Adult  Completed    Physical Exam: Vitals:   12/20/17 1103  BP: 130/82  Pulse: 80  Temp: 98.5 F (36.9 C)  TempSrc: Oral  SpO2: 98%  Weight: 163 lb (73.9 kg)  Height: 5\' 1"  (1.549 m)   Body mass index is 30.8 kg/m. Physical Exam  HENT:  Mouth/Throat: Oropharynx is clear and moist. No oropharyngeal exudate.  MMM; no tongue lesions seen; no redness; internal lower lip slightly red but no white exudate noted on tongue or other mucosa. Upper dentures intact  Neck: Neck supple. Carotid bruit is present (right).  Cardiovascular: Normal rate, regular rhythm and intact distal pulses. Exam reveals no gallop and no friction rub.  Murmur (1/6 SEM) heard. No LE edema  Pulmonary/Chest: No respiratory distress. She has no wheezes. She has no rales. She exhibits no tenderness.  Abdominal: She exhibits distension. She exhibits no mass. There is tenderness (epigastric). There is no rebound and no guarding.  Musculoskeletal: She exhibits edema.  Lymphadenopathy:    She has no cervical adenopathy.  Neurological: She is alert.  Skin: Skin is warm and dry. No rash noted.  Psychiatric: She has a normal mood and affect. Her behavior is normal.    Labs reviewed: Basic Metabolic Panel: Recent Labs    04/02/17 1030 05/30/17 1111 11/22/17 0917  NA 142 142 141  K 5.1 3.4*  3.7  CL 107 110 107  CO2 21 21* 26  GLUCOSE 90 108* 99  BUN 14 8 12   CREATININE 0.89* 0.85 0.84  CALCIUM 9.3 9.6 9.5  TSH 0.81  --   --    Liver Function Tests: Recent Labs    04/02/17 1030 05/30/17 1110 11/22/17 0917  AST 29 23 17   ALT 12 14 10   ALKPHOS 50 54  --   BILITOT 0.4 0.5 0.4  PROT 7.1 7.6 7.1  ALBUMIN 3.8 4.0  --    Recent Labs    05/30/17 1110  LIPASE 12   No results for input(s): AMMONIA in the last 8760 hours. CBC: Recent Labs    04/02/17 1030 05/30/17 1111 11/22/17 0917  WBC 5.8 7.0 5.6  NEUTROABS 2,030  --  2,610  HGB 11.2* 12.0 12.2  HCT 35.5 35.2* 36.2  MCV 86.2 86.3 90.0  PLT 364 309 330   Lipid Panel: No results for input(s): CHOL, HDL, LDLCALC, TRIG, CHOLHDL, LDLDIRECT in the last 8760 hours. Lab Results  Component Value Date   HGBA1C 6.1 04/09/2014    Procedures since last visit: Dg Bone Density  Result Date: 12/02/2017 EXAM: DUAL X-RAY ABSORPTIOMETRY (DXA) FOR BONE MINERAL DENSITY IMPRESSION: Referring Physician:  Eulas Post, Icela Glymph PATIENT: Name: DANELY, BAYLISS Patient ID: [id number] Birth Date: 01-31-1931 Height: 61.0 in. Sex: Female Measured: 12/02/2017 Weight: 163.8 lbs. Indications: Advanced Age, cymbalta, Estrogen Deficient, Family Hist. (Parent hip fracture), Hysterectomy, Postmenopausal Fractures: None Treatments: Calcium (E943.0), Vitamin D (E933.5) ASSESSMENT: The BMD measured at AP Spine L1-L3 is 0.857 g/cm2 with a T-score of -2.6. This patient is considered osteoporotic according to [facility]) criteria. L-4 was excluded due to degenerative changes. There has been a statistically significant decrease in BMD of Left hip since prior exam dated 02/24/1999. Spine was not compared to prior study due to exclusion of vertebral bodies on current exam. Site Region Measured Date Measured Age YA BMD Significant CHANGE T-score AP Spine  L1-L3       14/02/2017    84.4         -2.6    0.857 g/cm2 DualFemur Total Right 12/02/2017     84.4         -2.4    0.703 g/cm2 World Health Organization [facility]) criteria for post-menopausal, Caucasian Women: Normal       T-score at or above -1 SD Osteopenia   T-score between -1 and -2.5 SD Osteoporosis T-score at or below -2.5 SD RECOMMENDATION: [place] recommends that FDA-approved medical therapies be considered in postmenopausal women and men age 82 or older with a: 1. Hip or vertebral (clinical or morphometric) fracture. 2. T-score of <-2.5 at the spine or hip. 3. Ten-year fracture probability by FRAX of 3% or greater for hip fracture or 20% or greater for major osteoporotic fracture. All treatment decisions require clinical judgment and consideration of individual patient factors, including patient preferences, co-morbidities, previous drug use, risk factors not captured in the FRAX model (e.g. falls, vitamin D deficiency, increased bone turnover, interval significant decline in bone density) and possible under - or over-estimation of fracture risk by FRAX. All patients should ensure an adequate intake of dietary calcium (1200 mg/d) and vitamin D (800 IU daily) unless contraindicated. FOLLOW-UP: People with diagnosed cases of osteoporosis or at high risk for fracture should have regular bone mineral density tests. For patients eligible for Medicare, routine testing is allowed once every 2 years. The testing frequency can be increased to one year for patients who have rapidly progressing disease, those who are receiving or discontinuing medical therapy to restore bone mass, or have additional risk factors. I have reviewed this report, and agree with the above findings. [facility] Radiology Electronically Signed   By: Kerby Moors M.D.   On: 12/02/2017 08:57    Assessment/Plan   ICD-10-CM   1. Insomnia, unspecified type G47.00 traZODone (DESYREL) 50 MG tablet  2. Paresthesias R20.2 nystatin (MYCOSTATIN) 100000 UNIT/ML suspension   mouth; possible oral candidiasis  3.  Gastroesophageal reflux disease without esophagitis K21.9   4. Epigastric pain R10.13 CT ABDOMEN WO CONTRAST  5. Urinary frequency R35.0   6. Anxiety F41.9     T/c ENT eval if tongue does not improve with mycostatin  START MYCOSTATIN SUSPENSION FOR ORAL THRUSH - SWISH AND SPIT 1 TEASPOON 4 DAILY X 7 DAYS  START TRAZODONE 50MG  - TAKE  1/2 TO 1 TAB AT BEDTIME TO HELP SLEEP AND REDUCE ANXIETY  Continue other medications as ordered  Follow up as scheduled or sooner if not feeling better  [facility] S. Perlie Gold  [facility] and Adult Medicine [address] [place], [place] [address] [phone] Cell (Monday-Friday 8 AM - 5 PM) [phone] After 5 PM and follow prompts

## 2017-12-20 NOTE — Patient Instructions (Addendum)
START MYCOSTATIN SUSPENSION FOR ORAL THRUSH - SWISH AND SPIT 1 TEASPOON 4 DAILY X 7 DAYS  START TRAZODONE 50MG  - TAKE 1/2 TO 1 TAB AT BEDTIME TO HELP SLEEP AND REDUCE ANXIETY  Continue other medications as ordered  Follow up as scheduled or sooner if not feeling better

## 2017-12-27 IMAGING — CR DG CHEST 2V
2 series · 2 of 2 positions shown · non-contrast
Comparison: 01/12/2017 .

CLINICAL DATA: Chest tightness.

EXAM:
CHEST  2 VIEW

[x chest ap]
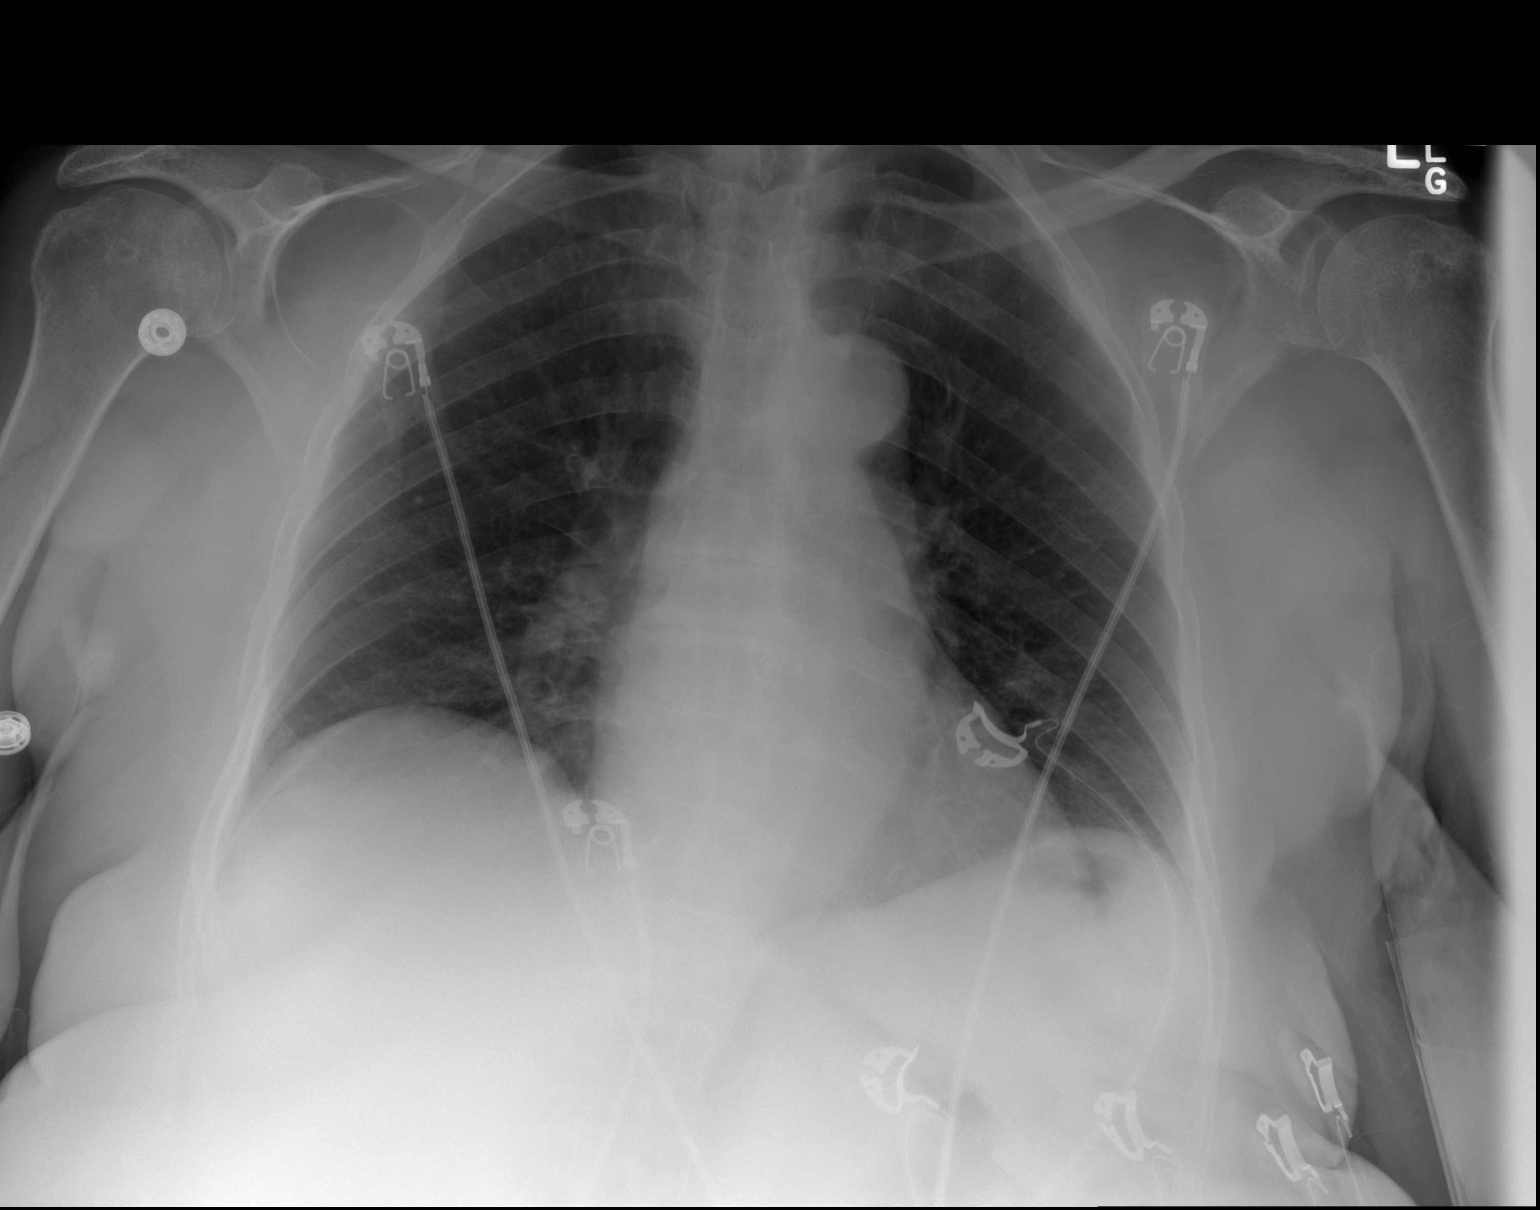

[w chest lat]
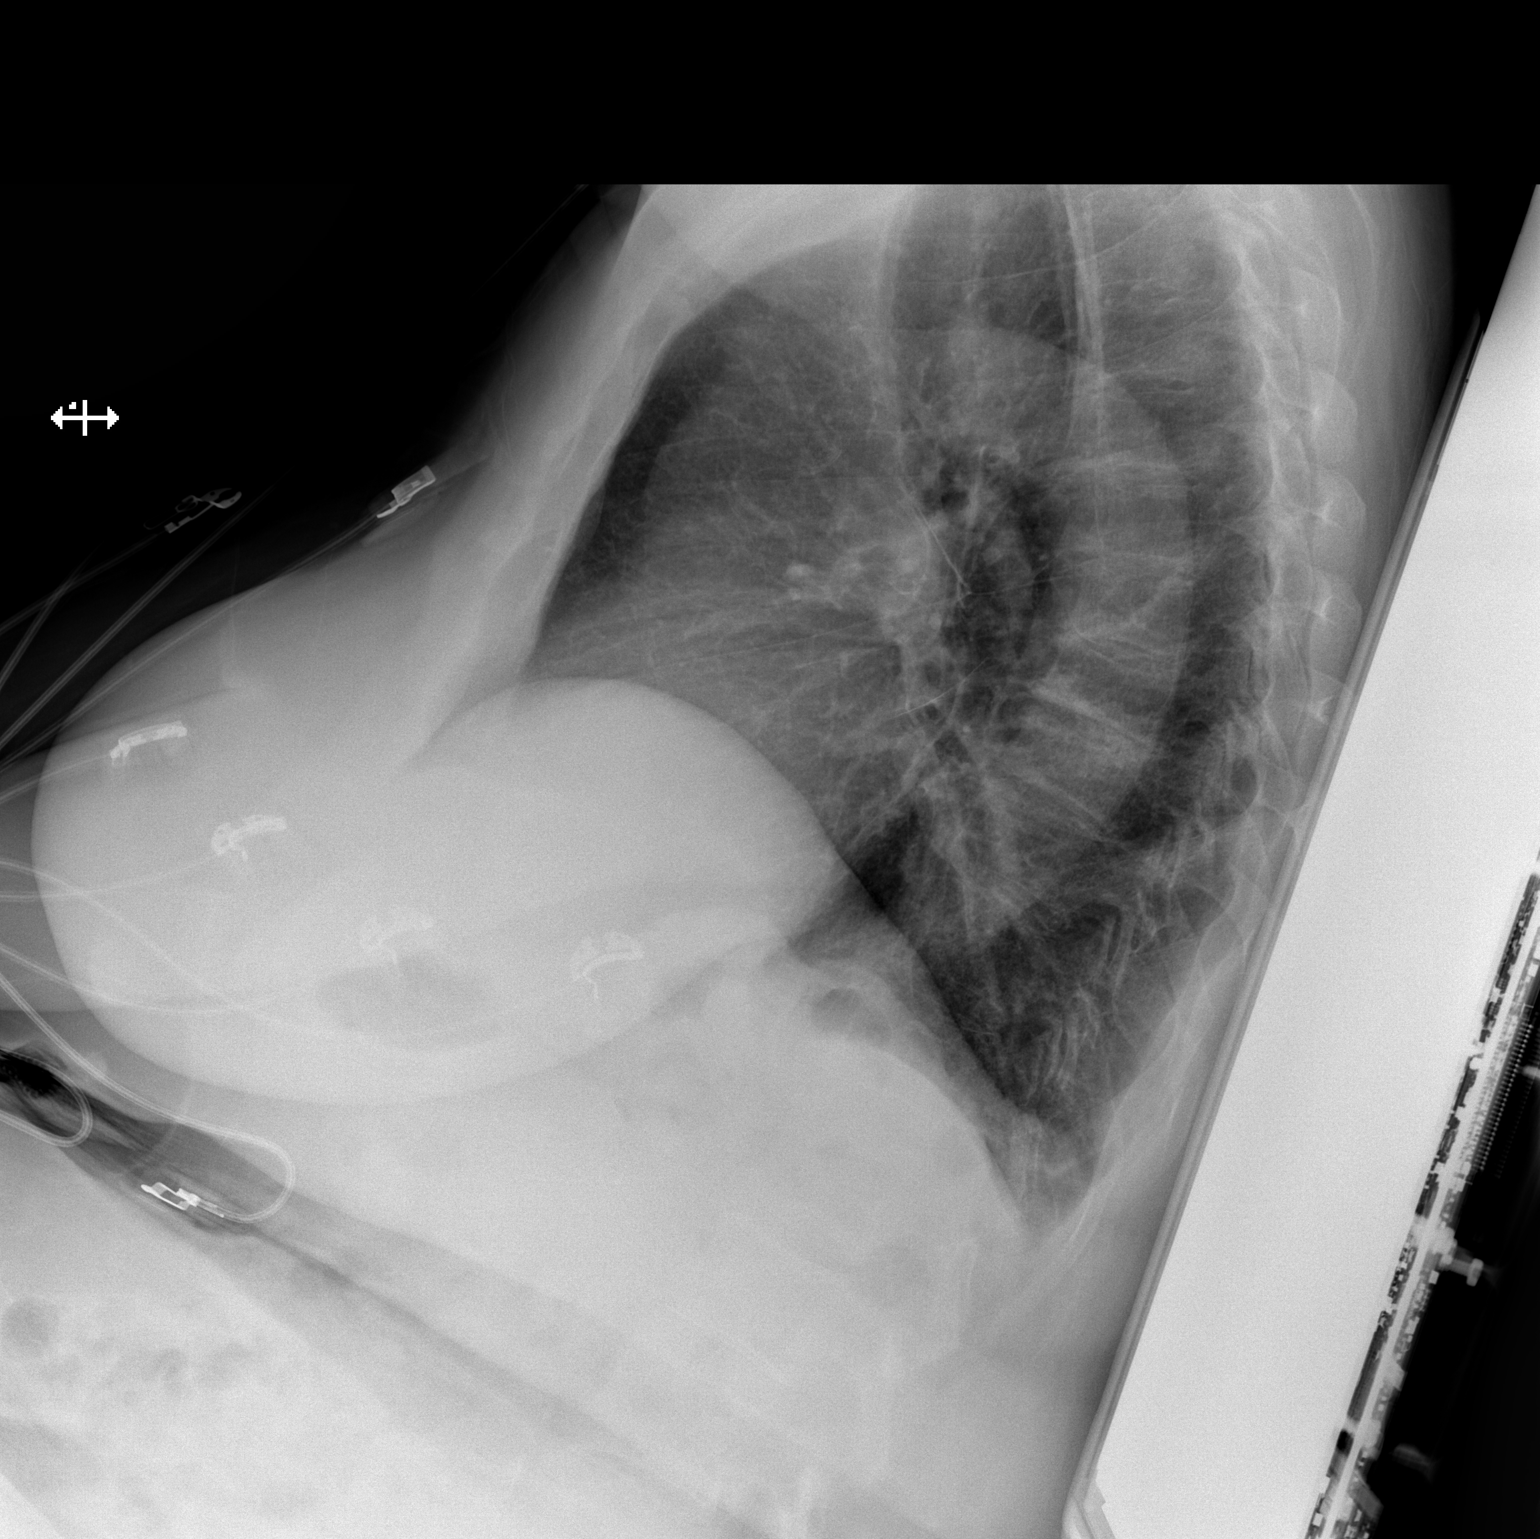

[2 of 2 positions shown; findings below may reference images not displayed]

FINDINGS: Mediastinum and hilar structures are normal. Heart size normal. No
focal infiltrate. No pleural effusion or pneumothorax. Mild thoracic
vertebral body stable compression fractures. No acute bony
abnormality .
IMPRESSION: No acute cardiopulmonary disease.

## 2018-01-08 ENCOUNTER — Telehealth: Payer: Self-pay | Admitting: *Deleted

## 2018-01-08 DIAGNOSIS — R1013 Epigastric pain: Secondary | ICD-10-CM

## 2018-01-08 NOTE — Telephone Encounter (Signed)
Miriam with [facility] called and stated that with Patient's diagnosis a CT Scan with Contrast should be ordered unless patient has an allergy or labs are abnormal. Pended order for Dr. Vale Haven approval.  Please Advise.

## 2018-01-29 ENCOUNTER — Ambulatory Visit
Admission: RE | Admit: 2018-01-29 | Discharge: 2018-01-29 | Disposition: A | Discharge status: Home / Self Care | Payer: PPO | Source: Ambulatory Visit | Attending: Internal Medicine | Admitting: Internal Medicine

## 2018-01-29 DIAGNOSIS — R1013 Epigastric pain: Secondary | ICD-10-CM

## 2018-01-29 DIAGNOSIS — R1033 Periumbilical pain: Secondary | ICD-10-CM | POA: Diagnosis not present

## 2018-01-29 MED ORDER — IOPAMIDOL (ISOVUE-300) INJECTION 61%
100.0000 mL | Freq: Once | INTRAVENOUS | Status: AC | PRN
  Administered 2018-01-29: 100 mL via INTRAVENOUS

## 2018-01-30 ENCOUNTER — Telehealth: Payer: Self-pay

## 2018-01-30 DIAGNOSIS — E785 Hyperlipidemia, unspecified: Secondary | ICD-10-CM

## 2018-01-30 DIAGNOSIS — Z79899 Other long term (current) drug therapy: Secondary | ICD-10-CM

## 2018-01-30 MED ORDER — ATORVASTATIN CALCIUM 10 MG PO TABS: 10.0000 mg | ORAL_TABLET | Freq: Every day | ORAL | 6 refills | Status: DC

## 2018-01-30 NOTE — Telephone Encounter (Signed)
-----   Message from [place], [place] sent at 01/30/2018 10:51 AM EDT ----- Start simvastatin 10mg  #30 take 1 tab po qhs with 6RF; check fasting lipid panel and ALT in 1 month (hyperlipidemia/high risk med use)

## 2018-01-30 NOTE — Telephone Encounter (Signed)
Medication list has been updated and rx was sent to pharmacy. Lab orders have been placed.

## 2018-03-03 ENCOUNTER — Other Ambulatory Visit: Payer: PPO

## 2018-03-03 DIAGNOSIS — Z79899 Other long term (current) drug therapy: Secondary | ICD-10-CM

## 2018-03-03 DIAGNOSIS — E785 Hyperlipidemia, unspecified: Secondary | ICD-10-CM

## 2018-03-03 LAB — ALT: ALT: 11 U/L (ref 6–29)

## 2018-03-03 LAB — LIPID PANEL
CHOL/HDL RATIO: 2.7 (calc) (ref <5.0)
CHOLESTEROL: 153 mg/dL (ref <200)
HDL: 57 mg/dL (ref >50)
LDL Cholesterol (Calc): 78 mg/dL (calc)
Non-HDL Cholesterol (Calc): 96 mg/dL (calc) (ref <130)
Triglycerides: 99 mg/dL (ref <150)

## 2018-03-26 DIAGNOSIS — H11823 Conjunctivochalasis, bilateral: Secondary | ICD-10-CM | POA: Diagnosis not present

## 2018-03-26 DIAGNOSIS — H353131 Nonexudative age-related macular degeneration, bilateral, early dry stage: Secondary | ICD-10-CM | POA: Diagnosis not present

## 2018-03-26 DIAGNOSIS — H43813 Vitreous degeneration, bilateral: Secondary | ICD-10-CM | POA: Diagnosis not present

## 2018-03-26 DIAGNOSIS — H04123 Dry eye syndrome of bilateral lacrimal glands: Secondary | ICD-10-CM | POA: Diagnosis not present

## 2018-03-26 DIAGNOSIS — H0100A Unspecified blepharitis right eye, upper and lower eyelids: Secondary | ICD-10-CM | POA: Diagnosis not present

## 2018-03-26 DIAGNOSIS — Z9841 Cataract extraction status, right eye: Secondary | ICD-10-CM | POA: Diagnosis not present

## 2018-03-26 DIAGNOSIS — H35362 Drusen (degenerative) of macula, left eye: Secondary | ICD-10-CM | POA: Diagnosis not present

## 2018-03-26 DIAGNOSIS — H11153 Pinguecula, bilateral: Secondary | ICD-10-CM | POA: Diagnosis not present

## 2018-03-26 DIAGNOSIS — H18413 Arcus senilis, bilateral: Secondary | ICD-10-CM | POA: Diagnosis not present

## 2018-03-26 DIAGNOSIS — H43811 Vitreous degeneration, right eye: Secondary | ICD-10-CM | POA: Diagnosis not present

## 2018-03-26 DIAGNOSIS — Z961 Presence of intraocular lens: Secondary | ICD-10-CM | POA: Diagnosis not present

## 2018-03-26 DIAGNOSIS — H0100B Unspecified blepharitis left eye, upper and lower eyelids: Secondary | ICD-10-CM | POA: Diagnosis not present

## 2018-06-12 DIAGNOSIS — H60392 Other infective otitis externa, left ear: Secondary | ICD-10-CM | POA: Diagnosis not present

## 2018-06-18 ENCOUNTER — Encounter: Payer: Self-pay | Admitting: Internal Medicine

## 2018-10-03 ENCOUNTER — Ambulatory Visit: Payer: PPO

## 2018-10-03 VITALS — BP 140/82 | HR 89 | Temp 98.2°F | Ht 61.0 in | Wt 169.0 lb

## 2018-10-03 DIAGNOSIS — Z0000 Encounter for general adult medical examination without abnormal findings: Secondary | ICD-10-CM | POA: Diagnosis not present

## 2018-10-03 MED ORDER — ZOSTER VAC RECOMB ADJUVANTED 50 MCG/0.5ML IM SUSR: 0.5000 mL | Freq: Once | INTRAMUSCULAR | 1 refills | Status: AC

## 2018-10-03 NOTE — Patient Instructions (Addendum)
Alexandria Beck , Thank you for taking time to come for your Medicare Wellness Visit. I appreciate your ongoing commitment to your health goals. Please review the following plan we discussed and let me know if I can assist you in the future.   Screening recommendations/referrals: Colonoscopy excluded, over age 82 Mammogram excluded, over age 23 Bone Density up to dte Recommended yearly ophthalmology/optometry visit for glaucoma screening and checkup Recommended yearly dental visit for hygiene and checkup  Vaccinations: Influenza vaccine up to date Pneumococcal vaccine up to date, completed Tdap vaccine up to date, due 12/28/2018 Shingles vaccine due, ordered to pharmacy  Advanced directives: Advance directive discussed with you today. I have provided a copy for you to complete at home and have notarized. Once this is complete please bring a copy in to our office so we can scan it into your chart.  Conditions/risks identified: none  Next appointment: [name], NP 10/09/2018 @1 ;30pm            Medicare Wellness Visit 10/05/2019 @ 10:30am  Preventive Care 65 Years and Older, Female Preventive care refers to lifestyle choices and visits with your health care provider that can promote health and wellness. What does preventive care include?  A yearly physical exam. This is also called an annual well check.  Dental exams once or twice a year.  Routine eye exams. Ask your health care provider how often you should have your eyes checked.  Personal lifestyle choices, including:  Daily care of your teeth and gums.  Regular physical activity.  Eating a healthy diet.  Avoiding tobacco and drug use.  Limiting alcohol use.  Practicing safe sex.  Taking low-dose aspirin every day.  Taking vitamin and mineral supplements as recommended by your health care provider. What happens during an annual well check? The services and screenings done by your health care provider during your  annual well check will depend on your age, overall health, lifestyle risk factors, and family history of disease. Counseling  Your health care provider may ask you questions about your:  Alcohol use.  Tobacco use.  Drug use.  Emotional well-being.  Home and relationship well-being.  Sexual activity.  Eating habits.  History of falls.  Memory and ability to understand (cognition).  Work and work Statistician.  Reproductive health. Screening  You may have the following tests or measurements:  Height, weight, and BMI.  Blood pressure.  Lipid and cholesterol levels. These may be checked every 5 years, or more frequently if you are over 1 years old.  Skin check.  Lung cancer screening. You may have this screening every year starting at age 65 if you have a 30-pack-year history of smoking and currently smoke or have quit within the past 15 years.  Fecal occult blood test (FOBT) of the stool. You may have this test every year starting at age 63.  Flexible sigmoidoscopy or colonoscopy. You may have a sigmoidoscopy every 5 years or a colonoscopy every 10 years starting at age 51.  Hepatitis C blood test.  Hepatitis B blood test.  Sexually transmitted disease (STD) testing.  Diabetes screening. This is done by checking your blood sugar (glucose) after you have not eaten for a while (fasting). You may have this done every 1-3 years.  Bone density scan. This is done to screen for osteoporosis. You may have this done starting at age 28.  Mammogram. This may be done every 1-2 years. Talk to your health care provider about how often you should have  regular mammograms. Talk with your health care provider about your test results, treatment options, and if necessary, the need for more tests. Vaccines  Your health care provider may recommend certain vaccines, such as:  Influenza vaccine. This is recommended every year.  Tetanus, diphtheria, and acellular pertussis (Tdap, Td)  vaccine. You may need a Td booster every 10 years.  Zoster vaccine. You may need this after age 2.  Pneumococcal 13-valent conjugate (PCV13) vaccine. One dose is recommended after age 46.  Pneumococcal polysaccharide (PPSV23) vaccine. One dose is recommended after age 75. Talk to your health care provider about which screenings and vaccines you need and how often you need them. This information is not intended to replace advice given to you by your health care provider. Make sure you discuss any questions you have with your health care provider. Document Released: 11/11/2015 Document Revised: 07/04/2016 Document Reviewed: 08/16/2015 Elsevier Interactive Patient Education  2017 [employer] Prevention in the Home Falls can cause injuries. They can happen to people of all ages. There are many things you can do to make your home safe and to help prevent falls. What can I do on the outside of my home?  Regularly fix the edges of walkways and driveways and fix any cracks.  Remove anything that might make you trip as you walk through a door, such as a raised step or threshold.  Trim any bushes or trees on the path to your home.  Use bright outdoor lighting.  Clear any walking paths of anything that might make someone trip, such as rocks or tools.  Regularly check to see if handrails are loose or broken. Make sure that both sides of any steps have handrails.  Any raised decks and porches should have guardrails on the edges.  Have any leaves, snow, or ice cleared regularly.  Use sand or salt on walking paths during winter.  Clean up any spills in your garage right away. This includes oil or grease spills. What can I do in the bathroom?  Use night lights.  Install grab bars by the toilet and in the tub and shower. Do not use towel bars as grab bars.  Use non-skid mats or decals in the tub or shower.  If you need to sit down in the shower, use a plastic, non-slip  stool.  Keep the floor dry. Clean up any water that spills on the floor as soon as it happens.  Remove soap buildup in the tub or shower regularly.  Attach bath mats securely with double-sided non-slip rug tape.  Do not have throw rugs and other things on the floor that can make you trip. What can I do in the bedroom?  Use night lights.  Make sure that you have a light by your bed that is easy to reach.  Do not use any sheets or blankets that are too big for your bed. They should not hang down onto the floor.  Have a firm chair that has side arms. You can use this for support while you get dressed.  Do not have throw rugs and other things on the floor that can make you trip. What can I do in the kitchen?  Clean up any spills right away.  Avoid walking on wet floors.  Keep items that you use a lot in easy-to-reach places.  If you need to reach something above you, use a strong step stool that has a grab bar.  Keep electrical cords out of the  way.  Do not use floor polish or wax that makes floors slippery. If you must use wax, use non-skid floor wax.  Do not have throw rugs and other things on the floor that can make you trip. What can I do with my stairs?  Do not leave any items on the stairs.  Make sure that there are handrails on both sides of the stairs and use them. Fix handrails that are broken or loose. Make sure that handrails are as long as the stairways.  Check any carpeting to make sure that it is firmly attached to the stairs. Fix any carpet that is loose or worn.  Avoid having throw rugs at the top or bottom of the stairs. If you do have throw rugs, attach them to the floor with carpet tape.  Make sure that you have a light switch at the top of the stairs and the bottom of the stairs. If you do not have them, ask someone to add them for you. What else can I do to help prevent falls?  Wear shoes that:  Do not have high heels.  Have rubber bottoms.  Are  comfortable and fit you well.  Are closed at the toe. Do not wear sandals.  If you use a stepladder:  Make sure that it is fully opened. Do not climb a closed stepladder.  Make sure that both sides of the stepladder are locked into place.  Ask someone to hold it for you, if possible.  Clearly mark and make sure that you can see:  Any grab bars or handrails.  First and last steps.  Where the edge of each step is.  Use tools that help you move around (mobility aids) if they are needed. These include:  Canes.  Walkers.  Scooters.  Crutches.  Turn on the lights when you go into a dark area. Replace any light bulbs as soon as they burn out.  Set up your furniture so you have a clear path. Avoid moving your furniture around.  If any of your floors are uneven, fix them.  If there are any pets around you, be aware of where they are.  Review your medicines with your doctor. Some medicines can make you feel dizzy. This can increase your chance of falling. Ask your doctor what other things that you can do to help prevent falls. This information is not intended to replace advice given to you by your health care provider. Make sure you discuss any questions you have with your health care provider. Document Released: 08/11/2009 Document Revised: 03/22/2016 Document Reviewed: 11/19/2014 Elsevier Interactive Patient Education  2017 [employer].

## 2018-10-03 NOTE — Progress Notes (Signed)
Subjective:   Alexandria Beck is a 84 y.o. female who presents for Medicare Annual (Subsequent) preventive examination.    Objective:     Vitals: BP 140/82 (BP Location: Left Arm, Patient Position: Sitting)   Pulse 89   Temp 98.2 F (36.8 C) (Oral)   Ht 5\' 1"  (1.549 m)   Wt 169 lb (76.7 kg)   SpO2 98%   BMI 31.93 kg/m   Body mass index is 31.93 kg/m.  Advanced Directives 10/03/2018 09/25/2017 09/17/2017 08/28/2017 07/03/2017 05/30/2017 04/13/2017  Does Patient Have a Medical Advance Directive? No No No No No No Yes  Type of Advance Directive - - - - - - Press photographer;Living will  Does patient want to make changes to medical advance directive? - - - - - - -  Copy of [place] in Chart? - - - - - - No - copy requested  Would patient like information on creating a medical advance directive? Yes (MAU/Ambulatory/Procedural Areas - Information given) Yes (MAU/Ambulatory/Procedural Areas - Information given) Yes (MAU/Ambulatory/Procedural Areas - Information given) - - - No - Patient declined    Tobacco Social History   Tobacco Use  Smoking Status Never Smoker  Smokeless Tobacco Never Used     Counseling given: Not Answered   Clinical Intake:  Pre-visit preparation completed: No  Pain : No/denies pain     Diabetes: No  How often do you need to have someone help you when you read instructions, pamphlets, or other written materials from your doctor or pharmacy?: 1 - Never What is the last grade level you completed in school?: 8th grade  Interpreter Needed?: No  Information entered by :: Tyson Dense, RN  Past Medical History:  Diagnosis Date  . Allergy    seasonal  . Diverticulosis 2004  . Esophageal stricture   . GERD (gastroesophageal reflux disease)    has had EGD 2006-negative  . Helicobacter positive gastritis 2006  . Hypertension     On no medication. Home readings SBP=150's  . Internal hemorrhoids   . Irregular heart beat    no testing, holter, etc. Takes no medication  . Near syncope    diagnosed as anxiety related. gets some relief with clonazepam. No formal testing done.   Past Surgical History:  Procedure Laterality Date  . ABDOMINAL HYSTERECTOMY  1974   fibroid tumors w/ metorrhagia  . Robert.[place] - NSVD  midwife delivery for two    . TONSILLECTOMY    . TONSILLECTOMY     Family History  Problem Relation Age of Onset  . Heart disease Mother   . Stroke Mother   . Hypertension Mother   . Cancer Father        lung cancer - smoker  . Heart disease Sister        MI  . Kidney disease Sister        end stage renal disease on HD  . Diabetes Sister   . Heart disease Brother   . Diabetes Sister   . Hypertension Sister   . Kidney disease Sister   . Cancer Sister   . Diabetes Sister   . Kidney disease Sister        HD  . Hypertension Sister   . Heart disease Sister   . Hypertension Sister   . Heart disease Sister   . Hypertension Sister   . Hypertension Sister    Social History   Socioeconomic History  . Marital status: Widowed  Spouse name: Not on file  . Number of children: 4  . Years of education: 69  . Highest education level: Not on file  Occupational History  . 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 Financial resource strain: Not very hard  . Food insecurity:    Worry: Never true    Inability: Never true  . Transportation needs:    Medical: No    Non-medical: No  Tobacco Use  . Smoking status: Never Smoker  . Smokeless tobacco: Never Used  Substance and Sexual Activity  . Alcohol use: No    Alcohol/week: 0.0 standard drinks  . Drug use: No  . Sexual activity: Not Currently  Lifestyle  . Physical activity:    Days per week: 2 days    Minutes per session: 20 min  . Stress: Only a little  Relationships  . Social connections:    Talks on phone: More than three times a week    Gets together: Three times a week    Attends religious service: 1 to 4 times  per year    Active member of club or organization: Yes    Attends meetings of clubs or organizations: 1 to 4 times per year    Relationship status: Widowed  Other Topics Concern  . Not on file  Social History Narrative   Diet:      Do you drink/ eat things with caffeine? yes      Marital status:   widow                            What year were you married ?  1953      Do you live in a house, apartment,assistred living, condo, trailer, etc.)? house      Is it one or more stories? 1 story      How many persons live in your home ?2      Do you have any pets in your home ?(please list)  none      Current or past profession: [employer]      Do you exercise?  yes                            Type & how often:  walking      Do you have a living will?  no      Do you have a DNR form?     no                  If not, do you want to discuss one?       Do you have signed POA?HPOA forms?  no               If so, please bring to your        appointment                      Outpatient Encounter Medications as of 10/03/2018  Medication Sig  . atorvastatin (LIPITOR) 10 MG tablet Take 1 tablet (10 mg total) by mouth at bedtime.  . cetirizine (ZYRTEC) 5 MG tablet Take 1 tablet (5 mg total) by mouth daily. allergies  . diazepam (VALIUM) 5 MG tablet Take 1 tablet (5 mg total) by mouth every 8 (eight) hours as needed for anxiety.  . DULoxetine (CYMBALTA) 60 MG capsule Take 1  capsule (60 mg total) by mouth daily. For anxiety  . ergocalciferol (VITAMIN D2) 50000 units capsule Take 50,000 Units by mouth once a week.  . neomycin-polymyxin-hydrocortisone (CORTISPORIN) OTIC solution Place 3 drops into the left ear 4 (four) times daily.  [name] OVER THE COUNTER MEDICATION Apply 1 application topically daily. "Two Old Goats - Moisturizing lotion"  . PATADAY 0.2 % SOLN 1 drop in right eye every morning  . RESTASIS 0.05 % ophthalmic emulsion Place 1 drop into both eyes 2 (two) times daily.   . Sodium  Bicarbonate-Citric Acid (ALKA-SELTZER HEARTBURN PO) Take by mouth as needed.  [name] telmisartan (MICARDIS) 40 MG tablet Take 1 tablet (40 mg total) by mouth daily. For blood pressure  . vitamin C (ASCORBIC ACID) 500 MG tablet Take 500 mg by mouth daily.  [name] Zoster Vaccine Adjuvanted [facility]) injection Inject 0.5 mLs into the muscle once for 1 dose.  . [DISCONTINUED] Zoster Vaccine Adjuvanted [facility]) injection Inject 0.5 mLs into the muscle once.  . traZODone (DESYREL) 50 MG tablet Take 0.5-1 tablets (25-50 mg total) by mouth at bedtime as needed for sleep. (Patient not taking: Reported on 10/03/2018)  . [DISCONTINUED] Melatonin 5 MG CHEW Chew 1 tablet by mouth at bedtime. To help sleep   No facility-administered encounter medications on file as of 10/03/2018.     Activities of Daily Living In your present state of health, do you have any difficulty performing the following activities: 10/03/2018  Hearing? N  Vision? N  Difficulty concentrating or making decisions? N  Walking or climbing stairs? N  Dressing or bathing? N  Doing errands, shopping? N  Preparing Food and eating ? N  Using the Toilet? N  In the past six months, have you accidently leaked urine? N  Do you have problems with loss of bowel control? N  Managing your Medications? N  Managing your Finances? N  Housekeeping or managing your Housekeeping? N  Some recent data might be hidden    Patient Care Team: Gildardo Cranker, DO as PCP - General (Internal Medicine) [name] Darrick Penna, MD as Consulting Physician (Internal Medicine) Shirley Muscat Loreen Freud, MD as Referring Physician (Optometry)    Assessment:   This is a routine wellness examination for Dyanna.  Exercise Activities and Dietary recommendations Current Exercise Habits: Home exercise routine, Type of exercise: walking, Time (Minutes): 20, Frequency (Times/Week): 2, Weekly Exercise (Minutes/Week): 40, Intensity: Mild, Exercise limited by: None identified  Goals    .  Exercise 3x per week (20 min per time)     Paitent will walk a couple blocks a day    . Increase water intake     Starting 1115/17, I will attempt to increase my water intake from 3 bottles to 4 bottles per day.        Fall Risk Fall Risk  10/03/2018 12/20/2017 11/22/2017 09/25/2017 09/17/2017  Falls in the past year? 0 Yes No Yes Yes  Number falls in past yr: 0 1 - 1 1  Injury with Fall? 0 No - No No   Is the patient's home free of loose throw rugs in walkways, pet beds, electrical cords, etc?   yes      Grab bars in the bathroom? yes      Handrails on the stairs?   yes      Adequate lighting?   yes  Depression Screen PHQ 2/9 Scores 10/03/2018 11/22/2017 09/17/2017 09/12/2016  PHQ - 2 Score 1 0 0 0  PHQ- 9 Score - - - -  Cognitive Function MMSE - Mini Mental State Exam 10/03/2018 04/02/2017 04/02/2017 09/12/2016  Orientation to time 4 5 5 5   Orientation to Place 5 5 5 5   Registration 3 3 3 3   Attention/ Calculation 3 4 4 1   Recall 0 2 2 3   Language- name 2 objects 2 2 2 2   Language- repeat 1 1 1 1   Language- follow 3 step command 3 3 3 3   Language- read & follow direction 1 1 1 1   Write a sentence 0 1 1 1   Copy design 1 0 0 0  Total score 23 27 27 25         Immunization History  Administered Date(s) Administered  . Influenza Split 07/30/2011  . Influenza,inj,Quad PF,6+ Mos 07/13/2014, 06/20/2016, 07/03/2017  . Influenza-Unspecified 07/29/2018  . Pneumococcal Conjugate-13 09/23/2013  . Pneumococcal Polysaccharide-23 09/12/2016  . Zoster 09/29/2015    Qualifies for Shingles Vaccine? Yes, educated and ordered to pharmacy  Screening Tests Health Maintenance  Topic Date Due  . FOOT EXAM  06/23/1941  . HEMOGLOBIN A1C  10/09/2014  . OPHTHALMOLOGY EXAM  03/29/2018  . TETANUS/TDAP  12/28/2018  . INFLUENZA VACCINE  Completed  . DEXA SCAN  Completed  . PNA vac Low Risk Adult  Completed    Cancer Screenings: Lung: Low Dose CT Chest recommended if Age 66-80 years, 30  pack-year currently smoking OR have quit w/in 15years. Patient does not qualify. Breast:  Up to date on Mammogram? Yes   Up to date of Bone Density/Dexa? Yes Colorectal: up to date  Additional Screenings:  Hepatitis C Screening: declined     Plan:    I have personally reviewed and addressed the Medicare Annual Wellness questionnaire and have noted the following in the patient's chart:  A. Medical and social history B. Use of alcohol, tobacco or illicit drugs  C. Current medications and supplements D. Functional ability and status E.  Nutritional status F.  Physical activity G. Advance directives H. List of other physicians I.  Hospitalizations, surgeries, and ER visits in previous 12 months J.  [place] to include hearing, vision, cognitive, depression L. Referrals and appointments - none  In addition, I have reviewed and discussed with patient certain preventive protocols, quality metrics, and best practice recommendations. A written personalized care plan for preventive services as well as general preventive health recommendations were provided to patient.  See attached scanned questionnaire for additional information.   Signed,   Tyson Dense, RN Nurse Health Advisor  Patient concerns: Needs pain medication for arthritis pain. Requested different sleep medication-she doesn't like Trazodone. Cymbalta is not helping with anxiety-requested new  med

## 2018-10-17 ENCOUNTER — Telehealth: Payer: Self-pay

## 2018-10-17 ENCOUNTER — Encounter: Payer: Self-pay | Admitting: Nurse Practitioner

## 2018-10-17 ENCOUNTER — Other Ambulatory Visit: Payer: Self-pay | Admitting: Nurse Practitioner

## 2018-10-17 ENCOUNTER — Ambulatory Visit (INDEPENDENT_AMBULATORY_CARE_PROVIDER_SITE_OTHER): Payer: PPO | Admitting: Nurse Practitioner

## 2018-10-17 VITALS — BP 140/88 | HR 75 | Temp 98.0°F | Ht 61.0 in | Wt 167.2 lb

## 2018-10-17 DIAGNOSIS — E785 Hyperlipidemia, unspecified: Secondary | ICD-10-CM

## 2018-10-17 DIAGNOSIS — G4700 Insomnia, unspecified: Secondary | ICD-10-CM | POA: Diagnosis not present

## 2018-10-17 DIAGNOSIS — R413 Other amnesia: Secondary | ICD-10-CM

## 2018-10-17 DIAGNOSIS — F411 Generalized anxiety disorder: Secondary | ICD-10-CM

## 2018-10-17 DIAGNOSIS — M8580 Other specified disorders of bone density and structure, unspecified site: Secondary | ICD-10-CM | POA: Diagnosis not present

## 2018-10-17 DIAGNOSIS — I10 Essential (primary) hypertension: Secondary | ICD-10-CM

## 2018-10-17 MED ORDER — DULOXETINE HCL 30 MG PO CPEP: 30.0000 mg | ORAL_CAPSULE | Freq: Every day | ORAL | 1 refills | Status: DC

## 2018-10-17 MED ORDER — ATORVASTATIN CALCIUM 10 MG PO TABS: 10.0000 mg | ORAL_TABLET | Freq: Every day | ORAL | 1 refills | Status: DC

## 2018-10-17 MED ORDER — TELMISARTAN 40 MG PO TABS: 40.0000 mg | ORAL_TABLET | Freq: Every day | ORAL | 1 refills | Status: DC

## 2018-10-17 NOTE — Progress Notes (Signed)
Careteam: Patient Care Team: Lauree Chandler, NP as PCP - General (Geriatric Medicine) Hendricks Limes, MD as Consulting Physician (Internal Medicine) Shirley Muscat Loreen Freud, MD as Referring Physician (Optometry)  Advanced Directive information Does Patient Have a Medical Advance Directive?: No  Allergies  Allergen Reactions  . Clonazepam Swelling and Other (See Comments)    Tongue thickening sensation  . Codeine Rash  . Lexapro [Escitalopram Oxalate] Other (See Comments)    Made her "feel funny"  . Aspirin Nausea And Vomiting and Other (See Comments)    Stomach upset    Chief Complaint  Patient presents with  . Acute Visit    Problems with arthritis, c/o pain all over treating with tylenol but not helping     HPI: Patient is a 84 y.o. female seen in the office today due to "arthritis".  Reports she is having "tingling and burning arthritis" but not in a specific location.  Mostly bothers her in the evening and related to her "nerves" "needs something to calm my nerves" No pain currently.   Took her granddaughters xanax.  Has valium Rx but reports this does not help much and does not take.  Has Rx for cymbalta but has not refilled since 2018, "not taking that"  Memory loss- not taking medication routinely.   MMSE - Mini Mental State Exam 10/03/2018 04/02/2017 04/02/2017  Orientation to time 4 5 5   Orientation to Place 5 5 5   Registration 3 3 3   Attention/ Calculation 3 4 4   Recall 0 2 2  Language- name 2 objects 2 2 2   Language- repeat 1 1 1   Language- follow 3 step command 3 3 3   Language- read & follow direction 1 1 1   Write a sentence 0 1 1  Copy design 1 0 0  Total score 23 27 27    Lives alone and son comes by sometimes. Does every thing for myself.  htn- last Rx for telmisartan was in 2018 with #30 with 6 rf on 9//02/2017, reports she has not taken this morning  Insomnia- not taking trazodone, gives her bad feelings after she takes.   Anxiety- gets  tense when she is alone and at night. Took her granddaughters xanax.  Has valium Rx but reports this does not help much and does not take.  Has Rx for cymbalta but has not refilled since 2018, "not taking that"  Review of Systems:  Review of Systems  Constitutional: Negative for chills, fever and weight loss.  HENT: Negative for tinnitus.   Respiratory: Negative for cough, sputum production and shortness of breath.   Cardiovascular: Negative for chest pain, palpitations and leg swelling.  Musculoskeletal: Positive for back pain and myalgias. Negative for falls, joint pain and neck pain.  Skin: Negative.   Neurological: Positive for tingling. Negative for dizziness, weakness and headaches.  Psychiatric/Behavioral: Positive for memory loss. Negative for depression.    Past Medical History:  Diagnosis Date  . Allergy    seasonal  . Diverticulosis 2004  . Esophageal stricture   . GERD (gastroesophageal reflux disease)    has had EGD 2006-negative  . Helicobacter positive gastritis 2006  . Hypertension     On no medication. Home readings SBP=150's  . Internal hemorrhoids   . Irregular heart beat    no testing, holter, etc. Takes no medication  . Near syncope    diagnosed as anxiety related. gets some relief with clonazepam. No formal testing done.   Past Surgical History:  Procedure  Laterality Date  . ABDOMINAL HYSTERECTOMY  1974   fibroid tumors w/ metorrhagia  . Robert.Gallant - NSVD  midwife delivery for two    . TONSILLECTOMY    . TONSILLECTOMY     Social History:   reports that she has never smoked. She has never used smokeless tobacco. She reports that she does not drink alcohol or use drugs.  Family History  Problem Relation Age of Onset  . Heart disease Mother   . Stroke Mother   . Hypertension Mother   . Cancer Father        lung cancer - smoker  . Heart disease Sister        MI  . Kidney disease Sister        end stage renal disease on HD  . Diabetes Sister   .  Heart disease Brother   . Diabetes Sister   . Hypertension Sister   . Kidney disease Sister   . Cancer Sister   . Diabetes Sister   . Kidney disease Sister        HD  . Hypertension Sister   . Heart disease Sister   . Hypertension Sister   . Heart disease Sister   . Hypertension Sister   . Hypertension Sister     Medications: Patient's Medications  New Prescriptions   No medications on file  Previous Medications   ATORVASTATIN (LIPITOR) 10 MG TABLET    Take 1 tablet (10 mg total) by mouth at bedtime.   CETIRIZINE (ZYRTEC) 5 MG TABLET    Take 1 tablet (5 mg total) by mouth daily. allergies   DIAZEPAM (VALIUM) 5 MG TABLET    Take 1 tablet (5 mg total) by mouth every 8 (eight) hours as needed for anxiety.   DULOXETINE (CYMBALTA) 60 MG CAPSULE    Take 1 capsule (60 mg total) by mouth daily. For anxiety   ERGOCALCIFEROL (VITAMIN D2) 50000 UNITS CAPSULE    Take 50,000 Units by mouth once a week.   NEOMYCIN-POLYMYXIN-HYDROCORTISONE (CORTISPORIN) OTIC SOLUTION    Place 3 drops into the left ear 4 (four) times daily.   OVER THE COUNTER MEDICATION    Apply 1 application topically daily. "Two [employer]ats - Moisturizing lotion"   PATADAY 0.2 % SOLN    1 drop in right eye every morning   RESTASIS 0.05 % OPHTHALMIC EMULSION    Place 1 drop into both eyes 2 (two) times daily.    SODIUM BICARBONATE-CITRIC ACID (ALKA-SELTZER HEARTBURN PO)    Take by mouth as needed.   TELMISARTAN (MICARDIS) 40 MG TABLET    Take 1 tablet (40 mg total) by mouth daily. For blood pressure   TRAZODONE (DESYREL) 50 MG TABLET    Take 0.5-1 tablets (25-50 mg total) by mouth at bedtime as needed for sleep.   VITAMIN C (ASCORBIC ACID) 500 MG TABLET    Take 500 mg by mouth daily.  Modified Medications   No medications on file  Discontinued Medications   No medications on file     Physical Exam:  Vitals:   10/17/18 0813  BP: 140/88  Pulse: 75  Temp: 98 F (36.7 C)  TempSrc: Oral  SpO2: 98%  Weight: 167 lb 3.2 oz  (75.8 kg)  Height: 5\' 1"  (1.549 m)   Body mass index is 31.59 kg/m.  Physical Exam Constitutional:      Appearance: Normal appearance. She is well-developed.  HENT:     Mouth/Throat:     Pharynx: No oropharyngeal  exudate.  Eyes:     General: No scleral icterus.    Pupils: Pupils are equal, round, and reactive to light.  Neck:     Musculoskeletal: Neck supple.     Thyroid: No thyromegaly.     Vascular: No carotid bruit.     Trachea: No tracheal deviation.  Cardiovascular:     Rate and Rhythm: Normal rate and regular rhythm.     Heart sounds: Murmur (1/6 SEM) present. No friction rub. No gallop.      Comments:   Pulmonary:     Effort: Pulmonary effort is normal. No respiratory distress.     Breath sounds: Normal breath sounds. No stridor. No wheezing or rales.  Abdominal:     General: Bowel sounds are normal. There is no distension.     Palpations: Abdomen is soft. Abdomen is not rigid. There is no hepatomegaly or mass.     Tenderness: There is no abdominal tenderness. There is no guarding or rebound.     Hernia: No hernia is present.  Musculoskeletal:        General: No tenderness.     Right lower leg: No edema.     Left lower leg: No edema.  Lymphadenopathy:     Cervical: No cervical adenopathy.  Skin:    General: Skin is warm and dry.     Findings: No rash.  Neurological:     Mental Status: She is alert and oriented to person, place, and time.     Comments: Gait antalgic  Psychiatric:        Behavior: Behavior normal.        Thought Content: Thought content normal.        Judgment: Judgment normal.     Labs reviewed: Basic Metabolic Panel: Recent Labs    11/22/17 0917  NA 141  K 3.7  CL 107  CO2 26  GLUCOSE 99  BUN 12  CREATININE 0.84  CALCIUM 9.5   Liver Function Tests: Recent Labs    11/22/17 0917 03/03/18 0846  AST 17  --   ALT 10 11  BILITOT 0.4  --   PROT 7.1  --    No results for input(s): LIPASE, AMYLASE in the last 8760 hours. No  results for input(s): AMMONIA in the last 8760 hours. CBC: Recent Labs    11/22/17 0917  WBC 5.6  NEUTROABS 2,610  HGB 12.2  HCT 36.2  MCV 90.0  PLT 330   Lipid Panel: Recent Labs    03/03/18 0846  CHOL 153  HDL 57  LDLCALC 78  TRIG 99  CHOLHDL 2.7   TSH: No results for input(s): TSH in the last 8760 hours. A1C: Lab Results  Component Value Date   HGBA1C 6.1 04/09/2014     Assessment/Plan 1. Generalized anxiety disorder Worsening anxiety, not taking her medication. Will restart cymbalta at this time.  - DULoxetine (CYMBALTA) 30 MG capsule; Take 1 capsule (30 mg total) by mouth daily. For anxiety  Dispense: 30 capsule; Refill: 1  2. Hyperlipidemia, unspecified hyperlipidemia type -not taking lipitor  - Lipid Panel - COMPLETE METABOLIC PANEL WITH GFR - atorvastatin (LIPITOR) 10 MG tablet; Take 1 tablet (10 mg total) by mouth at bedtime.  Dispense: 30 tablet; Refill: 1  3. Insomnia, unspecified type -to use melatonin 5 mg by mouth at bedtime  4. Essential hypertension -elevated today but not on blood pressure medication.  - CBC with Differential/Platelets - telmisartan (MICARDIS) 40 MG tablet; Take 1 tablet (40 mg  total) by mouth daily. For blood pressure  Dispense: 30 tablet; Refill: 1  5. Osteopenia, unspecified location -she is interested in prolia at this time.  -will get prior authorization.  -continue vit d supplement.   6. Memory loss -MMSE 23/30 on 09/2018, which is significant decline from previous. She is very inconsistent with hx. Family notified due to concern, she has not been taking medication. Will get further lab work today and CT head to evaluate. Recommended for her not to drive at this time and for family to come with her with her for upcoming OV.  - TSH - B12 and Folate Panel - RPR - CT Head Wo Contrast; Future  Next appt: 3 months, sooner if needed for follow up with family  Carlos American. [place], [facility] [phone]

## 2018-10-17 NOTE — Telephone Encounter (Signed)
Alexandria Beck

## 2018-10-17 NOTE — Patient Instructions (Addendum)
Have someone look over your medication and help you with them and keeping up with refills Please bring someone with you to your next office visit. We have noticed some memory loss. We are obtaining labs today for this and recommend CT of the head  Osteopenia-  Bone density was done in Feb 2019 and reveals osteoporosis and increased risk of fragility fracture - take Vitamin D as ordered; recommend prolia injections   Anxiety- To restart Cymbalta 30 mg by mouth daily-- make sure you take this medication every day for the most benefit. Do not recommend xanax as it has bad side effects in the elderly  Insomnia- To use melatonin 5 mg by mouth every night for sleep   Hight blood pressure- Make sure you are taking your blood pressure medication every day.   Follow up in 3 months for routine follow up

## 2018-10-17 NOTE — Telephone Encounter (Signed)
Spoke with daughter and granddaughter about Jessica's concern with patient's memory loss. Patient has not been taking medication or keeping up with refills. Provider expressed concern for patient and need to have family present at all appointments going forward. Granddaughter would like to be primary contact for patient and any concerns. She stated that someone will be with patient at next appointment. She also stated that she has noticed changes in patient's memory. Discussed with family to schedule an appointment for patient with Janett Billow and she was willing to answer any further questions or concerns with them. Told family to please not let patient continue to drive due to memory concerns.

## 2018-10-17 NOTE — Telephone Encounter (Signed)
Noted, thank you

## 2018-10-20 LAB — CBC WITH DIFFERENTIAL/PLATELET
Absolute Monocytes: 467 cells/uL (ref 200–950)
Basophils Absolute: 32 cells/uL (ref 0–200)
Basophils Relative: 0.5 %
Eosinophils Absolute: 237 cells/uL (ref 15–500)
Eosinophils Relative: 3.7 %
HCT: 36.1 % (ref 35.0–45.0)
Hemoglobin: 12.7 g/dL (ref 11.7–15.5)
Lymphs Abs: 2694 cells/uL (ref 850–3900)
MCH: 31.3 pg (ref 27.0–33.0)
MCHC: 35.2 g/dL (ref 32.0–36.0)
MCV: 88.9 fL (ref 80.0–100.0)
MPV: 8.5 fL (ref 7.5–12.5)
Monocytes Relative: 7.3 %
NEUTROS PCT: 46.4 %
Neutro Abs: 2970 cells/uL (ref 1500–7800)
Platelets: 329 10*3/uL (ref 140–400)
RBC: 4.06 10*6/uL (ref 3.80–5.10)
RDW: 12.5 % (ref 11.0–15.0)
Total Lymphocyte: 42.1 %
WBC: 6.4 10*3/uL (ref 3.8–10.8)

## 2018-10-20 LAB — COMPLETE METABOLIC PANEL WITH GFR
AG Ratio: 1.4 (calc) (ref 1.0–2.5)
ALT: 12 U/L (ref 6–29)
AST: 17 U/L (ref 10–35)
Albumin: 4.1 g/dL (ref 3.6–5.1)
Alkaline phosphatase (APISO): 67 U/L (ref 33–130)
BUN: 15 mg/dL (ref 7–25)
CALCIUM: 9.6 mg/dL (ref 8.6–10.4)
CO2: 24 mmol/L (ref 20–32)
Chloride: 105 mmol/L (ref 98–110)
Creat: 0.84 mg/dL (ref 0.60–0.88)
GFR, EST NON AFRICAN AMERICAN: 63 mL/min/{1.73_m2} (ref >=60)
GFR, Est African American: 73 mL/min/{1.73_m2} (ref >=60)
Globulin: 2.9 g/dL (calc) (ref 1.9–3.7)
Glucose, Bld: 96 mg/dL (ref 65–99)
Potassium: 4.1 mmol/L (ref 3.5–5.3)
Sodium: 138 mmol/L (ref 135–146)
Total Bilirubin: 0.4 mg/dL (ref 0.2–1.2)
Total Protein: 7 g/dL (ref 6.1–8.1)

## 2018-10-20 LAB — RPR: RPR Ser Ql: NONREACTIVE

## 2018-10-20 LAB — LIPID PANEL
Cholesterol: 197 mg/dL (ref <200)
HDL: 62 mg/dL (ref >50)
LDL Cholesterol (Calc): 119 mg/dL (calc) — ABNORMAL HIGH
Non-HDL Cholesterol (Calc): 135 mg/dL (calc) — ABNORMAL HIGH (ref <130)
Total CHOL/HDL Ratio: 3.2 (calc) (ref <5.0)
Triglycerides: 70 mg/dL (ref <150)

## 2018-10-20 LAB — B12 AND FOLATE PANEL
Folate: 9.6 ng/mL
Vitamin B-12: 432 pg/mL (ref 200–1100)

## 2018-10-20 LAB — TSH: TSH: 0.9 mIU/L (ref 0.40–4.50)

## 2018-10-28 ENCOUNTER — Ambulatory Visit
Admission: RE | Admit: 2018-10-28 | Discharge: 2018-10-28 | Disposition: A | Discharge status: Home / Self Care | Payer: PPO | Source: Ambulatory Visit | Attending: Nurse Practitioner | Admitting: Nurse Practitioner

## 2018-10-28 DIAGNOSIS — R413 Other amnesia: Secondary | ICD-10-CM | POA: Diagnosis not present

## 2018-10-30 ENCOUNTER — Encounter: Payer: Self-pay | Admitting: Nurse Practitioner

## 2018-10-30 ENCOUNTER — Telehealth: Payer: Self-pay

## 2018-10-30 DIAGNOSIS — M810 Age-related osteoporosis without current pathological fracture: Secondary | ICD-10-CM | POA: Insufficient documentation

## 2018-10-30 NOTE — Telephone Encounter (Signed)
I was in process to completing Prolia Verification. I called patient to inquire about previous medications tried for Osteoporosis. Patient states she did not agree to prolia and there was no need to further process verification.  I reviewed last OV note and reminded patient that she agreed to Prolia on 10/17/18. Patient denied agreement. I asked patient if it was ok if I contact her granddaughter to confirm refusal of medication, patient responded " You can call whomever you would like, but I make the final decision about what's done to my body and I am not getting no injections."   I called patient's granddaughter Burna Mortimer, mailbox was full and I was unable to leave a message.

## 2018-11-05 ENCOUNTER — Telehealth: Payer: Self-pay

## 2018-11-05 NOTE — Telephone Encounter (Signed)
Per Burna Mortimer (granddaughter) ok to further discuss with patient. Spoke with patient, patient verbalized understanding of Jessica's response and states compliance would not be an issue due to memory loss for she has no issues with memory. Patient proceeded to say she lives alone and manages things on her own. Patient stated at this time she will continue with Vit D. Patient is not interested in any other therapy for osteoporosis at this time.    I confirmed that patient is taking Vit D 50,000 units as listed on medication, patient states she is taking OTC vit D3 2000 units daily. Patient not certain how long ago she stopped Vit D 50,000. Patient thinks it's been at least 6 months or longer. Patient would like to know if Janett Billow recommends that she restart prescribed dose of Vit D and if so to send rx to [facility] on [address]   Patient requested return call with decision  Please advise

## 2018-11-05 NOTE — Telephone Encounter (Signed)
I called patient's granddaughter again, mailbox was full, and unable to accept message at this time. I will make a final attempt to contact granddaughter later

## 2018-11-05 NOTE — Telephone Encounter (Signed)
Dr Eulas Post had not recommended an oral medication due to GERD and dysphagia (trouble swallowing) -- this could make it worse also compliance would be an issue due to her memory loss. Prolia would be every 6 months in office and we would know she was getting medication.

## 2018-11-05 NOTE — Telephone Encounter (Signed)
Grand daughter, Varney Biles called back. Informed her of Chrae's message and she stated that patient did not want to take the Prolia. [place] daughter stated that she will speak with patient to see if she is willing to do any treatment for Osteoporosis and then she will give [place] a call back.

## 2018-11-05 NOTE — Telephone Encounter (Signed)
Patient's granddaughter returned call to inform Alexandria Beck that she spoke with Alexandria Beck and she is refusing the Prolia Injection as recommended for osteoporosis treatment, yet she is willing to try an oral medication.  Please advise

## 2018-11-05 NOTE — Telephone Encounter (Signed)
It is fine to take VIt D 2000 units daily to add calcium if she is not already taking

## 2018-11-06 NOTE — Telephone Encounter (Signed)
Left detailed message for Alexandria Beck with Jessica's response.

## 2018-11-29 ENCOUNTER — Emergency Department (HOSPITAL_COMMUNITY)
Admission: EM | Admit: 2018-11-29 | Discharge: 2018-11-29 | Disposition: A | Discharge status: Home / Self Care | Payer: PPO | Attending: Emergency Medicine | Admitting: Emergency Medicine

## 2018-11-29 ENCOUNTER — Other Ambulatory Visit: Payer: Self-pay

## 2018-11-29 DIAGNOSIS — R52 Pain, unspecified: Secondary | ICD-10-CM | POA: Diagnosis present

## 2018-11-29 DIAGNOSIS — M1990 Unspecified osteoarthritis, unspecified site: Secondary | ICD-10-CM | POA: Diagnosis not present

## 2018-11-29 DIAGNOSIS — F419 Anxiety disorder, unspecified: Secondary | ICD-10-CM | POA: Diagnosis not present

## 2018-11-29 DIAGNOSIS — Z79899 Other long term (current) drug therapy: Secondary | ICD-10-CM | POA: Insufficient documentation

## 2018-11-29 DIAGNOSIS — I10 Essential (primary) hypertension: Secondary | ICD-10-CM | POA: Diagnosis not present

## 2018-11-29 NOTE — Discharge Instructions (Addendum)
We have reviewed your testing which your echo provider ordered in December, after the office visit.  All of those tests were normal.  Your symptoms today are consistent with nonspecific arthritis, and anxiety.  The treatment for arthritis for you would be Tylenol 650 mg every 4 hours as needed for pain.  Times using heat and sore areas can be helpful as well.  For anxiety it is important to continue taking your prescribed medicines, and follow-up with your doctor, or the doctor of your choice for further care and treatment.  Anxiety is best treated by a primary care doctor in the outpatient setting.  Good luck in finding the best treatment for your symptoms.  You can always return here if needed for emergency problems.

## 2018-11-29 NOTE — ED Provider Notes (Signed)
[facility] EMERGENCY DEPARTMENT Provider Note   CSN: [id number] Arrival date & time: 11/29/18  2505     History   Chief Complaint Chief Complaint  Patient presents with  . Generalized Burning and Tingling    HPI Alexandria Beck is a 85 y.o. female.  HPI   Patient presents for ongoing tingling and aching in her whole body.  She attributes this discomfort to "anxiety."  She also is concerned about arthritis which affects multiple parts of her body for which she is currently taking ibuprofen.  She follows closely with her doctor, and is taking medicines as prescribed.  She has recently become uncomfortable with her current medical provider, because her former doctor left the practice.  She is actively seeking another primary care provider.  She denies change in health status, otherwise.  She denies chest pain, shortness of breath, focal weakness or paresthesia.  She is here with her daughter.  Her daughter contributed to the discussion.  There are no other known modifying factors.  Past Medical History:  Diagnosis Date  . Allergy    seasonal  . Diverticulosis 2004  . Esophageal stricture   . GERD (gastroesophageal reflux disease)    has had EGD 2006-negative  . Helicobacter positive gastritis 2006  . Hypertension     On no medication. Home readings SBP=150's  . Internal hemorrhoids   . Irregular heart beat    no testing, holter, etc. Takes no medication  . Near syncope    diagnosed as anxiety related. gets some relief with clonazepam. No formal testing done.    Patient Active Problem List   Diagnosis Date Noted  . Osteoporosis 10/30/2018  . Left lumbar radiculopathy 06/05/2016  . Generalized anxiety disorder 04/18/2016  . Neck pain 04/18/2016  . Tinnitus 04/18/2016  . Varicose veins of both lower extremities 03/11/2015  . Pain in the chest   . Chest pain 12/21/2014  . Insomnia 07/08/2014  . PVC's (premature ventricular contractions) 05/20/2014  .  Murmur 05/20/2014  . Dysphagia 11/12/2013  . Routine health maintenance 10/14/2013  . Other and unspecified hyperlipidemia 10/13/2013  . Chronic back pain 10/13/2013  . 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 Paresthesia of lower extremity 01/08/2013  . Paresthesia of hand 06/18/2012  . Urticaria 08/28/2011  . Anxiety 08/19/2011  . Near syncope   . GERD (gastroesophageal reflux disease)   . Hypertension     Past Surgical History:  Procedure Laterality Date  . ABDOMINAL HYSTERECTOMY  1974   fibroid tumors w/ metorrhagia  . Robert.Gallant - NSVD  midwife delivery for two    . TONSILLECTOMY    . TONSILLECTOMY       OB History   No obstetric history on file.      Home Medications    Prior to Admission medications   Medication Sig Start Date End Date Taking? Authorizing Provider  atorvastatin (LIPITOR) 10 MG tablet TAKE 1 TABLET(10 MG) BY MOUTH AT BEDTIME 10/17/18   Lauree Chandler, NP  cetirizine (ZYRTEC) 5 MG tablet Take 1 tablet (5 mg total) by mouth daily. allergies 11/22/17   Gildardo Cranker, DO  Cholecalciferol (VITAMIN D3) 50 MCG (2000 UT) capsule Take 2,000 Units by mouth daily.    [provider]  DULoxetine (CYMBALTA) 30 MG capsule Take 1 capsule (30 mg total) by mouth daily. For anxiety 10/17/18   Lauree Chandler, NP  neomycin-polymyxin-hydrocortisone (CORTISPORIN) OTIC solution Place 3 drops into the left  ear 4 (four) times daily.    [provider]  OVER THE COUNTER MEDICATION Apply 1 application topically daily. "Two Old Goats - Moisturizing lotion"    [provider]  PATADAY 0.2 % SOLN 1 drop in right eye every morning 07/19/15   [provider]  RESTASIS 0.05 % ophthalmic emulsion Place 1 drop into both eyes 2 (two) times daily.  12/20/14   [provider]  Sodium Bicarbonate-Citric Acid (ALKA-SELTZER HEARTBURN PO) Take by mouth as needed.    [provider]  telmisartan  (MICARDIS) 40 MG tablet TAKE 1 TABLET(40 MG) BY MOUTH DAILY FOR BLOOD PRESSURE 10/17/18   Lauree Chandler, NP  vitamin C (ASCORBIC ACID) 500 MG tablet Take 500 mg by mouth daily.    [provider]    Family History Family History  Problem Relation Age of Onset  . Heart disease Mother   . Stroke Mother   . Hypertension Mother   . Cancer Father        lung cancer - smoker  . Heart disease Sister        MI  . Kidney disease Sister        end stage renal disease on HD  . Diabetes Sister   . Heart disease Brother   . Diabetes Sister   . Hypertension Sister   . Kidney disease Sister   . Cancer Sister   . Diabetes Sister   . Kidney disease Sister        HD  . Hypertension Sister   . Heart disease Sister   . Hypertension Sister   . Heart disease Sister   . Hypertension Sister   . Hypertension Sister     Social History Social History   Tobacco Use  . Smoking status: Never Smoker  . Smokeless tobacco: Never Used  Substance Use Topics  . Alcohol use: No    Alcohol/week: 0.0 standard drinks  . Drug use: No     Allergies   Clonazepam; Codeine; Lexapro [escitalopram oxalate]; and Aspirin   Review of Systems Review of Systems  All other systems reviewed and are negative.    Physical Exam Updated Vital Signs BP (!) 171/95 (BP Location: Right Arm)   Pulse 76   Temp 98.6 F (37 C) (Oral)   Resp 18   Ht 5\' 1"  (1.549 m)   Wt 72.6 kg   SpO2 97%   BMI 30.23 kg/m   Physical Exam Vitals signs and nursing note reviewed.  Constitutional:      General: She is not in acute distress.    Appearance: Normal appearance. She is well-developed. She is not ill-appearing, toxic-appearing or diaphoretic.  HENT:     Head: Normocephalic and atraumatic.     Right Ear: External ear normal.     Left Ear: External ear normal.     Nose: Nose normal. No congestion or rhinorrhea.     Mouth/Throat:     Mouth: Mucous membranes are moist.     Pharynx: No oropharyngeal  exudate or posterior oropharyngeal erythema.  Eyes:     Conjunctiva/sclera: Conjunctivae normal.     Pupils: Pupils are equal, round, and reactive to light.  Neck:     Musculoskeletal: Normal range of motion and neck supple.     Trachea: Phonation normal.  Cardiovascular:     Rate and Rhythm: Normal rate and regular rhythm.     Heart sounds: Normal heart sounds.  Pulmonary:     Effort: Pulmonary effort  is normal.     Breath sounds: Normal breath sounds.  Abdominal:     Palpations: Abdomen is soft.     Tenderness: There is no abdominal tenderness.  Musculoskeletal: Normal range of motion.  Skin:    General: Skin is warm and dry.  Neurological:     Mental Status: She is alert and oriented to person, place, and time.     Cranial Nerves: No cranial nerve deficit.     Sensory: No sensory deficit.     Motor: No abnormal muscle tone.     Coordination: Coordination normal.     Comments: No dysarthria, aphasia or nystagmus.  Psychiatric:        Behavior: Behavior normal.        Thought Content: Thought content normal.        Judgment: Judgment normal.      ED Treatments / Results  Labs (all labs ordered are listed, but only abnormal results are displayed) Labs Reviewed - No data to display  EKG None  Radiology No results found.  Procedures Procedures (including critical care time)  Medications Ordered in ED Medications - No data to display   Initial Impression / Assessment and Plan / ED Course  I have reviewed the triage vital signs and the nursing notes.  Pertinent labs & imaging results that were available during my care of the patient were reviewed by me and considered in my medical decision making (see chart for details).      Patient Vitals for the past 24 hrs:  BP Temp Temp src Pulse Resp SpO2 Height Weight  11/29/18 0847 - - - - - - 5\' 1"  (1.549 m) 72.6 kg  11/29/18 0846 (!) 171/95 98.6 F (37 C) Oral 76 18 97 % - -    9:15 AM Reevaluation with update  and discussion. After initial assessment and treatment, an updated evaluation reveals no change in clinical status.  Findings discussed with patient and her daughter, all questions were answered. Daleen Bo   Medical Decision Making: Nonspecific malaise, with reassuring evaluation currently.  Recent evaluation was also reassuring.  She has had comprehensive evaluation for the same complaints for which she presents today.  Apparently she restarted Cymbalta as recommended by her PCP, in December.  There are no acute conditions requiring further ED observation, evaluation or hospitalization.  CRITICAL CARE-no Performed by: Daleen Bo  Nursing Notes Reviewed/ Care Coordinated Applicable Imaging Reviewed Interpretation of Laboratory Data incorporated into ED treatment  The patient appears reasonably screened and/or stabilized for discharge and I doubt any other medical condition or other [facility] requiring further screening, evaluation, or treatment in the ED at this time prior to discharge.  Plan: Home Medications-change to Tylenol, for arthritis pain.  Continue other current medications.; Home Treatments-rest, fluids; return here if the recommended treatment, does not improve the symptoms; Recommended follow up-ACP of choice PRN   Final Clinical Impressions(s) / ED Diagnoses   Final diagnoses:  Osteoarthritis, unspecified osteoarthritis type, unspecified site  Anxiety    ED Discharge Orders    None       Daleen Bo, MD 11/29/18 [phone]

## 2018-11-29 NOTE — ED Notes (Signed)
Pt given discharge instructions and follow up information. Pt given the opportunity to answer questions. Pt verbalized understanding. Pt discharged from the ED.

## 2018-11-29 NOTE — ED Triage Notes (Signed)
Pt reports that she has had intermittent burning and tingling all over for about 1-2 months. Pt reports that it has gotten increasingly worse over the past few weeks, especially at night when she tries to sleep. Pt reports that she was recently told she had arthritis. Pt denies any numbness or weakness.

## 2018-12-01 ENCOUNTER — Telehealth: Payer: Self-pay | Admitting: *Deleted

## 2018-12-01 NOTE — Telephone Encounter (Signed)
Received message from [place]: "Lets have her follow up with [place]"  Patient was in ED with Generalized Burning and Tingling due to discomfort to "anxiety"  Called and spoke with patient and offered an appointment to follow up. Patient stated that she will have to coordinate a time with her grand daughter to come in. Stated that she will call us back to schedule an appointment.

## 2018-12-08 ENCOUNTER — Ambulatory Visit (INDEPENDENT_AMBULATORY_CARE_PROVIDER_SITE_OTHER): Payer: PPO | Admitting: Family

## 2018-12-08 ENCOUNTER — Encounter: Payer: Self-pay | Admitting: Family

## 2018-12-08 VITALS — BP 130/80 | HR 83 | Temp 98.2°F | Ht 61.0 in | Wt 167.0 lb

## 2018-12-08 DIAGNOSIS — M1990 Unspecified osteoarthritis, unspecified site: Secondary | ICD-10-CM | POA: Diagnosis not present

## 2018-12-08 DIAGNOSIS — F411 Generalized anxiety disorder: Secondary | ICD-10-CM | POA: Diagnosis not present

## 2018-12-08 MED ORDER — MENTHOL (TOPICAL ANALGESIC) 4 % EX GEL: 3.0000 [oz_av] | Freq: Three times a day (TID) | CUTANEOUS | 3 refills | Status: DC | PRN

## 2018-12-08 MED ORDER — DULOXETINE HCL 40 MG PO CPEP: 30.0000 mg | ORAL_CAPSULE | Freq: Every day | ORAL | 0 refills | Status: DC

## 2018-12-08 NOTE — Progress Notes (Signed)
Provider: Dinah Ngetich FNP-C  Lauree Chandler, NP  Patient Care Team: Lauree Chandler, NP as PCP - General (Geriatric Medicine) Hendricks Limes, MD as Consulting Physician (Internal Medicine) Shirley Muscat Loreen Freud, MD as Referring Physician (Optometry)  Extended Emergency Contact Information Primary Emergency Contact: Moreen [place] of [place] Phone: [phone] Relation: Granddaughter Secondary Emergency Contact: [place] Phone: [phone] Mobile Phone: [phone] Relation: Daughter   Goals of care: Advanced Directive information Advanced Directives 12/08/2018  Does Patient Have a Medical Advance Directive? No  Type of Advance Directive -  Does patient want to make changes to medical advance directive? -  Copy of [place] in Chart? -  Would patient like information on creating a medical advance directive? No - Patient declined     Chief Complaint  Patient presents with  . Acute Visit    Patient would like to talk to someone about lab and xray results concerning arthriitis. Patient also c/o dry mouth and sounding horse and not sleeping at night.    . Medication Management    Patient states current dose of Cymbalta is not working    HPI:  Pt is a 85 y.o. female seen today for an acute visit for follow up ED visit.she states went to ED 11/29/2018 due to burning/tingling due to anxiety.Her burning and tingling was thought due to her Arthritis.she states lives by herself and gets anxious.she was started on Cymbalta 30 mg capsule daily 10/17/2018 by [name] NP but states Cymbalta does not do anything for her.Also states her daughter gave her half of her Xanax in the past which seems to have worked better.wonders whether she can get the Xanax instead of Cymbalta or adjust it.Discussed with patient high risk for falls with use of Xanax.verbalized understanding.She states does  puzzles,has a friend and neighbors who she talks to daily.Also goes to church and read her Bible daily.she also walking in the neighborhood.  She would also like to discussed results of CT scan done worries that there was something wrong in the blood vessel in her head. CT scan showed age related volume loss with slight periventricular small vessel disease.No acute infarct.No mass or hemorrhage.calcification of foci of carotid artery was noted.Also mucosal thickening in several ethmoid air cells.st states currently taking baby Asprin.Low carbohydrates,low saturated fats and high vegetable diet.    Past Medical History:  Diagnosis Date  . Allergy    seasonal  . Diverticulosis 2004  . Esophageal stricture   . GERD (gastroesophageal reflux disease)    has had EGD 2006-negative  . Helicobacter positive gastritis 2006  . Hypertension     On no medication. Home readings SBP=150's  . Internal hemorrhoids   . Irregular heart beat    no testing, holter, etc. Takes no medication  . Near syncope    diagnosed as anxiety related. gets some relief with clonazepam. No formal testing done.   Past Surgical History:  Procedure Laterality Date  . ABDOMINAL HYSTERECTOMY  1974   fibroid tumors w/ metorrhagia  . Robert.Gallant - NSVD  midwife delivery for two    . TONSILLECTOMY    . TONSILLECTOMY      Allergies  Allergen Reactions  . Clonazepam Swelling and Other (See Comments)    Tongue thickening sensation  . Codeine Rash  . Lexapro [Escitalopram Oxalate] Other (See Comments)    Made her "feel funny"  . Aspirin Nausea And Vomiting and Other (See Comments)  Stomach upset    Outpatient Encounter Medications as of 12/08/2018  Medication Sig  . atorvastatin (LIPITOR) 10 MG tablet TAKE 1 TABLET(10 MG) BY MOUTH AT BEDTIME  . cetirizine (ZYRTEC) 5 MG tablet Take 1 tablet (5 mg total) by mouth daily. allergies  . Cholecalciferol (VITAMIN D3) 50 MCG (2000 UT) capsule Take 2,000 Units by mouth daily.  .  DULoxetine (CYMBALTA) 30 MG capsule Take 1 capsule (30 mg total) by mouth daily. For anxiety  . OVER THE COUNTER MEDICATION Apply 1 application topically daily. "Two Old Goats - Moisturizing lotion"  . PATADAY 0.2 % SOLN 1 drop in right eye every morning  . RESTASIS 0.05 % ophthalmic emulsion Place 1 drop into both eyes 2 (two) times daily.   . Sodium Bicarbonate-Citric Acid (ALKA-SELTZER HEARTBURN PO) Take by mouth as needed.  [name] telmisartan (MICARDIS) 40 MG tablet TAKE 1 TABLET(40 MG) BY MOUTH DAILY FOR BLOOD PRESSURE  . vitamin C (ASCORBIC ACID) 500 MG tablet Take 500 mg by mouth daily.  . [DISCONTINUED] neomycin-polymyxin-hydrocortisone (CORTISPORIN) OTIC solution Place 3 drops into the left ear 4 (four) times daily.  . [DISCONTINUED] vitamin C (ASCORBIC ACID) 500 MG tablet Take 500 mg by mouth daily.   No facility-administered encounter medications on file as of 12/08/2018.     Review of Systems  Constitutional: Negative for appetite change, chills, fatigue and fever.  HENT: Negative for congestion, rhinorrhea, sinus pressure, sinus pain, sneezing, sore throat and trouble swallowing.   Eyes: Positive for visual disturbance. Negative for discharge, redness and itching.       Wears eye glasses   Respiratory: Negative for cough, chest tightness, shortness of breath and wheezing.   Cardiovascular: Negative for chest pain, palpitations and leg swelling.  Gastrointestinal: Negative for abdominal distention, abdominal pain, constipation, diarrhea, nausea and vomiting.  Endocrine: Negative for cold intolerance and heat intolerance.  Genitourinary: Negative for dysuria, flank pain, frequency and urgency.  Musculoskeletal: Positive for arthralgias. Negative for gait problem.       Arthritis upper back,shoulders and knees states Tylenol was recommended in the ED.   Skin: Negative for color change, pallor and rash.  Neurological: Negative for dizziness, weakness, light-headedness and headaches.        Occasional generalized numbness and tingling due to anxiety.    Psychiatric/Behavioral: Negative for agitation, confusion and sleep disturbance.    Immunization History  Administered Date(s) Administered  . Influenza Split 07/30/2011  . Influenza,inj,Quad PF,6+ Mos 07/13/2014, 06/20/2016, 07/03/2017  . Influenza-Unspecified 07/29/2018  . Pneumococcal Conjugate-13 09/23/2013  . Pneumococcal Polysaccharide-23 09/12/2016  . Zoster 09/29/2015   Pertinent  Health Maintenance Due  Topic Date Due  . FOOT EXAM  06/23/1941  . HEMOGLOBIN A1C  10/09/2014  . OPHTHALMOLOGY EXAM  03/29/2018  . INFLUENZA VACCINE  Completed  . DEXA SCAN  Completed  . PNA vac Low Risk Adult  Completed   Fall Risk  12/08/2018 10/17/2018 10/03/2018 12/20/2017 11/22/2017  Falls in the past year? 0 0 0 Yes No  Number falls in past yr: 0 0 0 1 -  Injury with Fall? 0 0 0 No -    Vitals:   12/08/18 1417  BP: 130/80  Pulse: 83  Temp: 98.2 F (36.8 C)  TempSrc: Oral  SpO2: 98%  Weight: 167 lb (75.8 kg)  Height: 5\' 1"  (1.549 m)   Body mass index is 31.55 kg/m. Physical Exam Vitals signs reviewed.  Constitutional:      General: She is not in acute distress.  Appearance: She is obese.  HENT:     Head: Normocephalic.     Right Ear: Tympanic membrane, ear canal and external ear normal. There is no impacted cerumen.     Left Ear: Tympanic membrane, ear canal and external ear normal. There is no impacted cerumen.     Nose: Nose normal. No congestion or rhinorrhea.     Mouth/Throat:     Mouth: Mucous membranes are moist.     Pharynx: Oropharynx is clear. No oropharyngeal exudate or posterior oropharyngeal erythema.  Eyes:     General: No scleral icterus.       Right eye: No discharge.        Left eye: No discharge.     Conjunctiva/sclera: Conjunctivae normal.     Pupils: Pupils are equal, round, and reactive to light.     Comments: Corrective lens in place   Neck:     Musculoskeletal: Normal range of  motion. No neck rigidity or muscular tenderness.     Vascular: No carotid bruit.  Cardiovascular:     Rate and Rhythm: Normal rate and regular rhythm.     Pulses: Normal pulses.     Heart sounds: Murmur present. No friction rub. No gallop.   Pulmonary:     Effort: Pulmonary effort is normal. No respiratory distress.     Breath sounds: Normal breath sounds. No wheezing, rhonchi or rales.  Chest:     Chest wall: No tenderness.  Abdominal:     General: Bowel sounds are normal. There is no distension.     Palpations: Abdomen is soft. There is no mass.     Tenderness: There is no abdominal tenderness. There is no right CVA tenderness, left CVA tenderness, guarding or rebound.  Musculoskeletal: Normal range of motion.        General: No swelling or tenderness.     Right lower leg: No edema.     Left lower leg: No edema.  Lymphadenopathy:     Cervical: No cervical adenopathy.  Skin:    General: Skin is warm and dry.     Coloration: Skin is not pale.     Findings: No erythema, lesion or rash.  Neurological:     Mental Status: She is alert and oriented to person, place, and time.     Cranial Nerves: No cranial nerve deficit.     Sensory: No sensory deficit.     Motor: No weakness.     Coordination: Coordination normal.     Gait: Gait normal.  Psychiatric:        Mood and Affect: Mood normal.        Speech: Speech normal.        Behavior: Behavior normal.        Thought Content: Thought content normal.        Judgment: Judgment normal.    Labs reviewed: Recent Labs    10/17/18 0920  NA 138  K 4.1  CL 105  CO2 24  GLUCOSE 96  BUN 15  CREATININE 0.84  CALCIUM 9.6   Recent Labs    03/03/18 0846 10/17/18 0920  AST  --  17  ALT 11 12  BILITOT  --  0.4  PROT  --  7.0   Recent Labs    10/17/18 0920  WBC 6.4  NEUTROABS 2,970  HGB 12.7  HCT 36.1  MCV 88.9  PLT 329   Lab Results  Component Value Date   TSH 0.90 10/17/2018   Lab  Results  Component Value Date    HGBA1C 6.1 04/09/2014   Lab Results  Component Value Date   CHOL 197 10/17/2018   HDL 62 10/17/2018   LDLCALC 119 (H) 10/17/2018   LDLDIRECT 154.5 10/13/2013   TRIG 70 10/17/2018   CHOLHDL 3.2 10/17/2018    Significant Diagnostic Results in last 30 days:  No results found.  Assessment/Plan 1. Generalized anxiety disorder Mood stable during visit.Advance age and use of Xanax discussed verbalized understanding.encouraged to continues with reading her bible,interacting with friends,family support and exercise.Will increase Cymbalta to 40 mg capsule. - DULoxetine 40 MG CPEP; Take 30 mg by mouth daily. For anxiety  Dispense: 90 capsule; Refill: 0 - Notify provider if symptoms worsen.    2. Generalized arthritis -continue on Extra strength Tylenol 500 mg tablet one by mouth every 6 hours as needed. -Add Biofreeze 4% gel apply to affected areas three times as needed for pain.    Family/ staff Communication: Reviewed plan of care with patient.  Labs/tests ordered: None   Follow up: As needed if symptoms worsen.   Sandrea Hughs, NP

## 2018-12-12 ENCOUNTER — Telehealth: Payer: Self-pay | Admitting: *Deleted

## 2018-12-12 DIAGNOSIS — F411 Generalized anxiety disorder: Secondary | ICD-10-CM

## 2018-12-12 MED ORDER — DULOXETINE HCL 40 MG PO CPEP: 40.0000 mg | ORAL_CAPSULE | Freq: Every day | ORAL | 0 refills | Status: DC

## 2018-12-12 NOTE — Telephone Encounter (Signed)
[facility] called clarifying Rx written for patient for Duloxetine. Reviewed Dinah's OV note and it was increased to 40mg  daily.

## 2018-12-17 ENCOUNTER — Other Ambulatory Visit: Payer: Self-pay | Admitting: *Deleted

## 2018-12-17 DIAGNOSIS — F411 Generalized anxiety disorder: Secondary | ICD-10-CM

## 2018-12-17 MED ORDER — DULOXETINE HCL 40 MG PO CPEP: 40.0000 mg | ORAL_CAPSULE | Freq: Every day | ORAL | 0 refills | Status: DC

## 2018-12-17 NOTE — Telephone Encounter (Signed)
Patient called and stated that the pharmacy never received Rx. Refaxed.

## 2019-01-16 ENCOUNTER — Other Ambulatory Visit: Payer: Self-pay

## 2019-01-16 ENCOUNTER — Encounter: Payer: Self-pay | Admitting: Family

## 2019-01-16 ENCOUNTER — Ambulatory Visit (INDEPENDENT_AMBULATORY_CARE_PROVIDER_SITE_OTHER): Payer: PPO | Admitting: Family

## 2019-01-16 VITALS — BP 128/80 | HR 83 | Temp 98.2°F | Ht 61.0 in | Wt 169.2 lb

## 2019-01-16 DIAGNOSIS — D1722 Benign lipomatous neoplasm of skin and subcutaneous tissue of left arm: Secondary | ICD-10-CM | POA: Diagnosis not present

## 2019-01-16 DIAGNOSIS — F411 Generalized anxiety disorder: Secondary | ICD-10-CM

## 2019-01-16 DIAGNOSIS — I10 Essential (primary) hypertension: Secondary | ICD-10-CM

## 2019-01-16 DIAGNOSIS — K219 Gastro-esophageal reflux disease without esophagitis: Secondary | ICD-10-CM | POA: Diagnosis not present

## 2019-01-16 DIAGNOSIS — K5901 Slow transit constipation: Secondary | ICD-10-CM | POA: Diagnosis not present

## 2019-01-16 DIAGNOSIS — J309 Allergic rhinitis, unspecified: Secondary | ICD-10-CM | POA: Diagnosis not present

## 2019-01-16 DIAGNOSIS — E785 Hyperlipidemia, unspecified: Secondary | ICD-10-CM | POA: Diagnosis not present

## 2019-01-16 MED ORDER — POLYETHYLENE GLYCOL 3350 17 GM/SCOOP PO POWD: 17.0000 g | Freq: Every day | ORAL | 1 refills | Status: DC

## 2019-01-16 MED ORDER — TRAZODONE HCL 50 MG PO TABS: 25.0000 mg | ORAL_TABLET | Freq: Every evening | ORAL | 3 refills | Status: DC | PRN

## 2019-01-16 MED ORDER — TETANUS-DIPHTH-ACELL PERTUSSIS 5-2-15.5 LF-MCG/0.5 IM SUSP: 0.5000 mL | Freq: Once | INTRAMUSCULAR | 0 refills | Status: AC

## 2019-01-16 NOTE — Progress Notes (Signed)
Provider: Kdyn Vonbehren FNP-C   Lauree Chandler, NP  Patient Care Team: Lauree Chandler, NP as PCP - General (Geriatric Medicine) Hendricks Limes, MD as Consulting Physician (Internal Medicine) Shirley Muscat Loreen Freud, MD as Referring Physician (Optometry)  Extended Emergency Contact Information Primary Emergency Contact: Moreen [place] Phone: [phone] Relation: Granddaughter Secondary Emergency Contact: [place] Phone: [phone] Mobile Phone: [phone] Relation: Daughter  Goals of care: Advanced Directive information Advanced Directives 12/08/2018  Does Patient Have a Medical Advance Directive? No  Type of Advance Directive -  Does patient want to make changes to medical advance directive? -  Copy of [place] in Chart? -  Would patient like information on creating a medical advance directive? No - Patient declined     Chief Complaint  Patient presents with  . Medical Management of Chronic Issues    3 month follow up patient states right side of throat hurting and not sleeping.   . Medication Management    patient states duloxetine is not helping for anxiety and would like to start xanax   . Quality Metric Gaps    Tetanus Vaccine sent to patient's pharmacy, Foot and Eye Exam, A1C    HPI:  Pt is a 85 y.o. female seen today for medical management of chronic diseases.she states her Duloxetine not helping with her anxiety her daughter came over last gave half a tablet of her Xanax which helped her with her anxiety and slept well.she request prescription for Xanax because it's the only medication that works for her.of note patient has clonazepam listed allergies causes swelling of tongue and thickening sensation.   Also states right side of her throat hurts wonders if this could be from her chronic dysphagia.she denies any fever,chills,pain with swallowing or cough.does  take Cetirizine daily for allergies.states her allergies have not worsen more than usual.No runny nose or sneezing.  Insomnia - wakes up in the middle of the night and unable to sleep until 4 Am.associates sleeplessness with her anxiety.  Hypertension - on Telmisartan 40 mg tablet daily.she denies any headache,dizziness,chest pain or shortness of breath.on Statin but not on ASA due to high risk for bleeding.   GERD - states symptoms under control takes Alka-Seltzer as needed.Reports no signs of bleeding.   Hyperlipidemia - on Lipitor 10 mg tablet daily.States eats fish and tries to avoid other high fat foods.  Constipation - uses epsom salt but still have issues with not being able to go daily with hard stool.she drinks plenty of fluid and has started eating breakfast regularly.states has a neighbor who encourages her to eat breakfast.   Immunization -she is due for Tdap vaccine.    Past Medical History:  Diagnosis Date  . Allergy    seasonal  . Diverticulosis 2004  . Esophageal stricture   . GERD (gastroesophageal reflux disease)    has had EGD 2006-negative  . Helicobacter positive gastritis 2006  . Hypertension     On no medication. Home readings SBP=150's  . Internal hemorrhoids   . Irregular heart beat    no testing, holter, etc. Takes no medication  . Near syncope    diagnosed as anxiety related. gets some relief with clonazepam. No formal testing done.   Past Surgical History:  Procedure Laterality Date  . ABDOMINAL HYSTERECTOMY  1974   fibroid tumors w/ metorrhagia  . Robert.[place] - NSVD  midwife delivery for two    .  TONSILLECTOMY    . TONSILLECTOMY      Allergies  Allergen Reactions  . Clonazepam Swelling and Other (See Comments)    Tongue thickening sensation  . Codeine Rash  . Lexapro [Escitalopram Oxalate] Other (See Comments)    Made her "feel funny"  . Aspirin Nausea And Vomiting and Other (See Comments)    Stomach upset    Allergies as of 01/16/2019       Reactions   Clonazepam Swelling, Other (See Comments)   Tongue thickening sensation   Codeine Rash   Lexapro [escitalopram Oxalate] Other (See Comments)   Made her "feel funny"   Aspirin Nausea And Vomiting, Other (See Comments)   Stomach upset      Medication List       Accurate as of January 16, 2019  9:10 AM. Always use your most recent med list.        ALKA-SELTZER HEARTBURN PO Take by mouth as needed.   atorvastatin 10 MG tablet Commonly known as:  LIPITOR TAKE 1 TABLET(10 MG) BY MOUTH AT BEDTIME   cetirizine 5 MG tablet Commonly known as:  ZYRTEC Take 1 tablet (5 mg total) by mouth daily. allergies   DULoxetine HCl 40 MG Cpep Take 40 mg by mouth daily. For anxiety   Menthol (Topical Analgesic) 4 % Gel Commonly known as:  Biofreeze Apply 3 oz topically 3 (three) times daily as needed.   OVER THE COUNTER MEDICATION Apply 1 application topically daily. "Two Old Goats - Moisturizing lotion"   Pataday 0.2 % Soln Generic drug:  Olopatadine HCl 1 drop in right eye every morning   Restasis 0.05 % ophthalmic emulsion Generic drug:  cycloSPORINE Place 1 drop into both eyes 2 (two) times daily.   telmisartan 40 MG tablet Commonly known as:  MICARDIS TAKE 1 TABLET(40 MG) BY MOUTH DAILY FOR BLOOD PRESSURE   traZODone 50 MG tablet Commonly known as:  DESYREL Take 0.5-1 tablets (25-50 mg total) by mouth at bedtime as needed for up to 30 days for sleep.   vitamin C 500 MG tablet Commonly known as:  ASCORBIC ACID Take 500 mg by mouth daily.   Vitamin D3 50 MCG (2000 UT) capsule Take 2,000 Units by mouth daily.       Review of Systems  Constitutional: Negative for appetite change, chills, fatigue and fever.  HENT: Negative for congestion, rhinorrhea, sinus pressure, sinus pain, sneezing and sore throat.   Eyes: Positive for visual disturbance. Negative for discharge, redness and itching.       Wears eye glasses   Respiratory: Negative for cough, chest tightness,  shortness of breath and wheezing.   Cardiovascular: Negative for chest pain, palpitations and leg swelling.  Gastrointestinal: Positive for constipation. Negative for abdominal distention, abdominal pain, diarrhea, nausea and vomiting.  Endocrine: Negative for cold intolerance, heat intolerance, polydipsia, polyphagia and polyuria.  Genitourinary: Negative for difficulty urinating, dysuria, flank pain, frequency and urgency.  Musculoskeletal: Negative for arthralgias and gait problem.  Skin: Negative for color change, pallor and rash.  Neurological: Negative for dizziness, weakness, light-headedness and headaches.  Hematological: Does not bruise/bleed easily.  Psychiatric/Behavioral: Positive for sleep disturbance. Negative for agitation and confusion. The patient is nervous/anxious.     Immunization History  Administered Date(s) Administered  . Influenza Split 07/30/2011  . Influenza,inj,Quad PF,6+ Mos 07/13/2014, 06/20/2016, 07/03/2017  . Influenza-Unspecified 07/29/2018  . Pneumococcal Conjugate-13 09/23/2013  . Pneumococcal Polysaccharide-23 09/12/2016  . Zoster 09/29/2015   Pertinent  Health Maintenance Due  Topic Date  Due  . FOOT EXAM  06/23/1941  . HEMOGLOBIN A1C  10/09/2014  . OPHTHALMOLOGY EXAM  03/29/2018  . INFLUENZA VACCINE  Completed  . DEXA SCAN  Completed  . PNA vac Low Risk Adult  Completed   Fall Risk  01/16/2019 12/08/2018 10/17/2018 10/03/2018 12/20/2017  Falls in the past year? 0 0 0 0 Yes  Number falls in past yr: 0 0 0 0 1  Injury with Fall? 0 0 0 0 No    Vitals:   01/16/19 0814  BP: 128/80  Pulse: 83  Temp: 98.2 F (36.8 C)  TempSrc: Oral  SpO2: 98%  Weight: 169 lb 3.2 oz (76.7 kg)  Height: _0  (1.549 m)   Body mass index is 31.97 kg/m. Physical Exam Constitutional:      General: She is not in acute distress.    Appearance: She is obese. She is not ill-appearing.  HENT:     Head: Normocephalic.     Right Ear: Tympanic membrane, ear canal and  external ear normal. There is no impacted cerumen.     Left Ear: Tympanic membrane, ear canal and external ear normal. There is no impacted cerumen.     Nose: Nose normal. No congestion or rhinorrhea.     Mouth/Throat:     Mouth: Mucous membranes are moist.     Pharynx: Oropharynx is clear. No oropharyngeal exudate or posterior oropharyngeal erythema.  Eyes:     General: No scleral icterus.       Right eye: No discharge.        Left eye: No discharge.     Extraocular Movements: Extraocular movements intact.     Conjunctiva/sclera: Conjunctivae normal.     Pupils: Pupils are equal, round, and reactive to light.  Neck:     Musculoskeletal: Normal range of motion. No neck rigidity or muscular tenderness.     Vascular: No carotid bruit.  Cardiovascular:     Rate and Rhythm: Normal rate and regular rhythm.     Pulses: Normal pulses.     Heart sounds: Murmur present. No friction rub. No gallop.   Pulmonary:     Effort: Pulmonary effort is normal. No respiratory distress.     Breath sounds: Normal breath sounds. No wheezing, rhonchi or rales.  Chest:     Chest wall: No tenderness.  Abdominal:     General: Bowel sounds are normal. There is no distension.     Palpations: Abdomen is soft. There is no mass.     Tenderness: There is no abdominal tenderness. There is no right CVA tenderness, left CVA tenderness, guarding or rebound.  Musculoskeletal: Normal range of motion.        General: No tenderness.       Arms:     Right lower leg: No edema.     Left lower leg: No edema.     Comments: Left forearm golf ball size soft tissue chronic swelling.Non-tender to touch and no signs of infections.   Lymphadenopathy:     Cervical: No cervical adenopathy.  Skin:    General: Skin is warm and dry.     Findings: No erythema or rash.  Neurological:     Mental Status: She is alert and oriented to person, place, and time.     Cranial Nerves: No cranial nerve deficit.     Sensory: No sensory  deficit.     Motor: No weakness.     Coordination: Coordination normal.  Psychiatric:  Mood and Affect: Mood normal.        Speech: Speech normal.        Behavior: Behavior normal.        Thought Content: Thought content normal.        Judgment: Judgment normal.    Labs reviewed: Recent Labs    10/17/18 0920  NA 138  K 4.1  CL 105  CO2 24  GLUCOSE 96  BUN 15  CREATININE 0.84  CALCIUM 9.6   Recent Labs    03/03/18 0846 10/17/18 0920  AST  --  17  ALT 11 12  BILITOT  --  0.4  PROT  --  7.0   Recent Labs    10/17/18 0920  WBC 6.4  NEUTROABS 2,970  HGB 12.7  HCT 36.1  MCV 88.9  PLT 329   Lab Results  Component Value Date   TSH 0.90 10/17/2018   Lab Results  Component Value Date   HGBA1C 6.1 04/09/2014   Lab Results  Component Value Date   CHOL 197 10/17/2018   HDL 62 10/17/2018   LDLCALC 119 (H) 10/17/2018   LDLDIRECT 154.5 10/13/2013   TRIG 70 10/17/2018   CHOLHDL 3.2 10/17/2018    Significant Diagnostic Results in last 30 days:  No results found.  Assessment/Plan  1. Essential hypertension B/p stable.continue on Telmisartan 40 mg tablet daily - CBC with Differential/Platelet - CMP with eGFR([facility]) - TSH  2. Hyperlipidemia, unspecified hyperlipidemia type Continue on low carbohydrate,low saturated fats and high vegetable diet and exercise at least 30 minutes three times a week as tolerated.  Continue on atorvastatin 10 mg tablet daily.  - Lipid panel  3. Generalized anxiety disorder Request Xanax but has allergies to clonazepam.Also encourgaed to avoid Benzodiazepine due to advance age and high risk for falls.continue on Duloxetine 40 mg tablet daily.     4. Gastroesophageal reflux disease without esophagitis Continue on Alka-seltzer as needed.   5. Slow transit constipation Recommended prune juice daily.Add miralax 17 gm packet mix in fluid once daily as needed. Increase fluid intake.   6. Allergic rhinitis, unspecified  seasonality, unspecified trigger Stable.continue on cetirizine 5 mg tablet daily.   7. Lipoma of left upper extremity Chronic.No change in size.soft non tender to palpation.continue to monitor.    Family/ staff Communication: Reviewed plan of care with patient.  Labs/tests ordered:  - CBC with Differential/Platelet - CMP with eGFR([facility]) - TSH - Lipid panel  Samaj Wessells C Bingham Millette, NP

## 2019-01-17 LAB — CBC WITH DIFFERENTIAL/PLATELET
Absolute Monocytes: 423 cells/uL (ref 200–950)
Basophils Absolute: 29 cells/uL (ref 0–200)
Basophils Relative: 0.5 %
Eosinophils Absolute: 209 cells/uL (ref 15–500)
Eosinophils Relative: 3.6 %
HCT: 35.7 % (ref 35.0–45.0)
Hemoglobin: 12.2 g/dL (ref 11.7–15.5)
Lymphs Abs: 2546 cells/uL (ref 850–3900)
MCH: 30.8 pg (ref 27.0–33.0)
MCHC: 34.2 g/dL (ref 32.0–36.0)
MCV: 90.2 fL (ref 80.0–100.0)
MONOS PCT: 7.3 %
MPV: 8.6 fL (ref 7.5–12.5)
Neutro Abs: 2593 cells/uL (ref 1500–7800)
Neutrophils Relative %: 44.7 %
Platelets: 308 10*3/uL (ref 140–400)
RBC: 3.96 10*6/uL (ref 3.80–5.10)
RDW: 12.4 % (ref 11.0–15.0)
Total Lymphocyte: 43.9 %
WBC: 5.8 10*3/uL (ref 3.8–10.8)

## 2019-01-17 LAB — TSH: TSH: 0.83 m[IU]/L (ref 0.40–4.50)

## 2019-01-17 LAB — COMPLETE METABOLIC PANEL WITH GFR
AG Ratio: 1.6 (calc) (ref 1.0–2.5)
ALKALINE PHOSPHATASE (APISO): 57 U/L (ref 37–153)
ALT: 11 U/L (ref 6–29)
AST: 17 U/L (ref 10–35)
Albumin: 4.1 g/dL (ref 3.6–5.1)
BUN: 15 mg/dL (ref 7–25)
CO2: 26 mmol/L (ref 20–32)
Calcium: 9.5 mg/dL (ref 8.6–10.4)
Chloride: 107 mmol/L (ref 98–110)
Creat: 0.83 mg/dL (ref 0.60–0.88)
GFR, Est African American: 75 mL/min/{1.73_m2} (ref >=60)
GFR, Est Non African American: 64 mL/min/{1.73_m2} (ref >=60)
Globulin: 2.6 g/dL (calc) (ref 1.9–3.7)
Glucose, Bld: 91 mg/dL (ref 65–99)
Potassium: 4.2 mmol/L (ref 3.5–5.3)
Sodium: 143 mmol/L (ref 135–146)
Total Bilirubin: 0.4 mg/dL (ref 0.2–1.2)
Total Protein: 6.7 g/dL (ref 6.1–8.1)

## 2019-01-17 LAB — LIPID PANEL
CHOLESTEROL: 204 mg/dL — AB (ref <200)
HDL: 63 mg/dL (ref >=50)
LDL Cholesterol (Calc): 119 mg/dL (calc) — ABNORMAL HIGH
Non-HDL Cholesterol (Calc): 141 mg/dL (calc) — ABNORMAL HIGH (ref <130)
Total CHOL/HDL Ratio: 3.2 (calc) (ref <5.0)
Triglycerides: 108 mg/dL (ref <150)

## 2019-01-19 ENCOUNTER — Other Ambulatory Visit: Payer: Self-pay

## 2019-01-19 DIAGNOSIS — J309 Allergic rhinitis, unspecified: Secondary | ICD-10-CM

## 2019-01-19 MED ORDER — CETIRIZINE HCL 5 MG PO TABS: 5.0000 mg | ORAL_TABLET | Freq: Every day | ORAL | 1 refills | Status: DC

## 2019-02-22 ENCOUNTER — Encounter: Payer: Self-pay | Admitting: Nurse Practitioner

## 2019-02-26 DIAGNOSIS — H0102A Squamous blepharitis right eye, upper and lower eyelids: Secondary | ICD-10-CM | POA: Diagnosis not present

## 2019-02-26 DIAGNOSIS — H0102B Squamous blepharitis left eye, upper and lower eyelids: Secondary | ICD-10-CM | POA: Diagnosis not present

## 2019-04-15 ENCOUNTER — Other Ambulatory Visit: Payer: Self-pay

## 2019-04-15 ENCOUNTER — Ambulatory Visit (INDEPENDENT_AMBULATORY_CARE_PROVIDER_SITE_OTHER): Payer: PPO | Admitting: Nurse Practitioner

## 2019-04-15 ENCOUNTER — Encounter: Payer: Self-pay | Admitting: Nurse Practitioner

## 2019-04-15 DIAGNOSIS — G8929 Other chronic pain: Secondary | ICD-10-CM

## 2019-04-15 DIAGNOSIS — M810 Age-related osteoporosis without current pathological fracture: Secondary | ICD-10-CM | POA: Diagnosis not present

## 2019-04-15 DIAGNOSIS — K219 Gastro-esophageal reflux disease without esophagitis: Secondary | ICD-10-CM | POA: Diagnosis not present

## 2019-04-15 DIAGNOSIS — F411 Generalized anxiety disorder: Secondary | ICD-10-CM

## 2019-04-15 DIAGNOSIS — I10 Essential (primary) hypertension: Secondary | ICD-10-CM

## 2019-04-15 DIAGNOSIS — M5442 Lumbago with sciatica, left side: Secondary | ICD-10-CM

## 2019-04-15 DIAGNOSIS — G4700 Insomnia, unspecified: Secondary | ICD-10-CM | POA: Diagnosis not present

## 2019-04-15 DIAGNOSIS — E785 Hyperlipidemia, unspecified: Secondary | ICD-10-CM

## 2019-04-15 DIAGNOSIS — K5901 Slow transit constipation: Secondary | ICD-10-CM

## 2019-04-15 MED ORDER — TRAZODONE HCL 50 MG PO TABS: 50.0000 mg | ORAL_TABLET | Freq: Every day | ORAL | 1 refills | Status: DC

## 2019-04-15 NOTE — Patient Instructions (Addendum)
You can continue to take tylenol arthritis for your osteoarthritis.   You have osteoporosis- thinning of the bone- it has been recommended that you get prolia injection- we will check on pricing for this.     DASH Eating Plan DASH stands for "Dietary Approaches to Stop Hypertension." The DASH eating plan is a healthy eating plan that has been shown to reduce high blood pressure (hypertension). It may also reduce your risk for type 2 diabetes, heart disease, and stroke. The DASH eating plan may also help with weight loss. What are tips for following this plan?  General guidelines  Avoid eating more than 2,300 mg (milligrams) of salt (sodium) a day. If you have hypertension, you may need to reduce your sodium intake to 1,500 mg a day.  Limit alcohol intake to no more than 1 drink a day for nonpregnant women and 2 drinks a day for men. One drink equals 12 oz of beer, 5 oz of wine, or 1 oz of hard liquor.  Work with your health care provider to maintain a healthy body weight or to lose weight. Ask what an ideal weight is for you.  Get at least 30 minutes of exercise that causes your heart to beat faster (aerobic exercise) most days of the week. Activities may include walking, swimming, or biking.  Work with your health care provider or diet and nutrition specialist (dietitian) to adjust your eating plan to your individual calorie needs. Reading food labels   Check food labels for the amount of sodium per serving. Choose foods with less than 5 percent of the Daily Value of sodium. Generally, foods with less than 300 mg of sodium per serving fit into this eating plan.  To find whole grains, look for the word "whole" as the first word in the ingredient list. Shopping  Buy products labeled as "low-sodium" or "no salt added."  Buy fresh foods. Avoid canned foods and premade or frozen meals. Cooking  Avoid adding salt when cooking. Use salt-free seasonings or herbs instead of table salt or  sea salt. Check with your health care provider or pharmacist before using salt substitutes.  Do not fry foods. Cook foods using healthy methods such as baking, boiling, grilling, and broiling instead.  Cook with heart-healthy oils, such as olive, canola, soybean, or sunflower oil. Meal planning  Eat a balanced diet that includes: ? 5 or more servings of fruits and vegetables each day. At each meal, try to fill half of your plate with fruits and vegetables. ? Up to 6-8 servings of whole grains each day. ? Less than 6 oz of lean meat, poultry, or fish each day. A 3-oz serving of meat is about the same size as a deck of cards. One egg equals 1 oz. ? 2 servings of low-fat dairy each day. ? A serving of nuts, seeds, or beans 5 times each week. ? Heart-healthy fats. Healthy fats called Omega-3 fatty acids are found in foods such as flaxseeds and coldwater fish, like sardines, salmon, and mackerel.  Limit how much you eat of the following: ? Canned or prepackaged foods. ? Food that is high in trans fat, such as fried foods. ? Food that is high in saturated fat, such as fatty meat. ? Sweets, desserts, sugary drinks, and other foods with added sugar. ? Full-fat dairy products.  Do not salt foods before eating.  Try to eat at least 2 vegetarian meals each week.  Eat more home-cooked food and less restaurant, buffet, and fast  food.  When eating at a restaurant, ask that your food be prepared with less salt or no salt, if possible. What foods are recommended? The items listed may not be a complete list. Talk with your dietitian about what dietary choices are best for you. Grains Whole-grain or whole-wheat bread. Whole-grain or whole-wheat pasta. Brown rice. [place]. Bulgur. Whole-grain and low-sodium cereals. Pita bread. Low-fat, low-sodium crackers. Whole-wheat flour tortillas. Vegetables Fresh or frozen vegetables (raw, steamed, roasted, or grilled). Low-sodium or reduced-sodium  tomato and vegetable juice. Low-sodium or reduced-sodium tomato sauce and tomato paste. Low-sodium or reduced-sodium canned vegetables. Fruits All fresh, dried, or frozen fruit. Canned fruit in natural juice (without added sugar). Meat and other protein foods Skinless chicken or [place]. Ground chicken or [place]. Pork with fat trimmed off. Fish and seafood. Egg whites. Dried beans, peas, or lentils. Unsalted nuts, nut butters, and seeds. Unsalted canned beans. Lean cuts of beef with fat trimmed off. Low-sodium, lean deli meat. Dairy Low-fat (1%) or fat-free (skim) milk. Fat-free, low-fat, or reduced-fat cheeses. Nonfat, low-sodium ricotta or cottage cheese. Low-fat or nonfat yogurt. Low-fat, low-sodium cheese. Fats and oils Soft margarine without trans fats. Vegetable oil. Low-fat, reduced-fat, or light mayonnaise and salad dressings (reduced-sodium). Canola, safflower, olive, soybean, and sunflower oils. Avocado. Seasoning and other foods Herbs. Spices. Seasoning mixes without salt. Unsalted popcorn and pretzels. Fat-free sweets. What foods are not recommended? The items listed may not be a complete list. Talk with your dietitian about what dietary choices are best for you. Grains Baked goods made with fat, such as croissants, muffins, or some breads. Dry pasta or rice meal packs. Vegetables Creamed or fried vegetables. Vegetables in a cheese sauce. Regular canned vegetables (not low-sodium or reduced-sodium). Regular canned tomato sauce and paste (not low-sodium or reduced-sodium). Regular tomato and vegetable juice (not low-sodium or reduced-sodium). Angie Fava. Olives. Fruits Canned fruit in a light or heavy syrup. Fried fruit. Fruit in cream or butter sauce. Meat and other protein foods Fatty cuts of meat. Ribs. Fried meat. Berniece Salines. Sausage. Bologna and other processed lunch meats. Salami. Fatback. Hotdogs. Bratwurst. Salted nuts and seeds. Canned beans with added salt. Canned or smoked fish.  Whole eggs or egg yolks. Chicken or [place] with skin. Dairy Whole or 2% milk, cream, and half-and-half. Whole or full-fat cream cheese. Whole-fat or sweetened yogurt. Full-fat cheese. Nondairy creamers. Whipped toppings. Processed cheese and cheese spreads. Fats and oils Butter. Stick margarine. Lard. Shortening. Ghee. Bacon fat. Tropical oils, such as coconut, palm kernel, or palm oil. Seasoning and other foods Salted popcorn and pretzels. Onion salt, garlic salt, seasoned salt, table salt, and sea salt. Worcestershire sauce. Tartar sauce. Barbecue sauce. Teriyaki sauce. Soy sauce, including reduced-sodium. Steak sauce. Canned and packaged gravies. Fish sauce. Oyster sauce. Cocktail sauce. Horseradish that you find on the shelf. Ketchup. Mustard. Meat flavorings and tenderizers. Bouillon cubes. Hot sauce and Tabasco sauce. Premade or packaged marinades. Premade or packaged taco seasonings. Relishes. Regular salad dressings. Where to find more information:  National Heart, Lung, and [place]: https://wilson-eaton.com/  American Heart Association: www.heart.org Summary  The DASH eating plan is a healthy eating plan that has been shown to reduce high blood pressure (hypertension). It may also reduce your risk for type 2 diabetes, heart disease, and stroke.  With the DASH eating plan, you should limit salt (sodium) intake to 2,300 mg a day. If you have hypertension, you may need to reduce your sodium intake to 1,500 mg a day.  When on the DASH  eating plan, aim to eat more fresh fruits and vegetables, whole grains, lean proteins, low-fat dairy, and heart-healthy fats.  Work with your health care provider or diet and nutrition specialist (dietitian) to adjust your eating plan to your individual calorie needs. This information is not intended to replace advice given to you by your health care provider. Make sure you discuss any questions you have with your health care provider. Document Released:  10/04/2011 Document Revised: 10/08/2016 Document Reviewed: 10/08/2016 Elsevier Interactive Patient Education  2019 [employer].

## 2019-04-15 NOTE — Progress Notes (Signed)
Patient ID: Alexandria Beck, female   DOB: 1931-01-04, 85 y.o.   MRN: [id number]  This service is provided via telemedicine  No vital signs collected/recorded due to the encounter was a telemedicine visit.   Location of patient (ex: home, work):  Home  Patient consents to a telephone visit:  Yes  Location of the provider (ex: office, home):  Office  Name of any referring provider: N/A  Names of all persons participating in the telemedicine service and their role in the encounter:  Patient, Marisa Cyphers RMA, [name] NP  Time spent on call:  10 min      Careteam: Patient Care Team: Lauree Chandler, NP as PCP - General (Geriatric Medicine) Hendricks Limes, MD as Consulting Physician (Internal Medicine) Shirley Muscat Loreen Freud, MD as Referring Physician (Optometry)  Advanced Directive information Does Patient Have a Medical Advance Directive?: No, Would patient like information on creating a medical advance directive?: Yes (ED - Information included in AVS)  Allergies  Allergen Reactions  . Clonazepam Swelling and Other (See Comments)    Tongue thickening sensation  . Codeine Rash  . Lexapro [Escitalopram Oxalate] Other (See Comments)    Made her "feel funny"  . Aspirin Nausea And Vomiting and Other (See Comments)    Stomach upset    Chief Complaint  Patient presents with  . Medical Management of Chronic Issues    3 Month Follow Up, Patient would like to talk to you about Zantac     HPI: Patient is a 85 y.o. female for routine follow up.   Insomnia - wakes up in the middle of the night and unable to sleep until 4 Am.associates sleeplessness with her anxiety.  Hypertension - taking Telmisartan 40 mg tablet daily but has not taken blood pressure at home. Reports blood pressure is "normal" at the drug store.  denies any headache,dizziness,chest pain or shortness of breath.  GERD - controlled on as needed alka-seizer, using occasionally  Hyperlipidemia - LDL  119 on labs in March- Lipitor 10 mg tablet daily. Eats a lot of vegetables and chicken. States she does not eat a lot of fat or fried foods.   Constipation - controlled at this time. Eating yogurt and drinking metamucil   Anxiety/insomnia- bothered by this sometime. Currently out of duloxetine 40 mg by mouth daily - reports it does not do much for her.  Took her granddaughters xanax and she wants a prescription.  Uses trazodone 1 tablet as needed - using every night lately.   Osteoporosis- noted on dexa scan feb 2019. It was recommended for her to have Prolia and vit D. She is taking vit D at this time.   Osteoarthritis-  aches and pains in her back. Took tylenol arthritis occasional.   Review of Systems:  Review of Systems  Constitutional: Negative for chills, fever and weight loss.  HENT: Negative for tinnitus.   Respiratory: Negative for cough, sputum production and shortness of breath.   Cardiovascular: Negative for chest pain, palpitations and leg swelling.  Gastrointestinal: Negative for abdominal pain, constipation, diarrhea, nausea and vomiting.  Genitourinary: Negative for dysuria, frequency and urgency.  Musculoskeletal: Positive for back pain and myalgias. Negative for falls, joint pain and neck pain.  Skin: Negative.   Neurological: Positive for tingling (occasionally from back). Negative for dizziness, weakness and headaches.  Psychiatric/Behavioral: Positive for memory loss. Negative for depression. The patient is nervous/anxious and has insomnia.     Past Medical History:  Diagnosis Date  .  Allergy    seasonal  . Diverticulosis 2004  . Esophageal stricture   . GERD (gastroesophageal reflux disease)    has had EGD 2006-negative  . Helicobacter positive gastritis 2006  . Hypertension     On no medication. Home readings SBP=150's  . Internal hemorrhoids   . Irregular heart beat    no testing, holter, etc. Takes no medication  . Near syncope    diagnosed as  anxiety related. gets some relief with clonazepam. No formal testing done.   Past Surgical History:  Procedure Laterality Date  . ABDOMINAL HYSTERECTOMY  1974   fibroid tumors w/ metorrhagia  . Robert.Gall[place] - NSVD  midwife delivery for two    . TONSILLECTOMY    . TONSILLECTOMY     Social History:   reports that she has never smoked. She has never used smokeless tobacco. She reports that she does not drink alcohol or use drugs.  Family History  Problem Relation Age of Onset  . Heart disease Mother   . Stroke Mother   . Hypertension Mother   . Cancer Father        lung cancer - smoker  . Heart disease Sister        MI  . Kidney disease Sister        end stage renal disease on HD  . Diabetes Sister   . Heart disease Brother   . Diabetes Sister   . Hypertension Sister   . Kidney disease Sister   . Cancer Sister   . Diabetes Sister   . Kidney disease Sister        HD  . Hypertension Sister   . Heart disease Sister   . Hypertension Sister   . Heart disease Sister   . Hypertension Sister   . Hypertension Sister     Medications: Patient's Medications  New Prescriptions   No medications on file  Previous Medications   ATORVASTATIN (LIPITOR) 10 MG TABLET    TAKE 1 TABLET(10 MG) BY MOUTH AT BEDTIME   CETIRIZINE (ZYRTEC) 5 MG TABLET    Take 1 tablet (5 mg total) by mouth daily. allergies   CHOLECALCIFEROL (VITAMIN D3) 50 MCG (2000 UT) CAPSULE    Take 2,000 Units by mouth every other day.    DULOXETINE HCL 40 MG CSDR    Take 40 mg by mouth daily.   MENTHOL, TOPICAL ANALGESIC, (BIOFREEZE) 4 % GEL    Apply 3 oz topically 3 (three) times daily as needed.   OVER THE COUNTER MEDICATION    Apply 1 application topically daily. "Two Old Goats - Moisturizing lotion"   PATADAY 0.2 % SOLN    1 drop in right eye every morning   POLYETHYLENE GLYCOL POWDER (GLYCOLAX/MIRALAX) POWDER    Take 17 g by mouth daily. Hold for loose stool   RESTASIS 0.05 % OPHTHALMIC EMULSION    Place 1 drop into both  eyes 2 (two) times daily.    SODIUM BICARBONATE-CITRIC ACID (ALKA-SELTZER HEARTBURN PO)    Take by mouth as needed.   TELMISARTAN (MICARDIS) 40 MG TABLET    TAKE 1 TABLET(40 MG) BY MOUTH DAILY FOR BLOOD PRESSURE   TRAZODONE (DESYREL) 50 MG TABLET    Take 0.5-1 tablets (25-50 mg total) by mouth at bedtime as needed for up to 30 days for sleep.   TRAZODONE (DESYREL) 50 MG TABLET    Take 25-50 mg by mouth at bedtime.   VITAMIN C (ASCORBIC ACID) 500 MG TABLET  Take 500 mg by mouth daily.  Modified Medications   No medications on file  Discontinued Medications   DULOXETINE HCL 40 MG CPEP    Take 40 mg by mouth daily. For anxiety    Physical Exam:  There were no vitals filed for this visit. There is no height or weight on file to calculate BMI. Wt Readings from Last 3 Encounters:  01/16/19 169 lb 3.2 oz (76.7 kg)  12/08/18 167 lb (75.8 kg)  11/29/18 160 lb (72.6 kg)    Labs reviewed: Basic Metabolic Panel: Recent Labs    10/17/18 0920 01/16/19 0922  NA 138 143  K 4.1 4.2  CL 105 107  CO2 24 26  GLUCOSE 96 91  BUN 15 15  CREATININE 0.84 0.83  CALCIUM 9.6 9.5  TSH 0.90 0.83   Liver Function Tests: Recent Labs    10/17/18 0920 01/16/19 0922  AST 17 17  ALT 12 11  BILITOT 0.4 0.4  PROT 7.0 6.7   No results for input(s): LIPASE, AMYLASE in the last 8760 hours. No results for input(s): AMMONIA in the last 8760 hours. CBC: Recent Labs    10/17/18 0920 01/16/19 0922  WBC 6.4 5.8  NEUTROABS 2,970 2,593  HGB 12.7 12.2  HCT 36.1 35.7  MCV 88.9 90.2  PLT 329 308   Lipid Panel: Recent Labs    10/17/18 0920 01/16/19 0922  CHOL 197 204*  HDL 62 63  LDLCALC 119* 119*  TRIG 70 108  CHOLHDL 3.2 3.2   TSH: Recent Labs    10/17/18 0920 01/16/19 0922  TSH 0.90 0.83   A1C: Lab Results  Component Value Date   HGBA1C 6.1 04/09/2014     Assessment/Plan 1. Essential hypertension Reports controlled on readings when she is out. Educated goal BP <140/90.  Continue current regimen with dietary modifications.   2. Hyperlipidemia, unspecified hyperlipidemia type -continue on Lipitor 10 mg daily  3. Generalized anxiety disorder Stopped cymbalta because she did not feel like it was beneficial. Starting taking trazodone which has been beneficial. Educated her on the use of xanax and due to age should not be taking routinely due to side effects - traZODone (DESYREL) 50 MG tablet; Take 1 tablet (50 mg total) by mouth at bedtime.  Dispense: 90 tablet; Refill: 1  4. Insomnia, unspecified type -has used trazdone nightly with positive effects. To continue this daily at bedtime to help with mood and sleep.  - traZODone (DESYREL) 50 MG tablet; Take 1 tablet (50 mg total) by mouth at bedtime.  Dispense: 90 tablet; Refill: 1  5. Osteoporosis, unspecified osteoporosis type, unspecified pathological fracture presence -discussed prolia again, she is agreeable at this time and we will submit prior authorization   6. Slow transit constipation Controlled with diet and metamucil.   7. Gastroesophageal reflux disease without esophagitis Stable, continues on alka-seltzer  8. Chronic left-sided low back pain with left-sided sciatica Stable, occasional worsening of pain, uses tylenol with good results.   Next appt: 08/17/2019 Carlos American. Harle Battiest  [facility] [phone]   Virtual Visit via Telephone Note  I connected with pt on 04/15/19 at  8:30 AM EDT by telephone and verified that I am speaking with the correct person using two identifiers.  Location: Patient: home Provider: office   I discussed the limitations, risks, security and privacy concerns of performing an evaluation and management service by telephone and the availability of in person appointments. I also discussed with the patient  that there may be a patient responsible charge related to this service. The patient expressed understanding and agreed to  proceed.   I discussed the assessment and treatment plan with the patient. The patient was provided an opportunity to ask questions and all were answered. The patient agreed with the plan and demonstrated an understanding of the instructions.   The patient was advised to call back or seek an in-person evaluation if the symptoms worsen or if the condition fails to improve as anticipated.  I provided 25 minutes of non-face-to-face time during this encounter.  Carlos American. Harle Battiest Avs printed and mailed

## 2019-04-17 ENCOUNTER — Ambulatory Visit: Payer: PPO | Admitting: Family

## 2019-05-27 IMAGING — CT CT HEAD W/O CM
1 series · 15 of 30 positions shown, 19 images · non-contrast
Comparison: Head CT June 06, 2012 and brain MRI June 06, 2012

CLINICAL DATA: Memory loss

EXAM:
CT HEAD WITHOUT CONTRAST
TECHNIQUE: Contiguous axial images were obtained from the base of the skull
through the vertex without intravenous contrast.

[Series 2: head w/(date) · axial · 0.45mm/px · z∈[-103,+47]mm · 15 of 34 slices shown, 19 images]
[im 2/34  brain]
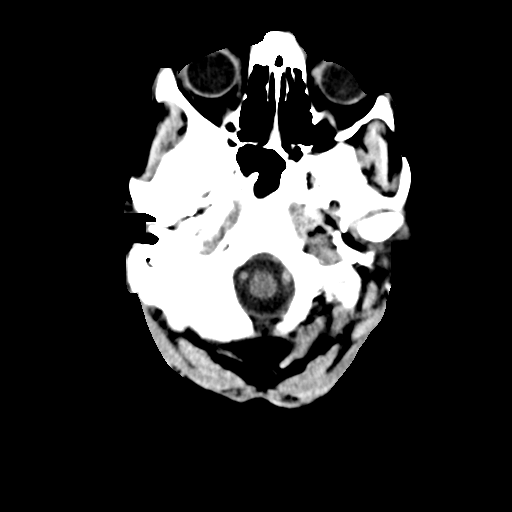
[im 2/34  bone]
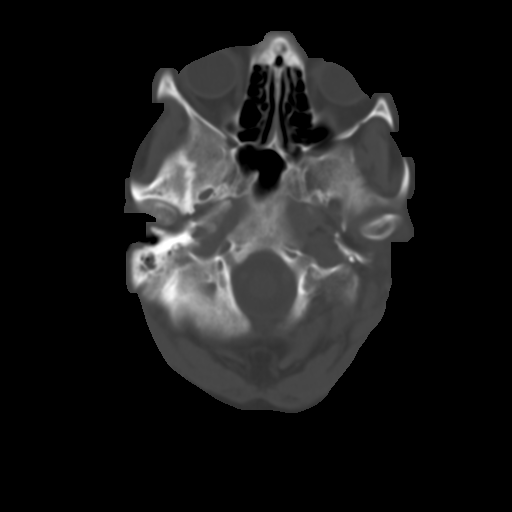
[im 4/34  brain]
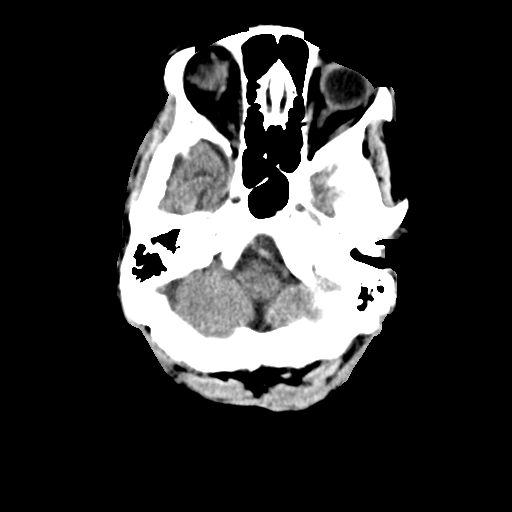
[im 6/34  brain]
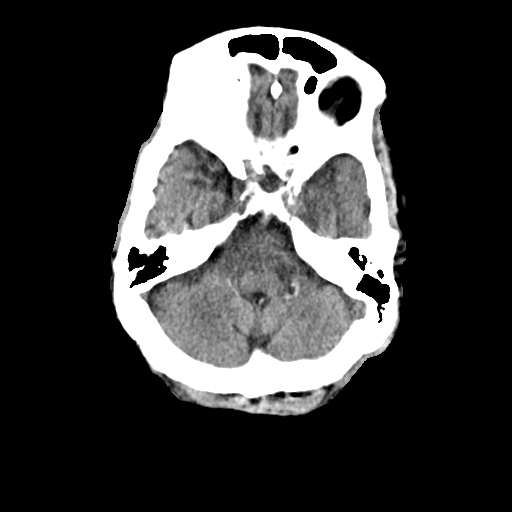
[im 8/34  brain]
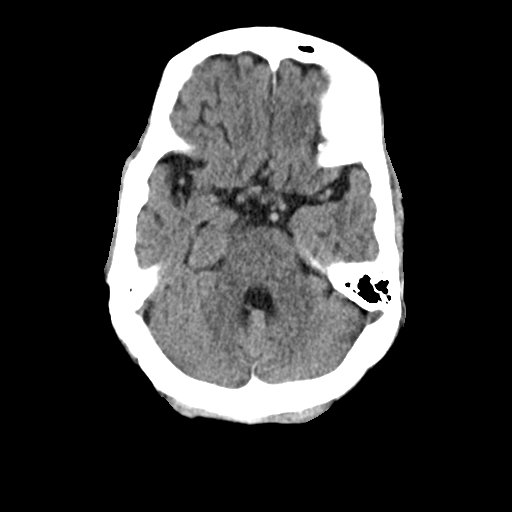
[im 11/34  brain]
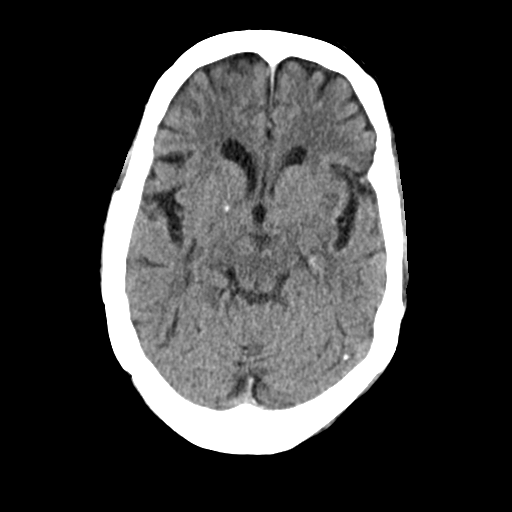
[im 11/34  bone]
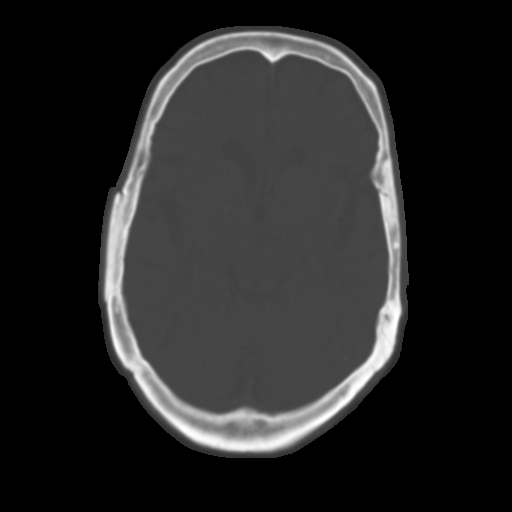
[im 13/34  brain]
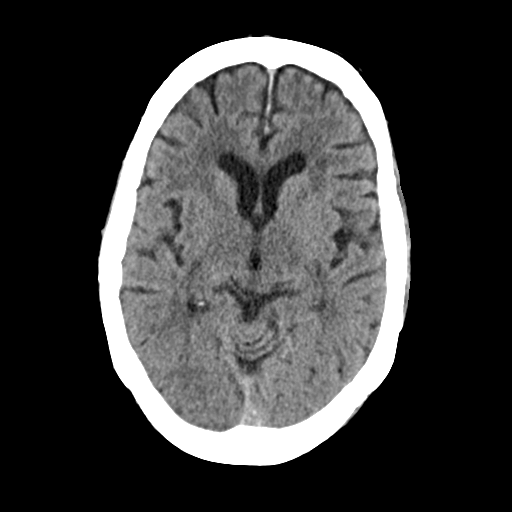
[im 15/34  brain]
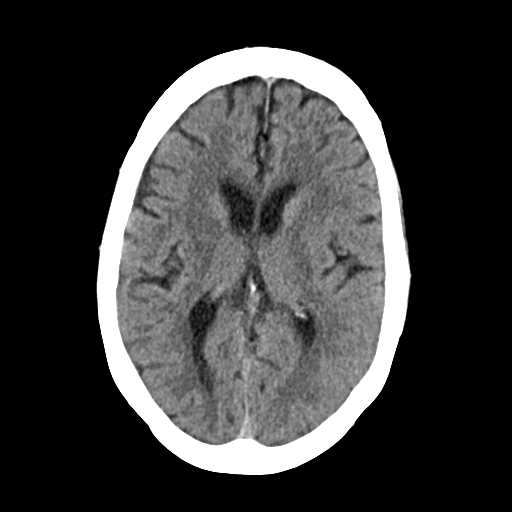
[im 18/34  brain]
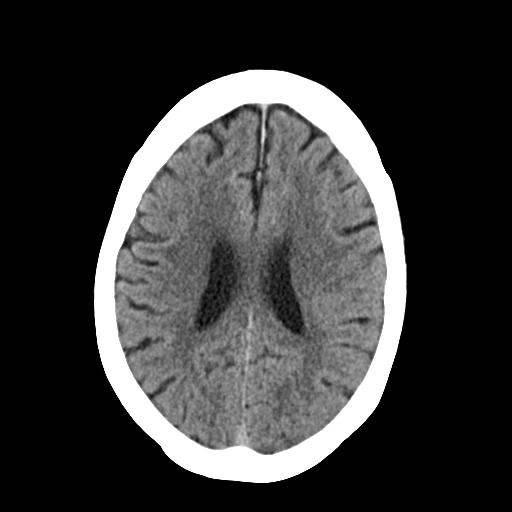
[im 19/34  brain]
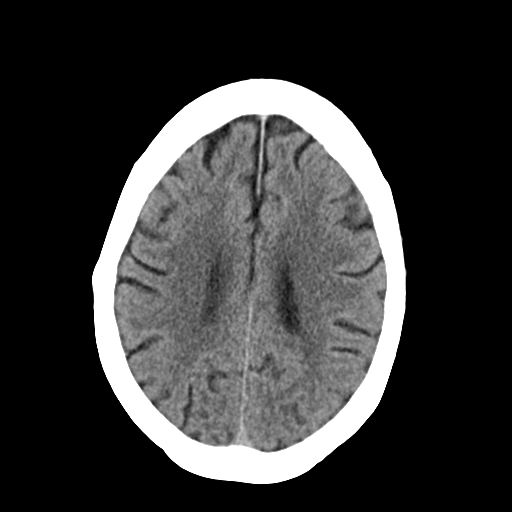
[im 19/34  bone]
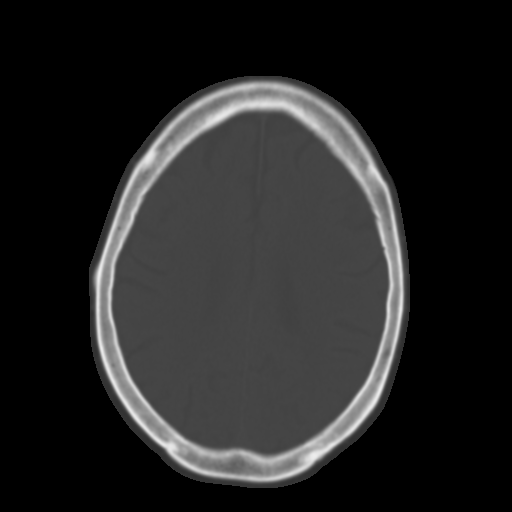
[im 21/34  brain]
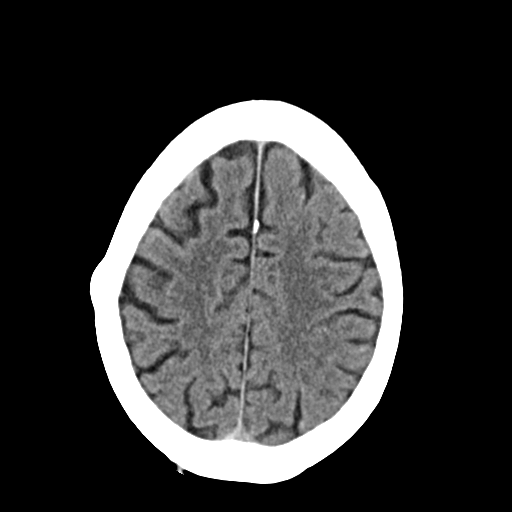
[im 23/34  brain]
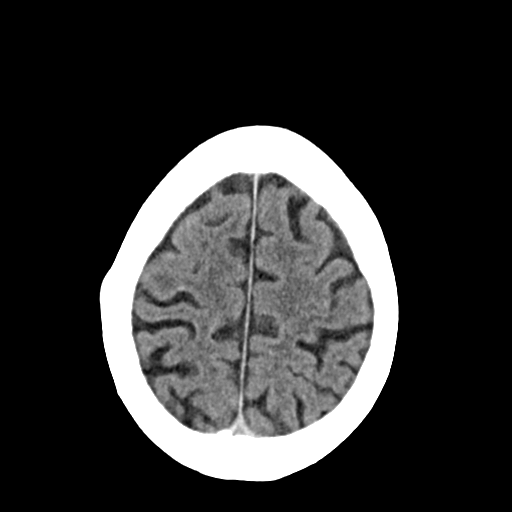
[im 26/34  brain]
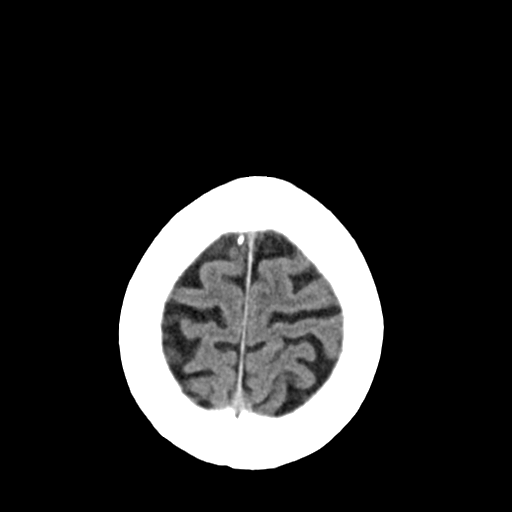
[im 28/34  brain]
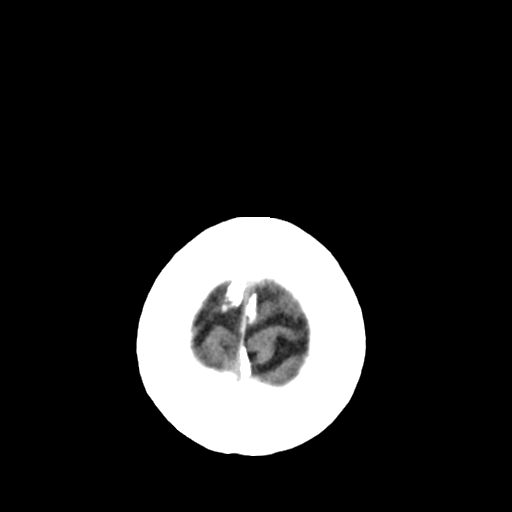
[im 28/34  bone]
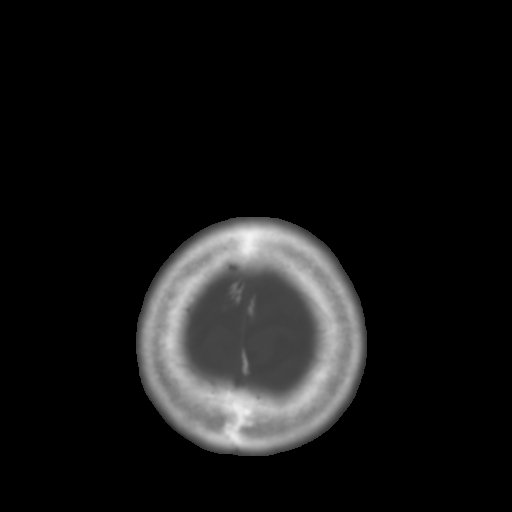
[im 30/34  brain]
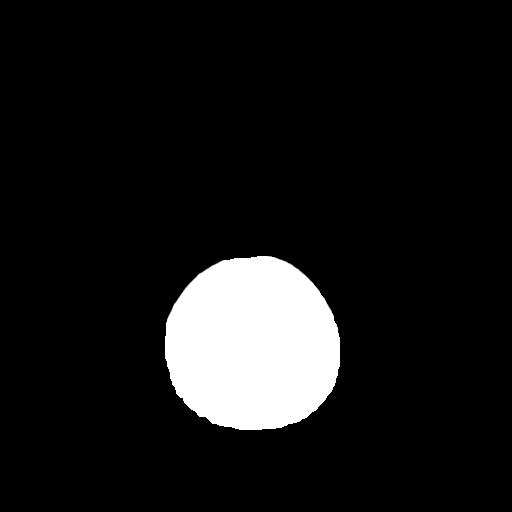
[im 32/34  brain]
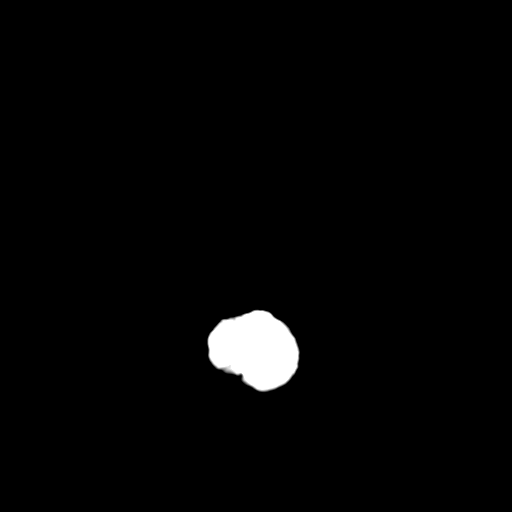

[15 of 30 positions shown; findings below may reference images not displayed]

FINDINGS: Brain: There is age related volume loss. There is no intracranial
mass, hemorrhage, extra-axial fluid collection, or midline shift.
There is slight small vessel disease in the centra semiovale
bilaterally. Brain parenchyma elsewhere appears unremarkable. No
evident acute infarct. Slight basal ganglia calcification is likely
physiologic in this age group.

Vascular: There is no demonstrable hyperdense vessel. There is
calcification in each carotid siphon region.

Skull: The bony calvarium appears intact. There is a benign
exostosis at the right frontoparietal junction region, stable.

Sinuses/Orbits: There is mucosal thickening in several ethmoid air
cells. Other visualized paranasal sinuses are clear. Visualized
orbits appear symmetric bilaterally.

Other: Mastoid air cells are clear.
IMPRESSION: Age related volume loss with slight periventricular small vessel
disease. No acute infarct. No mass or hemorrhage.

There are foci of carotid artery calcification. There is mucosal
thickening in several ethmoid air cells.

## 2019-06-29 ENCOUNTER — Other Ambulatory Visit: Payer: Self-pay

## 2019-06-29 DIAGNOSIS — R6889 Other general symptoms and signs: Secondary | ICD-10-CM | POA: Diagnosis not present

## 2019-06-29 DIAGNOSIS — Z20822 Contact with and (suspected) exposure to covid-19: Secondary | ICD-10-CM

## 2019-07-01 LAB — NOVEL CORONAVIRUS, NAA: SARS-CoV-2, NAA: NOT DETECTED

## 2019-07-26 ENCOUNTER — Other Ambulatory Visit: Payer: Self-pay | Admitting: Family

## 2019-07-26 DIAGNOSIS — G4700 Insomnia, unspecified: Secondary | ICD-10-CM

## 2019-07-26 DIAGNOSIS — F411 Generalized anxiety disorder: Secondary | ICD-10-CM

## 2019-08-11 DIAGNOSIS — M069 Rheumatoid arthritis, unspecified: Secondary | ICD-10-CM | POA: Diagnosis not present

## 2019-08-11 DIAGNOSIS — G4700 Insomnia, unspecified: Secondary | ICD-10-CM | POA: Diagnosis not present

## 2019-08-11 DIAGNOSIS — F329 Major depressive disorder, single episode, unspecified: Secondary | ICD-10-CM | POA: Diagnosis not present

## 2019-08-11 DIAGNOSIS — E669 Obesity, unspecified: Secondary | ICD-10-CM | POA: Diagnosis not present

## 2019-08-11 DIAGNOSIS — I10 Essential (primary) hypertension: Secondary | ICD-10-CM | POA: Diagnosis not present

## 2019-08-11 DIAGNOSIS — Z6830 Body mass index (BMI) 30.0-30.9, adult: Secondary | ICD-10-CM | POA: Diagnosis not present

## 2019-08-11 DIAGNOSIS — F419 Anxiety disorder, unspecified: Secondary | ICD-10-CM | POA: Diagnosis not present

## 2019-08-11 DIAGNOSIS — G629 Polyneuropathy, unspecified: Secondary | ICD-10-CM | POA: Diagnosis not present

## 2019-08-11 DIAGNOSIS — Z79899 Other long term (current) drug therapy: Secondary | ICD-10-CM | POA: Diagnosis not present

## 2019-08-11 DIAGNOSIS — R7303 Prediabetes: Secondary | ICD-10-CM | POA: Diagnosis not present

## 2019-08-11 DIAGNOSIS — D179 Benign lipomatous neoplasm, unspecified: Secondary | ICD-10-CM | POA: Diagnosis not present

## 2019-08-11 DIAGNOSIS — Z1159 Encounter for screening for other viral diseases: Secondary | ICD-10-CM | POA: Diagnosis not present

## 2019-08-17 ENCOUNTER — Ambulatory Visit: Payer: PPO | Admitting: Nurse Practitioner

## 2019-08-28 DIAGNOSIS — Z7189 Other specified counseling: Secondary | ICD-10-CM | POA: Diagnosis not present

## 2019-08-28 DIAGNOSIS — I10 Essential (primary) hypertension: Secondary | ICD-10-CM | POA: Diagnosis not present

## 2019-08-28 DIAGNOSIS — H9209 Otalgia, unspecified ear: Secondary | ICD-10-CM | POA: Diagnosis not present

## 2019-08-28 DIAGNOSIS — F419 Anxiety disorder, unspecified: Secondary | ICD-10-CM | POA: Diagnosis not present

## 2019-08-28 DIAGNOSIS — F329 Major depressive disorder, single episode, unspecified: Secondary | ICD-10-CM | POA: Diagnosis not present

## 2019-08-28 DIAGNOSIS — G629 Polyneuropathy, unspecified: Secondary | ICD-10-CM | POA: Diagnosis not present

## 2019-08-28 DIAGNOSIS — E669 Obesity, unspecified: Secondary | ICD-10-CM | POA: Diagnosis not present

## 2019-08-28 DIAGNOSIS — Z008 Encounter for other general examination: Secondary | ICD-10-CM | POA: Diagnosis not present

## 2019-08-28 DIAGNOSIS — G4700 Insomnia, unspecified: Secondary | ICD-10-CM | POA: Diagnosis not present

## 2019-08-28 DIAGNOSIS — I493 Ventricular premature depolarization: Secondary | ICD-10-CM | POA: Diagnosis not present

## 2019-08-28 DIAGNOSIS — Z0001 Encounter for general adult medical examination with abnormal findings: Secondary | ICD-10-CM | POA: Diagnosis not present

## 2019-08-28 DIAGNOSIS — I70203 Unspecified atherosclerosis of native arteries of extremities, bilateral legs: Secondary | ICD-10-CM | POA: Diagnosis not present

## 2019-10-05 ENCOUNTER — Ambulatory Visit: Payer: Self-pay

## 2019-10-05 ENCOUNTER — Encounter: Payer: Self-pay | Admitting: Family

## 2019-10-06 ENCOUNTER — Encounter: Payer: Self-pay | Admitting: Family

## 2019-12-22 DIAGNOSIS — H0102A Squamous blepharitis right eye, upper and lower eyelids: Secondary | ICD-10-CM | POA: Diagnosis not present

## 2019-12-22 DIAGNOSIS — H11153 Pinguecula, bilateral: Secondary | ICD-10-CM | POA: Diagnosis not present

## 2019-12-22 DIAGNOSIS — H04123 Dry eye syndrome of bilateral lacrimal glands: Secondary | ICD-10-CM | POA: Diagnosis not present

## 2019-12-22 DIAGNOSIS — H0102B Squamous blepharitis left eye, upper and lower eyelids: Secondary | ICD-10-CM | POA: Diagnosis not present

## 2019-12-22 DIAGNOSIS — H43811 Vitreous degeneration, right eye: Secondary | ICD-10-CM | POA: Diagnosis not present

## 2019-12-22 DIAGNOSIS — H11823 Conjunctivochalasis, bilateral: Secondary | ICD-10-CM | POA: Diagnosis not present

## 2019-12-22 DIAGNOSIS — H35362 Drusen (degenerative) of macula, left eye: Secondary | ICD-10-CM | POA: Diagnosis not present

## 2020-01-12 DIAGNOSIS — H0102B Squamous blepharitis left eye, upper and lower eyelids: Secondary | ICD-10-CM | POA: Diagnosis not present

## 2020-01-12 DIAGNOSIS — H353131 Nonexudative age-related macular degeneration, bilateral, early dry stage: Secondary | ICD-10-CM | POA: Diagnosis not present

## 2020-01-12 DIAGNOSIS — H04123 Dry eye syndrome of bilateral lacrimal glands: Secondary | ICD-10-CM | POA: Diagnosis not present

## 2020-01-12 DIAGNOSIS — H0102A Squamous blepharitis right eye, upper and lower eyelids: Secondary | ICD-10-CM | POA: Diagnosis not present

## 2020-01-12 DIAGNOSIS — Z961 Presence of intraocular lens: Secondary | ICD-10-CM | POA: Diagnosis not present

## 2020-01-12 DIAGNOSIS — H11153 Pinguecula, bilateral: Secondary | ICD-10-CM | POA: Diagnosis not present

## 2020-01-12 DIAGNOSIS — H18413 Arcus senilis, bilateral: Secondary | ICD-10-CM | POA: Diagnosis not present

## 2020-01-12 DIAGNOSIS — H5213 Myopia, bilateral: Secondary | ICD-10-CM | POA: Diagnosis not present

## 2020-01-12 DIAGNOSIS — H35362 Drusen (degenerative) of macula, left eye: Secondary | ICD-10-CM | POA: Diagnosis not present

## 2020-01-12 DIAGNOSIS — H43811 Vitreous degeneration, right eye: Secondary | ICD-10-CM | POA: Diagnosis not present

## 2020-01-12 DIAGNOSIS — H11823 Conjunctivochalasis, bilateral: Secondary | ICD-10-CM | POA: Diagnosis not present

## 2020-02-22 DIAGNOSIS — I70203 Unspecified atherosclerosis of native arteries of extremities, bilateral legs: Secondary | ICD-10-CM | POA: Diagnosis not present

## 2020-02-22 DIAGNOSIS — F419 Anxiety disorder, unspecified: Secondary | ICD-10-CM | POA: Diagnosis not present

## 2020-02-22 DIAGNOSIS — F331 Major depressive disorder, recurrent, moderate: Secondary | ICD-10-CM | POA: Diagnosis not present

## 2020-02-22 DIAGNOSIS — Z008 Encounter for other general examination: Secondary | ICD-10-CM | POA: Diagnosis not present

## 2020-02-22 DIAGNOSIS — I10 Essential (primary) hypertension: Secondary | ICD-10-CM | POA: Diagnosis not present

## 2020-02-22 DIAGNOSIS — G4700 Insomnia, unspecified: Secondary | ICD-10-CM | POA: Diagnosis not present

## 2020-02-22 DIAGNOSIS — M069 Rheumatoid arthritis, unspecified: Secondary | ICD-10-CM | POA: Diagnosis not present

## 2020-02-22 DIAGNOSIS — I700 Atherosclerosis of aorta: Secondary | ICD-10-CM | POA: Diagnosis not present

## 2020-02-22 DIAGNOSIS — E669 Obesity, unspecified: Secondary | ICD-10-CM | POA: Diagnosis not present

## 2020-02-22 DIAGNOSIS — Z6831 Body mass index (BMI) 31.0-31.9, adult: Secondary | ICD-10-CM | POA: Diagnosis not present

## 2020-02-22 DIAGNOSIS — Z0000 Encounter for general adult medical examination without abnormal findings: Secondary | ICD-10-CM | POA: Diagnosis not present

## 2020-04-01 DIAGNOSIS — G4700 Insomnia, unspecified: Secondary | ICD-10-CM | POA: Diagnosis not present

## 2020-04-01 DIAGNOSIS — Z008 Encounter for other general examination: Secondary | ICD-10-CM | POA: Diagnosis not present

## 2020-04-01 DIAGNOSIS — G629 Polyneuropathy, unspecified: Secondary | ICD-10-CM | POA: Diagnosis not present

## 2020-04-01 DIAGNOSIS — R011 Cardiac murmur, unspecified: Secondary | ICD-10-CM | POA: Diagnosis not present

## 2020-04-04 ENCOUNTER — Other Ambulatory Visit: Payer: Self-pay | Admitting: General Practice

## 2020-04-04 DIAGNOSIS — Z780 Asymptomatic menopausal state: Secondary | ICD-10-CM

## 2020-05-23 ENCOUNTER — Other Ambulatory Visit: Payer: PPO

## 2020-07-13 ENCOUNTER — Ambulatory Visit
Admission: RE | Admit: 2020-07-13 | Discharge: 2020-07-13 | Disposition: A | Discharge status: Home / Self Care | Payer: PPO | Source: Ambulatory Visit | Attending: General Practice | Admitting: General Practice

## 2020-07-13 ENCOUNTER — Other Ambulatory Visit: Payer: Self-pay

## 2020-07-13 DIAGNOSIS — M85852 Other specified disorders of bone density and structure, left thigh: Secondary | ICD-10-CM | POA: Diagnosis not present

## 2020-07-13 DIAGNOSIS — M810 Age-related osteoporosis without current pathological fracture: Secondary | ICD-10-CM | POA: Diagnosis not present

## 2020-07-13 DIAGNOSIS — Z780 Asymptomatic menopausal state: Secondary | ICD-10-CM

## 2020-07-15 DIAGNOSIS — Z008 Encounter for other general examination: Secondary | ICD-10-CM | POA: Diagnosis not present

## 2020-07-15 DIAGNOSIS — Z0000 Encounter for general adult medical examination without abnormal findings: Secondary | ICD-10-CM | POA: Diagnosis not present

## 2020-07-15 DIAGNOSIS — E669 Obesity, unspecified: Secondary | ICD-10-CM | POA: Diagnosis not present

## 2020-07-15 DIAGNOSIS — M069 Rheumatoid arthritis, unspecified: Secondary | ICD-10-CM | POA: Diagnosis not present

## 2020-07-15 DIAGNOSIS — I10 Essential (primary) hypertension: Secondary | ICD-10-CM | POA: Diagnosis not present

## 2020-08-10 ENCOUNTER — Other Ambulatory Visit: Payer: Self-pay | Admitting: Nurse Practitioner

## 2020-08-10 DIAGNOSIS — F411 Generalized anxiety disorder: Secondary | ICD-10-CM

## 2020-08-10 DIAGNOSIS — G4700 Insomnia, unspecified: Secondary | ICD-10-CM

## 2020-08-13 ENCOUNTER — Ambulatory Visit: Payer: PPO | Attending: Internal Medicine

## 2020-08-13 DIAGNOSIS — Z23 Encounter for immunization: Secondary | ICD-10-CM

## 2020-08-13 NOTE — Progress Notes (Signed)
   Covid-19 Vaccination Clinic  Name:  LAYLAA GUEVARRA    MRN: [id number] DOB: [birth year]  08/13/2020  Ms. Slee was observed post Covid-19 immunization for 15 minutes without incident. She was provided with Vaccine Information Sheet and instruction to access the V-Safe system.   Ms. Braatz was instructed to call 911 with any severe reactions post vaccine: [name] Difficulty breathing  . Swelling of face and throat  . A fast heartbeat  . A bad rash all over body  . Dizziness and weakness

## 2020-10-12 DIAGNOSIS — Z6831 Body mass index (BMI) 31.0-31.9, adult: Secondary | ICD-10-CM | POA: Diagnosis not present

## 2020-10-12 DIAGNOSIS — Z0001 Encounter for general adult medical examination with abnormal findings: Secondary | ICD-10-CM | POA: Diagnosis not present

## 2020-10-12 DIAGNOSIS — R7303 Prediabetes: Secondary | ICD-10-CM | POA: Diagnosis not present

## 2020-10-12 DIAGNOSIS — J3489 Other specified disorders of nose and nasal sinuses: Secondary | ICD-10-CM | POA: Diagnosis not present

## 2020-10-12 DIAGNOSIS — R1310 Dysphagia, unspecified: Secondary | ICD-10-CM | POA: Diagnosis not present

## 2020-10-12 DIAGNOSIS — Z79899 Other long term (current) drug therapy: Secondary | ICD-10-CM | POA: Diagnosis not present

## 2020-10-12 DIAGNOSIS — Z008 Encounter for other general examination: Secondary | ICD-10-CM | POA: Diagnosis not present

## 2020-10-12 DIAGNOSIS — I10 Essential (primary) hypertension: Secondary | ICD-10-CM | POA: Diagnosis not present

## 2020-10-12 DIAGNOSIS — B360 Pityriasis versicolor: Secondary | ICD-10-CM | POA: Diagnosis not present

## 2020-10-12 DIAGNOSIS — F419 Anxiety disorder, unspecified: Secondary | ICD-10-CM | POA: Diagnosis not present

## 2020-10-12 DIAGNOSIS — E669 Obesity, unspecified: Secondary | ICD-10-CM | POA: Diagnosis not present

## 2020-10-12 DIAGNOSIS — M8580 Other specified disorders of bone density and structure, unspecified site: Secondary | ICD-10-CM | POA: Diagnosis not present

## 2020-10-13 DIAGNOSIS — Z79899 Other long term (current) drug therapy: Secondary | ICD-10-CM | POA: Diagnosis not present

## 2021-06-01 ENCOUNTER — Other Ambulatory Visit: Payer: Self-pay

## 2021-06-01 ENCOUNTER — Encounter (HOSPITAL_COMMUNITY): Payer: Self-pay

## 2021-06-01 ENCOUNTER — Ambulatory Visit (HOSPITAL_COMMUNITY)
Admission: EM | Admit: 2021-06-01 | Discharge: 2021-06-01 | Disposition: A | Discharge status: Home / Self Care | Payer: Medicare HMO | Attending: Emergency Medicine | Admitting: Emergency Medicine

## 2021-06-01 DIAGNOSIS — F411 Generalized anxiety disorder: Secondary | ICD-10-CM

## 2021-06-01 DIAGNOSIS — R202 Paresthesia of skin: Secondary | ICD-10-CM

## 2021-06-01 DIAGNOSIS — G4700 Insomnia, unspecified: Secondary | ICD-10-CM | POA: Diagnosis not present

## 2021-06-01 MED ORDER — IBUPROFEN 600 MG PO TABS: 600.0000 mg | ORAL_TABLET | Freq: Four times a day (QID) | ORAL | 0 refills | Status: DC | PRN

## 2021-06-01 MED ORDER — ACETAMINOPHEN 500 MG PO TABS: 500.0000 mg | ORAL_TABLET | Freq: Four times a day (QID) | ORAL | 0 refills | Status: AC | PRN

## 2021-06-01 MED ORDER — TRAZODONE HCL 50 MG PO TABS: 75.0000 mg | ORAL_TABLET | Freq: Every evening | ORAL | 1 refills | Status: DC | PRN

## 2021-06-01 NOTE — Discharge Instructions (Addendum)
Can increase dose of trazodone to 1-1/2 to 2 pills every night before bed to see if this provides you relief  Can use ibuprofen every 6 hours as needed in addition to Tylenol every 6 hours as needed for pain  Follow-up with current primary care provider in 1 month for reevaluation concerns  A primary care referral has been placed to help you find a new doctor  Keep appointment with gastrointestinal specialist for evaluation of swallowing

## 2021-06-01 NOTE — ED Triage Notes (Signed)
Pt presents with c/o being unable to sleep x 1 week.   States she has trouble swallowing. C/o burning and tingling in the skin.

## 2021-06-02 NOTE — ED Provider Notes (Signed)
[facility]    CSN: [id number] Arrival date & time: 06/01/21  1125      History   Chief Complaint Chief Complaint  Patient presents with   Sore Throat   Headache    HPI Alexandria Beck is a 87 y.o. female.   Patient presents with difficulty sleeping for 1 week. Has stayed up as late as 4 am. Denies changes to sleep habits, new life stressors. Attempts to be in bed around 9-10 pm. Has difficulty falling asleep and occasionally wakes up throughout the night. Has history of insomnia, taking trazodone, using for years, believes she takes 300 mg, only takes one pill, prescription written for 50 mg. Has not attempted use of any otc medications.   Concerned with tingling in left foot, worsened over the last 2 weeks, has been present for years. Endorses rheumatoid arthritis, using tylenol arthritis for management with no relief. ROM intact. Able to bear weight. History of DM type 2, denies medication use, does not monitor CBG. Last A1C per records 6.1.   Past Medical History:  Diagnosis Date   Allergy    seasonal   Diverticulosis 2004   Esophageal stricture    GERD (gastroesophageal reflux disease)    has had EGD 99991111   Helicobacter positive gastritis 2006   Hypertension     On no medication. Home readings SBP=150's   Internal hemorrhoids    Irregular heart beat    no testing, holter, etc. Takes no medication   Near syncope    diagnosed as anxiety related. gets some relief with clonazepam. No formal testing done.    Patient Active Problem List   Diagnosis Date Noted   Lipoma of left upper extremity 01/16/2019   Osteoporosis 10/30/2018   Left lumbar radiculopathy 06/05/2016   Generalized anxiety disorder 04/18/2016   Neck pain 04/18/2016   Tinnitus 04/18/2016   Varicose veins of both lower extremities 03/11/2015   Pain in the chest    Chest pain 12/21/2014   Insomnia 07/08/2014   PVC's (premature ventricular contractions) 05/20/2014   Murmur 05/20/2014    Dysphagia 11/12/2013   Routine health maintenance 10/14/2013   Other and unspecified hyperlipidemia 10/13/2013   Chronic back pain 10/13/2013   Sleep disorder not due to substance or known physiological condition 09/26/2013   Paresthesia of lower extremity 01/08/2013   Paresthesia of hand 06/18/2012   Urticaria 08/28/2011   Anxiety 08/19/2011   Near syncope    GERD (gastroesophageal reflux disease)    Hypertension     Past Surgical History:  Procedure Laterality Date   ABDOMINAL HYSTERECTOMY  1974   fibroid tumors w/ metorrhagia   G4P4 - NSVD  midwife delivery for two     TONSILLECTOMY     TONSILLECTOMY      OB History   No obstetric history on file.      Home Medications    Prior to Admission medications   Medication Sig Start Date End Date Taking? Authorizing Provider  acetaminophen (TYLENOL) 500 MG tablet Take 1 tablet (500 mg total) by mouth every 6 (six) hours as needed. 06/01/21  Yes Linh Johannes R, NP  ibuprofen (ADVIL) 600 MG tablet Take 1 tablet (600 mg total) by mouth every 6 (six) hours as needed. 06/01/21  Yes Hibah Odonnell R, NP  atorvastatin (LIPITOR) 10 MG tablet TAKE 1 TABLET(10 MG) BY MOUTH AT BEDTIME 10/17/18   Lauree Chandler, NP  cetirizine (ZYRTEC) 5 MG tablet Take 1 tablet (5 mg total) by mouth  daily. allergies 01/19/19   Lauree Chandler, NP  Cholecalciferol (VITAMIN D3) 50 MCG (2000 UT) capsule Take 2,000 Units by mouth every other day.     [provider]  Menthol, Topical Analgesic, (BIOFREEZE) 4 % GEL Apply 3 oz topically 3 (three) times daily as needed. 12/08/18   Ngetich, Nelda Bucks, NP  OVER THE COUNTER MEDICATION Apply 1 application topically daily. "Two Old Goats - Moisturizing lotion"    [provider]  PATADAY 0.2 % SOLN 1 drop in right eye every morning 07/19/15   [provider]  polyethylene glycol powder (GLYCOLAX/MIRALAX) powder Take 17 g by mouth daily. Hold for loose stool 01/16/19   Ngetich, Dinah C, NP   RESTASIS 0.05 % ophthalmic emulsion Place 1 drop into both eyes 2 (two) times daily.  12/20/14   [provider]  Sodium Bicarbonate-Citric Acid (ALKA-SELTZER HEARTBURN PO) Take by mouth as needed.    [provider]  telmisartan (MICARDIS) 40 MG tablet TAKE 1 TABLET(40 MG) BY MOUTH DAILY FOR BLOOD PRESSURE 10/17/18   Lauree Chandler, NP  traZODone (DESYREL) 50 MG tablet Take 1.5-2 tablets (75-100 mg total) by mouth at bedtime as needed for sleep. 06/01/21   Della Scrivener, Leitha Schuller, NP  vitamin C (ASCORBIC ACID) 500 MG tablet Take 500 mg by mouth daily.    [provider]    Family History Family History  Problem Relation Age of Onset   Heart disease Mother    Stroke Mother    Hypertension Mother    Cancer Father        lung cancer - smoker   Heart disease Sister        MI   Kidney disease Sister        end stage renal disease on HD   Diabetes Sister    Heart disease Brother    Diabetes Sister    Hypertension Sister    Kidney disease Sister    Cancer Sister    Diabetes Sister    Kidney disease Sister        HD   Hypertension Sister    Heart disease Sister    Hypertension Sister    Heart disease Sister    Hypertension Sister    Hypertension Sister     Social History Social History   Tobacco Use   Smoking status: Never   Smokeless tobacco: Never  Vaping Use   Vaping Use: Never used  Substance Use Topics   Alcohol use: No    Alcohol/week: 0.0 standard drinks   Drug use: No     Allergies   Clonazepam, Codeine, Lexapro [escitalopram oxalate], and Aspirin   Review of Systems Review of Systems Defer to HPI    Physical Exam Triage Vital Signs ED Triage Vitals  Enc Vitals Group     BP 06/01/21 1249 (!) 158/78     Pulse Rate 06/01/21 1249 93     Resp 06/01/21 1249 15     Temp 06/01/21 1249 98.4 F (36.9 C)     Temp Source 06/01/21 1249 Oral     SpO2 06/01/21 1249 98 %     Weight --      Height --      Head Circumference --       Peak Flow --      Pain Score 06/01/21 1247 8     Pain Loc --      Pain Edu? --      Excl. in [place]? --  No data found.  Updated Vital Signs BP (!) 158/78 (BP Location: Right Arm)   Pulse 93   Temp 98.4 F (36.9 C) (Oral)   Resp 15   SpO2 98%   Visual Acuity Right Eye Distance:   Left Eye Distance:   Bilateral Distance:    Right Eye Near:   Left Eye Near:    Bilateral Near:     Physical Exam Constitutional:      Appearance: Normal appearance. She is well-developed and normal weight.  HENT:     Head: Normocephalic.  Eyes:     Extraocular Movements: Extraocular movements intact.  Musculoskeletal:     Right ankle: Normal.     Right Achilles Tendon: Normal.     Left ankle: Normal.     Left Achilles Tendon: Normal.     Comments: No swelling, ecchymosis, point tenderness noted, ROM intact   Skin:    General: Skin is warm and dry.  Neurological:     Mental Status: She is alert and oriented to person, place, and time. Mental status is at baseline.  Psychiatric:        Mood and Affect: Mood normal.        Behavior: Behavior normal.     UC Treatments / Results  Labs (all labs ordered are listed, but only abnormal results are displayed) Labs Reviewed - No data to display  EKG   Radiology No results found.  Procedures Procedures (including critical care time)  Medications Ordered in UC Medications - No data to display  Initial Impression / Assessment and Plan / UC Course  I have reviewed the triage vital signs and the nursing notes.  Pertinent labs & imaging results that were available during my care of the patient were reviewed by me and considered in my medical decision making (see chart for details).  Insomnia Paresthesia of left foot  Increased trazodone 50 mg to 75-100 mg at bedtime,  Ibuprofen 600 mg every 6 hours prn Tylenol 500 mg every 6 hours prn  PCP follow up in 1 month for reevaluation, considering switching PCP, referral placed       Final Clinical Impressions(s) / UC Diagnoses   Final diagnoses:  Insomnia, unspecified type  Paresthesia of left foot     Discharge Instructions      Can increase dose of trazodone to 1-1/2 to 2 pills every night before bed to see if this provides you relief  Can use ibuprofen every 6 hours as needed in addition to Tylenol every 6 hours as needed for pain  Follow-up with current primary care provider in 1 month for reevaluation concerns  A primary care referral has been placed to help you find a new doctor  Keep appointment with gastrointestinal specialist for evaluation of swallowing       ED Prescriptions     Medication Sig Dispense Auth. Provider   traZODone (DESYREL) 50 MG tablet Take 1.5-2 tablets (75-100 mg total) by mouth at bedtime as needed for sleep. 90 tablet Abanoub Hanken R, NP   ibuprofen (ADVIL) 600 MG tablet Take 1 tablet (600 mg total) by mouth every 6 (six) hours as needed. 30 tablet Eyvette Cordon, Vincente Liberty R, NP   acetaminophen (TYLENOL) 500 MG tablet Take 1 tablet (500 mg total) by mouth every 6 (six) hours as needed. 30 tablet Hans Eden, NP      PDMP not reviewed this encounter.   Hans Eden, NP 06/02/21 1003

## 2021-06-13 ENCOUNTER — Encounter: Payer: Self-pay | Admitting: *Deleted

## 2021-06-29 ENCOUNTER — Encounter: Payer: Self-pay | Admitting: Physician Assistant

## 2021-06-29 ENCOUNTER — Ambulatory Visit: Payer: Medicare HMO | Admitting: Physician Assistant

## 2021-06-29 VITALS — BP 140/90 | HR 76 | Ht 61.0 in | Wt 171.5 lb

## 2021-06-29 DIAGNOSIS — R1310 Dysphagia, unspecified: Secondary | ICD-10-CM | POA: Diagnosis not present

## 2021-06-29 DIAGNOSIS — K30 Functional dyspepsia: Secondary | ICD-10-CM | POA: Diagnosis not present

## 2021-06-29 DIAGNOSIS — K219 Gastro-esophageal reflux disease without esophagitis: Secondary | ICD-10-CM | POA: Diagnosis not present

## 2021-06-29 MED ORDER — PANTOPRAZOLE SODIUM 40 MG PO TBEC
40.0000 mg | DELAYED_RELEASE_TABLET | Freq: Every day | ORAL | 11 refills | Status: DC
Start: 2021-06-29 — End: 2022-06-06

## 2021-06-29 NOTE — Progress Notes (Signed)
Subjective:    Patient ID: Alexandria Beck, female    DOB: 09/04/1931, 87 y.o.   MRN: [id number]  HPI Alexandria Beck is a pleasant 87 year old African-American female, established with Dr. Loletha Carrow, who was last seen in 2018. She comes in today with recurring/persistent issues with vague dysphagia and a sensation of feeling as if she is only swallowing on the right side of her throat/esophagus.  She says that she can feel food going down through this area on the right side though does not find it particularly painful.  She is not having any distinct sensation of food lodging but sometimes feels that food sits for a while.  No episodes requiring regurgitation.  Denies any issues with liquids.  Appetite is fair, weight has been stable.  She says she has not had a good appetite for a few years, she usually drinks a boost and a cup of coffee for breakfast. She has been having some recent heartburn and indigestion and says in the past we have given her medication which has helped all of her symptoms.  She denies any abdominal pain. She usually takes 1 Advil PM to help her sleep, no other aspirin or NSAID use. Other medical problems include hypertension, history of PVCs, anxiety. She has history of presbyesophagus and also had prior esophageal stricture requiring dilations in the past, this was last done in 2013 per Dr. Deatra Ina with dilation of stricture at the GE junction to 18 mm. At the time of her last office visit per Dr. 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 She had barium swallow in 2015 which showed mild tertiary contractions consistent with presbyesophagus, there is no distal stricture noted, no esophageal narrowing and no hiatal hernia.  Review of Systems.Pertinent positive and negative review of systems were noted in the above HPI section.  All other review of systems was otherwise negative.   Outpatient Encounter Medications as of 06/29/2021  Medication  Sig   acetaminophen (TYLENOL) 500 MG tablet Take 1 tablet (500 mg total) by mouth every 6 (six) hours as needed.   alum & mag hydroxide-simeth (MAALOX/MYLANTA) 200-200-20 MG/5ML suspension Take 30 mLs by mouth every 6 (six) hours as needed for indigestion or heartburn.   pantoprazole (PROTONIX) 40 MG tablet Take 1 tablet (40 mg total) by mouth daily before breakfast.   telmisartan (MICARDIS) 40 MG tablet TAKE 1 TABLET(40 MG) BY MOUTH DAILY FOR BLOOD PRESSURE (Patient taking differently: 1 mg.)   traZODone (DESYREL) 50 MG tablet Take 1.5-2 tablets (75-100 mg total) by mouth at bedtime as needed for sleep.   [DISCONTINUED] atorvastatin (LIPITOR) 10 MG tablet TAKE 1 TABLET(10 MG) BY MOUTH AT BEDTIME (Patient not taking: Reported on 06/29/2021)   [DISCONTINUED] cetirizine (ZYRTEC) 5 MG tablet Take 1 tablet (5 mg total) by mouth daily. allergies (Patient not taking: Reported on 06/29/2021)   [DISCONTINUED] Cholecalciferol (VITAMIN D3) 50 MCG (2000 UT) capsule Take 2,000 Units by mouth every other day.  (Patient not taking: Reported on 06/29/2021)   [DISCONTINUED] ibuprofen (ADVIL) 600 MG tablet Take 1 tablet (600 mg total) by mouth every 6 (six) hours as needed. (Patient not taking: Reported on 06/29/2021)   [DISCONTINUED] Menthol, Topical Analgesic, (BIOFREEZE) 4 % GEL Apply 3 oz topically 3 (three) times daily as needed. (Patient not taking: Reported on 06/29/2021)   [DISCONTINUED] OVER THE COUNTER MEDICATION Apply 1 application topically daily. "Two Old Goats - Moisturizing lotion" (Patient not taking: No sig reported)   [DISCONTINUED] PATADAY 0.2 % SOLN  1 drop in right eye every morning (Patient not taking: Reported on 06/29/2021)   [DISCONTINUED] polyethylene glycol powder (GLYCOLAX/MIRALAX) powder Take 17 g by mouth daily. Hold for loose stool (Patient not taking: Reported on 06/29/2021)   [DISCONTINUED] RESTASIS 0.05 % ophthalmic emulsion Place 1 drop into both eyes 2 (two) times daily.  (Patient not taking:  Reported on 06/29/2021)   [DISCONTINUED] Sodium Bicarbonate-Citric Acid (ALKA-SELTZER HEARTBURN PO) Take by mouth as needed. (Patient not taking: Reported on 06/29/2021)   [DISCONTINUED] vitamin C (ASCORBIC ACID) 500 MG tablet Take 500 mg by mouth daily. (Patient not taking: Reported on 06/29/2021)   No facility-administered encounter medications on file as of 06/29/2021.   Allergies  Allergen Reactions   Clonazepam Swelling and Other (See Comments)    Tongue thickening sensation   Codeine Rash   Lexapro [Escitalopram Oxalate] Other (See Comments)    Made her "feel funny"   Aspirin Nausea And Vomiting and Other (See Comments)    Stomach upset   Patient Active Problem List   Diagnosis Date Noted   Lipoma of left upper extremity 01/16/2019   Osteoporosis 10/30/2018   Left lumbar radiculopathy 06/05/2016   Generalized anxiety disorder 04/18/2016   Neck pain 04/18/2016   Tinnitus 04/18/2016   Varicose veins of both lower extremities 03/11/2015   Pain in the chest    Chest pain 12/21/2014   Insomnia 07/08/2014   PVC's (premature ventricular contractions) 05/20/2014   Murmur 05/20/2014   Dysphagia 11/12/2013   Routine health maintenance 10/14/2013   Other and unspecified hyperlipidemia 10/13/2013   Chronic back pain 10/13/2013   Sleep disorder not due to substance or known physiological condition 09/26/2013   Paresthesia of lower extremity 01/08/2013   Paresthesia of hand 06/18/2012   Urticaria 08/28/2011   Anxiety 08/19/2011   Near syncope    GERD (gastroesophageal reflux disease)    Hypertension    Social History   Socioeconomic History   Marital status: Widowed    Spouse name: Not on file   Number of children: 4   Years of education: 50   Highest education level: Not on file  Occupational History   Occupation: stock person    Comment: retired  Tobacco Use   Smoking status: Never   Smokeless tobacco: Never  Vaping Use   Vaping Use: Never used  Substance and Sexual  Activity   Alcohol use: No    Alcohol/week: 0.0 standard drinks   Drug use: No   Sexual activity: Not Currently  Other Topics Concern   Not on file  Social History Narrative   Diet:      Do you drink/ eat things with caffeine? yes      Marital status:   widow                            What year were you married ?  1953      Do you live in a house, apartment,assistred living, condo, trailer, etc.)? house      Is it one or more stories? 1 story      How many persons live in your home ?2      Do you have any pets in your home ?(please list)  none      Current or past profession: [employer]      Do you exercise?  yes  Type & how often:  walking      Do you have a living will?  no      Do you have a DNR form?     no                  If not, do you want to discuss one?       Do you have signed POA?HPOA forms?  no               If so, please bring to your        appointment                     Social Determinants of Health   Financial Resource Strain: Not on file  Food Insecurity: Not on file  Transportation Needs: Not on file  Physical Activity: Not on file  Stress: Not on file  Social Connections: Not on file  Intimate Partner Violence: Not on file    Ms. Roesler's family history includes Cancer in her father and sister; Diabetes in her sister, sister, and sister; Heart disease in her brother, mother, sister, sister, and sister; Hypertension in her mother, sister, sister, sister, sister, and sister; Kidney disease in her sister, sister, and sister; Stroke in her mother.      Objective:    Vitals:   06/29/21 1046  BP: 140/90  Pulse: 76    Physical Exam .Well-developed well-nourished very pleasant elderly African-American female in no acute distress.  Weight, 171 BMI 32.4  HEENT; nontraumatic normocephalic, EOMI, PE R LA, sclera anicteric. Oropharynx; not examined today Neck; supple, no JVD Cardiovascular; regular rate and rhythm with  S1-S2, no murmur rub or gallop Pulmonary; Clear bilaterally Abdomen; soft, nontender, nondistended, no palpable mass or hepatosplenomegaly, bowel sounds are active Rectal; not done today Skin; benign exam, no jaundice rash or appreciable lesions Extremities; no clubbing cyanosis or edema skin warm and dry Neuro/Psych; alert and oriented x4, grossly nonfocal mood and affect appropriate        Assessment & Plan:   #20 87 year old African-American female with history of presbyesophagus, and remote esophageal stricture requiring dilation (last 2013) Patient presents now with vague complaints of dysphagia and a sensation of feeling food traverse particularly on the right side of the throat and esophagus.  Intermittent sensation of food sitting in the esophagus but not lodging and recent intermittent heartburn and indigestion.  #2 hypertension 3.  History of PVCs 4.  Anxiety  Plan; start trial of Protonix 40 mg p.o. every morning AC breakfast/refill sent Patient will be scheduled for barium swallow with a tablet. Further recommendations pending results of barium swallow and response to PPI therapy.  Casyn Becvar S Lakesha Levinson PA-C 06/29/2021   Cc: No ref. provider found

## 2021-06-29 NOTE — Patient Instructions (Signed)
If you are age 85 or older, your body mass index should be between 23-30. Your Body mass index is 32.4 kg/m. If this is out of the aforementioned range listed, please consider follow up with your Primary Care Provider. __________________________________________________________  The [facility] providers would like to encourage you to use [facility] to communicate with providers for non-urgent requests or questions.  Due to long hold times on the telephone, sending your provider a message by [facility] may be a faster and more efficient way to get a response.  Please allow 48 business hours for a response.  Please remember that this is for non-urgent requests.   Cut food up into small pieces, chew foods well, drink small sips of fluid between bites.  You have been scheduled for a Barium Esophogram at [facility] (1st floor of the hospital) on 07/10/2021 at 10:30 am. Please arrive 15 minutes prior to your appointment for registration. Make certain not to have anything to eat or drink 3 hours prior to your test. If you need to reschedule for any reason, please contact radiology at [phone] to do so. __________________________________________________________________ A barium swallow is an examination that concentrates on views of the esophagus. This tends to be a double contrast exam (barium and two liquids which, when combined, create a gas to distend the wall of the oesophagus) or single contrast (non-ionic iodine based). The study is usually tailored to your symptoms so a good history is essential. Attention is paid during the study to the form, structure and configuration of the esophagus, looking for functional disorders (such as aspiration, dysphagia, achalasia, motility and reflux) EXAMINATION You may be asked to change into a gown, depending on the type of swallow being performed. A radiologist and radiographer will perform the procedure. The radiologist will advise you of the type of contrast  selected for your procedure and direct you during the exam. You will be asked to stand, sit or lie in several different positions and to hold a small amount of fluid in your mouth before being asked to swallow while the imaging is performed .In some instances you may be asked to swallow barium coated marshmallows to assess the motility of a solid food bolus. The exam can be recorded as a digital or video fluoroscopy procedure. POST PROCEDURE It will take 1-2 days for the barium to pass through your system. To facilitate this, it is important, unless otherwise directed, to increase your fluids for the next 24-48hrs and to resume your normal diet.  This test typically takes about 30 minutes to perform. ___________________________________________________________  START Pantoprazole 40 mg 1 tablet before breakfast.  Follow up pending the results of your Barium Swallow or as needed.  Thank you for entrusting me with your care and choosing [facility].  Amy Esterwood, PA-C

## 2021-07-01 NOTE — Progress Notes (Signed)
____________________________________________________________  Attending physician addendum:  Thank you for sending this case to me. I have reviewed the entire note and agree with the plan.  The symptoms sound benign and perhaps some dysmotility rather than mechanical stricture. Agree with barium study and trial of medicine.  Conservative approach warranted regarding any considerations of endoscopy in an elderly patient.  Wilfrid Lund, MD  ____________________________________________________________

## 2021-07-10 ENCOUNTER — Ambulatory Visit (HOSPITAL_COMMUNITY)
Admission: RE | Admit: 2021-07-10 | Discharge: 2021-07-10 | Disposition: A | Discharge status: Home / Self Care | Payer: Medicare HMO | Source: Ambulatory Visit | Attending: Physician Assistant | Admitting: Physician Assistant

## 2021-07-10 ENCOUNTER — Other Ambulatory Visit: Payer: Self-pay

## 2021-07-10 DIAGNOSIS — K30 Functional dyspepsia: Secondary | ICD-10-CM | POA: Diagnosis present

## 2021-07-10 DIAGNOSIS — R1310 Dysphagia, unspecified: Secondary | ICD-10-CM | POA: Insufficient documentation

## 2021-07-10 DIAGNOSIS — K219 Gastro-esophageal reflux disease without esophagitis: Secondary | ICD-10-CM | POA: Insufficient documentation

## 2021-07-17 ENCOUNTER — Telehealth: Payer: Self-pay | Admitting: Physician Assistant

## 2021-07-17 NOTE — Telephone Encounter (Signed)
Inbound call from pt requesting a call back stating she was wanting to know her results from her x-ray. Please advise. Thank you.

## 2021-07-18 NOTE — Telephone Encounter (Signed)
Attempted to contact patient x2, phones rings one time then goes to busy signal. Will attempt to contact her again

## 2021-07-18 NOTE — Telephone Encounter (Signed)
DG ESOPHAGUS W DOUBLE CM (HD): Result Notes  Shea Stakes, RN  07/12/2021  4:37 PM EDT     Called patient, patients husband answered. Asked patients husband to let her know to return call at our office at her earliest convenience. Husband verified office number.   Alfredia Ferguson, PA-C  07/12/2021 11:27 AM EDT     Please call pt and let her know the Barium swallow shows mild dysmotility of esophagus- no mass or stricture or other abnormality noted in throat ?pharynx or esophagus- she did have signs of esophageal reflux so I would like her to stay on the Protonix we started.

## 2021-10-31 ENCOUNTER — Other Ambulatory Visit (HOSPITAL_COMMUNITY): Payer: Self-pay | Admitting: Family Medicine

## 2021-10-31 DIAGNOSIS — R011 Cardiac murmur, unspecified: Secondary | ICD-10-CM

## 2021-11-02 ENCOUNTER — Other Ambulatory Visit: Payer: Self-pay

## 2021-11-02 ENCOUNTER — Ambulatory Visit (HOSPITAL_COMMUNITY)
Admission: RE | Admit: 2021-11-02 | Discharge: 2021-11-02 | Disposition: A | Discharge status: Home / Self Care | Payer: Medicare HMO | Source: Ambulatory Visit | Attending: Family Medicine | Admitting: Family Medicine

## 2021-11-02 DIAGNOSIS — R011 Cardiac murmur, unspecified: Secondary | ICD-10-CM | POA: Diagnosis not present

## 2021-11-02 DIAGNOSIS — I119 Hypertensive heart disease without heart failure: Secondary | ICD-10-CM | POA: Insufficient documentation

## 2021-11-02 LAB — ECHOCARDIOGRAM COMPLETE
Area-P 1/2: 3.91 cm2
S' Lateral: 1.9 cm

## 2021-12-21 ENCOUNTER — Emergency Department (HOSPITAL_COMMUNITY)
Admission: EM | Admit: 2021-12-21 | Discharge: 2021-12-21 | Disposition: A | Discharge status: Home / Self Care | Payer: Medicare HMO | Attending: Emergency Medicine | Admitting: Emergency Medicine

## 2021-12-21 ENCOUNTER — Emergency Department (HOSPITAL_COMMUNITY): Payer: Medicare HMO

## 2021-12-21 ENCOUNTER — Encounter (HOSPITAL_COMMUNITY): Payer: Self-pay

## 2021-12-21 ENCOUNTER — Other Ambulatory Visit: Payer: Self-pay

## 2021-12-21 DIAGNOSIS — R202 Paresthesia of skin: Secondary | ICD-10-CM | POA: Diagnosis present

## 2021-12-21 DIAGNOSIS — R5381 Other malaise: Secondary | ICD-10-CM | POA: Insufficient documentation

## 2021-12-21 DIAGNOSIS — R682 Dry mouth, unspecified: Secondary | ICD-10-CM | POA: Diagnosis not present

## 2021-12-21 DIAGNOSIS — I10 Essential (primary) hypertension: Secondary | ICD-10-CM | POA: Diagnosis not present

## 2021-12-21 DIAGNOSIS — F419 Anxiety disorder, unspecified: Secondary | ICD-10-CM | POA: Insufficient documentation

## 2021-12-21 MED ORDER — MELATONIN 3 MG PO TABS
3.0000 mg | ORAL_TABLET | Freq: Once | ORAL | Status: AC
  Administered 2021-12-21: 3 mg via ORAL
  Filled 2021-12-21: qty 1

## 2021-12-21 NOTE — ED Provider Notes (Signed)
[facility] Emergency Department Provider Note MRN:  [id number]  Arrival date & time: 12/21/21     Chief Complaint   Anxiety   History of Present Illness   Alexandria Beck is a 88 y.o. year-old female with a history of hypertension presenting to the ED with chief complaint of anxiety.  Patient is endorsing a tingling sensation all over her body, dry mouth, general malaise.  No chest pain, no shortness of breath, no abdominal pain, no recent leg pain or swelling.  No numbness or weakness to the arms or legs, no headache.  Took some anxiety medicine but still not feeling good.  Review of Systems  A thorough review of systems was obtained and all systems are negative except as noted in the HPI and PMH.   Patient's Health History    Past Medical History:  Diagnosis Date   Allergy    seasonal   Diverticulosis 2004   Esophageal stricture    GERD (gastroesophageal reflux disease)    has had EGD [id number]   Helicobacter positive gastritis 2006   Hypertension     On no medication. Home readings SBP=150's   Internal hemorrhoids    Irregular heart beat    no testing, holter, etc. Takes no medication   Near syncope    diagnosed as anxiety related. gets some relief with clonazepam. No formal testing done.    Past Surgical History:  Procedure Laterality Date   ABDOMINAL HYSTERECTOMY  1974   fibroid tumors w/ metorrhagia   G73P4 - NSVD  midwife delivery for two     TONSILLECTOMY     TONSILLECTOMY      Family History  Problem Relation Age of Onset   Heart disease Mother    Stroke Mother    Hypertension Mother    Cancer Father        lung cancer - smoker   Heart disease Sister        MI   Kidney disease Sister        end stage renal disease on HD   Diabetes Sister    Heart disease Brother    Diabetes Sister    Hypertension Sister    Kidney disease Sister    Cancer Sister    Diabetes Sister    Kidney disease Sister        HD   Hypertension  Sister    Heart disease Sister    Hypertension Sister    Heart disease Sister    Hypertension Sister    Hypertension Sister     Social History   Socioeconomic History   Marital status: Widowed    Spouse name: Not on file   Number of children: 4   Years of education: 35   Highest education level: Not on file  Occupational History   Occupation: stock person    Comment: retired  Tobacco Use   Smoking status: Never   Smokeless tobacco: Never  Vaping Use   Vaping Use: Never used  Substance and Sexual Activity   Alcohol use: No    Alcohol/week: 0.0 standard drinks   Drug use: No   Sexual activity: Not Currently  Other Topics Concern   Not on file  Social History Narrative   Diet:      Do you drink/ eat things with caffeine? yes      Marital status:   widow  What year were you married ?  1953      Do you live in a house, apartment,assistred living, condo, trailer, etc.)? house      Is it one or more stories? 1 story      How many persons live in your home ?2      Do you have any pets in your home ?(please list)  none      Current or past profession: [employer]      Do you exercise?  yes                            Type & how often:  walking      Do you have a living will?  no      Do you have a DNR form?     no                  If not, do you want to discuss one?       Do you have signed POA?HPOA forms?  no               If so, please bring to your        appointment                     Social Determinants of Health   Financial Resource Strain: Not on file  Food Insecurity: Not on file  Transportation Needs: Not on file  Physical Activity: Not on file  Stress: Not on file  Social Connections: Not on file  Intimate Partner Violence: Not on file     Physical Exam   Vitals:   12/21/21 0300 12/21/21 0315  BP: 131/80 139/83  Pulse: 84 79  Resp: 19 (!) 24  Temp:    SpO2: 100% 100%    CONSTITUTIONAL: Well-appearing,  anxious NEURO/PSYCH:  Alert and oriented x 3, normal and symmetric strength and sensation, normal coordination, normal speech EYES:  eyes equal and reactive ENT/NECK:  no LAD, no JVD CARDIO: Regular rate, well-perfused, normal S1 and S2 PULM:  CTAB no wheezing or rhonchi GI/GU:  non-distended, non-tender MSK/SPINE:  No gross deformities, no edema SKIN:  no rash, atraumatic   *Additional and/or pertinent findings included in MDM below  Diagnostic and Interventional Summary    EKG Interpretation  Date/Time:  Thursday December 21 2021 02:24:29 EST Ventricular Rate:  91 PR Interval:  185 QRS Duration: 81 QT Interval:  351 QTC Calculation: 432 R Axis:   66 Text Interpretation: Sinus rhythm Anteroseptal infarct, old Nonspecific T abnormalities, lateral leads Confirmed by Gerlene Fee [phone]) on 12/21/2021 2:37:58 AM       Labs Reviewed - No data to display  DG Chest Port 1 View  Final Result      Medications  melatonin tablet 3 mg (3 mg Oral Given 12/21/21 0232)     Procedures  /  Critical Care Procedures  ED Course and Medical Decision Making  Initial Impression and Ddx Suspect anxiety, will obtain screening EKG and chest x-ray given patient's hyperventilating status at this time.  Will provide a dose of melatonin and let her try to relax and reassess.  Past medical/surgical history that increases complexity of ED encounter: None  Interpretation of Diagnostics I personally reviewed the EKG and Chest Xray and my interpretation is as follows: Sinus rhythm, nonspecific findings, prior comparison is 88 years old.  Chest x-ray is without obvious opacity  or pneumothorax      Patient Reassessment and Ultimate Disposition/Management Patient is feeling better after melatonin.  She is still endorsing occasional tingling sensation that starts in her toes and goes all the way through her body up to the top of her head.  But it is less frequent.  Doubt emergent process, appropriate  for discharge.  Patient management required discussion with the following services or consulting groups:  None  Complexity of Problems Addressed Acute complicated illness or Injury  Additional Data Reviewed and Analyzed Further history obtained from: Further history from spouse/family member  Additional Factors Impacting ED Encounter Risk None  Barth Kirks. Sedonia Small, Hector [email or web address]  Final Clinical Impressions(s) / ED Diagnoses     ICD-10-CM   1. Tingling sensation  R20.2       ED Discharge Orders     None        Discharge Instructions Discussed with and Provided to Patient:     Discharge Instructions      You were evaluated in the Emergency Department and after careful evaluation, we did not find any emergent condition requiring admission or further testing in the hospital.  Your exam/testing today was overall reassuring.  Please return to the Emergency Department if you experience any worsening of your condition.  Thank you for allowing [place] to be a part of your care.        Maudie Flakes, MD 12/21/21 [phone]

## 2021-12-21 NOTE — Discharge Instructions (Signed)
You were evaluated in the Emergency Department and after careful evaluation, we did not find any emergent condition requiring admission or further testing in the hospital.  Your exam/testing today was overall reassuring.  Please return to the Emergency Department if you experience any worsening of your condition.  Thank you for allowing us to be a part of your care.  

## 2021-12-21 NOTE — ED Triage Notes (Signed)
Patient arrived via [facility] with complaints of bilateral leg tingling. Also complaints of a dry mouth. Hx of anxiety, took her home medication tonight.

## 2022-06-06 ENCOUNTER — Encounter: Payer: Self-pay | Admitting: Family Medicine

## 2022-06-06 ENCOUNTER — Ambulatory Visit (INDEPENDENT_AMBULATORY_CARE_PROVIDER_SITE_OTHER): Payer: Medicare HMO | Admitting: Family Medicine

## 2022-06-06 VITALS — BP 199/73 | HR 90 | Temp 98.1°F | Resp 16 | Ht 61.0 in | Wt 164.8 lb

## 2022-06-06 DIAGNOSIS — G4700 Insomnia, unspecified: Secondary | ICD-10-CM | POA: Diagnosis not present

## 2022-06-06 DIAGNOSIS — I10 Essential (primary) hypertension: Secondary | ICD-10-CM | POA: Diagnosis not present

## 2022-06-06 DIAGNOSIS — Z7689 Persons encountering health services in other specified circumstances: Secondary | ICD-10-CM | POA: Diagnosis not present

## 2022-06-06 DIAGNOSIS — K219 Gastro-esophageal reflux disease without esophagitis: Secondary | ICD-10-CM

## 2022-06-06 DIAGNOSIS — E785 Hyperlipidemia, unspecified: Secondary | ICD-10-CM | POA: Diagnosis not present

## 2022-06-06 MED ORDER — ROSUVASTATIN CALCIUM 10 MG PO TABS: 10.0000 mg | ORAL_TABLET | Freq: Every day | ORAL | 1 refills | Status: DC

## 2022-06-06 MED ORDER — TELMISARTAN 40 MG PO TABS: 40.0000 mg | ORAL_TABLET | Freq: Every day | ORAL | 1 refills | Status: AC

## 2022-06-06 MED ORDER — PANTOPRAZOLE SODIUM 40 MG PO TBEC: 40.0000 mg | DELAYED_RELEASE_TABLET | Freq: Every day | ORAL | 1 refills | Status: DC

## 2022-06-06 MED ORDER — MIRTAZAPINE 7.5 MG PO TABS: 7.5000 mg | ORAL_TABLET | Freq: Every day | ORAL | 0 refills | Status: DC

## 2022-06-07 NOTE — Progress Notes (Signed)
New Patient Office Visit  Subjective    Patient ID: Alexandria Beck, female    DOB: September 19, 1931  Age: 88 y.o. MRN: [id number]  CC:  Chief Complaint  Patient presents with   Establish Care    HPI Alexandria Beck presents to establish care and for review of chronic med issues including hypertension and GERD. Patient denies acute complaints or concerns.    Outpatient Encounter Medications as of 06/06/2022  Medication Sig   mirtazapine (REMERON) 7.5 MG tablet Take 1 tablet (7.5 mg total) by mouth at bedtime.   acetaminophen (TYLENOL) 500 MG tablet Take 1 tablet (500 mg total) by mouth every 6 (six) hours as needed.   hydrOXYzine (ATARAX) 25 MG tablet Take 25 mg by mouth daily.   pantoprazole (PROTONIX) 40 MG tablet Take 1 tablet (40 mg total) by mouth daily before breakfast.   rosuvastatin (CRESTOR) 10 MG tablet Take 1 tablet (10 mg total) by mouth daily.   telmisartan (MICARDIS) 40 MG tablet Take 1 tablet (40 mg total) by mouth daily. TAKE 1 TABLET(40 MG) BY MOUTH DAILY FOR BLOOD PRESSURE Strength: 40 mg   traZODone (DESYREL) 50 MG tablet Take 1.5-2 tablets (75-100 mg total) by mouth at bedtime as needed for sleep.   [DISCONTINUED] alum & mag hydroxide-simeth (MAALOX/MYLANTA) 200-200-20 MG/5ML suspension Take 30 mLs by mouth every 6 (six) hours as needed for indigestion or heartburn.   [DISCONTINUED] pantoprazole (PROTONIX) 40 MG tablet Take 1 tablet (40 mg total) by mouth daily before breakfast.   [DISCONTINUED] rosuvastatin (CRESTOR) 10 MG tablet Take by mouth.   [DISCONTINUED] telmisartan (MICARDIS) 40 MG tablet TAKE 1 TABLET(40 MG) BY MOUTH DAILY FOR BLOOD PRESSURE (Patient taking differently: 1 mg.)   No facility-administered encounter medications on file as of 06/06/2022.    Past Medical History:  Diagnosis Date   Allergy    seasonal   Diverticulosis 2004   Esophageal stricture    GERD (gastroesophageal reflux disease)    has had EGD [id number]   Helicobacter positive gastritis  2006   Hypertension     On no medication. Home readings SBP=150's   Internal hemorrhoids    Irregular heart beat    no testing, holter, etc. Takes no medication   Near syncope    diagnosed as anxiety related. gets some relief with clonazepam. No formal testing done.    Past Surgical History:  Procedure Laterality Date   ABDOMINAL HYSTERECTOMY  1974   fibroid tumors w/ metorrhagia   G6P4 - NSVD  midwife delivery for two     TONSILLECTOMY     TONSILLECTOMY      Family History  Problem Relation Age of Onset   Heart disease Mother    Stroke Mother    Hypertension Mother    Cancer Father        lung cancer - smoker   Heart disease Sister        MI   Kidney disease Sister        end stage renal disease on HD   Diabetes Sister    Heart disease Brother    Diabetes Sister    Hypertension Sister    Kidney disease Sister    Cancer Sister    Diabetes Sister    Kidney disease Sister        HD   Hypertension Sister    Heart disease Sister    Hypertension Sister    Heart disease Sister    Hypertension Sister    Hypertension  Sister     Social History   Socioeconomic History   Marital status: Widowed    Spouse name: Not on file   Number of children: 4   Years of education: 80   Highest education level: Not on file  Occupational History   Occupation: stock person    Comment: retired  Tobacco Use   Smoking status: Never   Smokeless tobacco: Never  Vaping Use   Vaping Use: Never used  Substance and Sexual Activity   Alcohol use: No    Alcohol/week: 0.0 standard drinks of alcohol   Drug use: No   Sexual activity: Not Currently  Other Topics Concern   Not on file  Social History Narrative   Diet:      Do you drink/ eat things with caffeine? yes      Marital status:   widow                            What year were you married ?  1953      Do you live in a house, apartment,assistred living, condo, trailer, etc.)? house      Is it one or more stories? 1 story       How many persons live in your home ?2      Do you have any pets in your home ?(please list)  none      Current or past profession: [employer]      Do you exercise?  yes                            Type & how often:  walking      Do you have a living will?  no      Do you have a DNR form?     no                  If not, do you want to discuss one?       Do you have signed POA?HPOA forms?  no               If so, please bring to your        appointment                     Social Determinants of Health   Financial Resource Strain: Low Risk  (09/17/2017)   Overall Financial Resource Strain (CARDIA)    Difficulty of Paying Living Expenses: Not very hard  Food Insecurity: No Food Insecurity (09/17/2017)   Hunger Vital Sign    Worried About Running Out of Food in the Last Year: Never true    Ran Out of Food in the Last Year: Never true  Transportation Needs: No Transportation Needs (09/17/2017)   PRAPARE - Hydrologist (Medical): No    Lack of Transportation (Non-Medical): No  Physical Activity: Insufficiently Active (10/03/2018)   Exercise Vital Sign    Days of Exercise per Week: 2 days    Minutes of Exercise per Session: 20 min  Stress: No Stress Concern Present (09/17/2017)   [place]    Feeling of Stress : Only a little  Social Connections: Moderately Integrated (09/17/2017)   Social Connection and Isolation Panel [NHANES]    Frequency of Communication with Friends and Family: More than three times a  week    Frequency of Social Gatherings with Friends and Family: Three times a week    Attends Religious Services: 1 to 4 times per year    Active Member of Clubs or Organizations: Yes    Attends Archivist Meetings: 1 to 4 times per year    Marital Status: Widowed  Intimate Partner Violence: Not At Risk (09/17/2017)   Humiliation, Afraid, Rape, and Kick questionnaire     Fear of Current or Ex-Partner: No    Emotionally Abused: No    Physically Abused: No    Sexually Abused: No    Review of Systems  All other systems reviewed and are negative.       Objective    BP (!) 199/73   Pulse 90   Temp 98.1 F (36.7 C) (Oral)   Resp 16   Ht '5\' 1"'$  (1.549 m)   Wt 164 lb 12.8 oz (74.8 kg)   SpO2 92%   BMI 31.14 kg/m   Physical Exam Vitals and nursing note reviewed.  Constitutional:      General: She is not in acute distress. Cardiovascular:     Rate and Rhythm: Normal rate and regular rhythm.  Pulmonary:     Effort: Pulmonary effort is normal.     Breath sounds: Normal breath sounds.  Abdominal:     Palpations: Abdomen is soft.     Tenderness: There is no abdominal tenderness.  Musculoskeletal:     Right lower leg: No edema.     Left lower leg: No edema.  Neurological:     General: No focal deficit present.     Mental Status: She is alert and oriented to person, place, and time.         Assessment & Plan:   1. Essential hypertension Slightly elevated readings. Meds refilled. Continue and monitor  - telmisartan (MICARDIS) 40 MG tablet; Take 1 tablet (40 mg total) by mouth daily. TAKE 1 TABLET(40 MG) BY MOUTH DAILY FOR BLOOD PRESSURE Strength: 40 mg  Dispense: 90 tablet; Refill: 1  2. Insomnia, unspecified type Remeron 7.5 mg prescribed. Will monitor  3. Gastroesophageal reflux disease without esophagitis Continue. Meds refilled  4. Hyperlipidemia, unspecified hyperlipidemia type Continue. Meds refilled  5. Encounter to establish care     No follow-ups on file.   Becky Sax, MD

## 2022-07-03 ENCOUNTER — Other Ambulatory Visit: Payer: Self-pay | Admitting: Family Medicine

## 2022-07-09 ENCOUNTER — Encounter: Payer: Self-pay | Admitting: Family Medicine

## 2022-07-09 ENCOUNTER — Ambulatory Visit (INDEPENDENT_AMBULATORY_CARE_PROVIDER_SITE_OTHER): Payer: Medicare HMO | Admitting: Family Medicine

## 2022-07-09 VITALS — BP 136/85 | HR 89 | Temp 97.7°F | Resp 16 | Wt 164.8 lb

## 2022-07-09 DIAGNOSIS — I10 Essential (primary) hypertension: Secondary | ICD-10-CM

## 2022-07-09 DIAGNOSIS — G4700 Insomnia, unspecified: Secondary | ICD-10-CM

## 2022-07-09 DIAGNOSIS — F411 Generalized anxiety disorder: Secondary | ICD-10-CM

## 2022-07-09 MED ORDER — MIRTAZAPINE 15 MG PO TABS: 15.0000 mg | ORAL_TABLET | Freq: Every day | ORAL | 0 refills | Status: DC

## 2022-07-09 NOTE — Progress Notes (Signed)
Patient said she is still having trouble  sleeping at night. Patient said her stomach draws in while she is washing dishes, unconsciously. And then when she finish is loosing up.

## 2022-07-09 NOTE — Progress Notes (Signed)
Established Patient Office Visit  Subjective    Patient ID: Alexandria Beck, female    DOB: [birth year]  Age: 88 y.o. MRN: [id number]  CC:  Chief Complaint  Patient presents with   Follow-up    HPI BRANDACE CARGLE presents for routine follow up of chronic med issues. She reports improvements in her insomnia but would like to increase her med dosage.    Outpatient Encounter Medications as of 07/09/2022  Medication Sig   acetaminophen (TYLENOL) 500 MG tablet Take 1 tablet (500 mg total) by mouth every 6 (six) hours as needed.   hydrOXYzine (ATARAX) 25 MG tablet Take 25 mg by mouth daily.   mirtazapine (REMERON) 15 MG tablet Take 1 tablet (15 mg total) by mouth at bedtime.   pantoprazole (PROTONIX) 40 MG tablet Take 1 tablet (40 mg total) by mouth daily before breakfast.   rosuvastatin (CRESTOR) 10 MG tablet Take 1 tablet (10 mg total) by mouth daily.   telmisartan (MICARDIS) 40 MG tablet Take 1 tablet (40 mg total) by mouth daily. TAKE 1 TABLET(40 MG) BY MOUTH DAILY FOR BLOOD PRESSURE Strength: 40 mg   traZODone (DESYREL) 50 MG tablet Take 1.5-2 tablets (75-100 mg total) by mouth at bedtime as needed for sleep.   [DISCONTINUED] mirtazapine (REMERON) 7.5 MG tablet Take 1 tablet (7.5 mg total) by mouth at bedtime.   No facility-administered encounter medications on file as of 07/09/2022.    Past Medical History:  Diagnosis Date   Allergy    seasonal   Diverticulosis 2004   Esophageal stricture    GERD (gastroesophageal reflux disease)    has had EGD [id number]   Helicobacter positive gastritis 2006   Hypertension     On no medication. Home readings SBP=150's   Internal hemorrhoids    Irregular heart beat    no testing, holter, etc. Takes no medication   Near syncope    diagnosed as anxiety related. gets some relief with clonazepam. No formal testing done.    Past Surgical History:  Procedure Laterality Date   ABDOMINAL HYSTERECTOMY  1974   fibroid tumors w/ metorrhagia    G87P4 - NSVD  midwife delivery for two     TONSILLECTOMY     TONSILLECTOMY      Family History  Problem Relation Age of Onset   Heart disease Mother    Stroke Mother    Hypertension Mother    Cancer Father        lung cancer - smoker   Heart disease Sister        MI   Kidney disease Sister        end stage renal disease on HD   Diabetes Sister    Heart disease Brother    Diabetes Sister    Hypertension Sister    Kidney disease Sister    Cancer Sister    Diabetes Sister    Kidney disease Sister        HD   Hypertension Sister    Heart disease Sister    Hypertension Sister    Heart disease Sister    Hypertension Sister    Hypertension Sister     Social History   Socioeconomic History   Marital status: Widowed    Spouse name: Not on file   Number of children: 4   Years of education: 9   Highest education level: Not on file  Occupational History   Occupation: stock person    Comment: retired  Tobacco Use  Smoking status: Never   Smokeless tobacco: Never  Vaping Use   Vaping Use: Never used  Substance and Sexual Activity   Alcohol use: No    Alcohol/week: 0.0 standard drinks of alcohol   Drug use: No   Sexual activity: Not Currently  Other Topics Concern   Not on file  Social History Narrative   Diet:      Do you drink/ eat things with caffeine? yes      Marital status:   widow                            What year were you married ?  1953      Do you live in a house, apartment,assistred living, condo, trailer, etc.)? house      Is it one or more stories? 1 story      How many persons live in your home ?2      Do you have any pets in your home ?(please list)  none      Current or past profession: [employer]      Do you exercise?  yes                            Type & how often:  walking      Do you have a living will?  no      Do you have a DNR form?     no                  If not, do you want to discuss one?       Do you have signed POA?HPOA  forms?  no               If so, please bring to your        appointment                     Social Determinants of Health   Financial Resource Strain: Low Risk  (09/17/2017)   Overall Financial Resource Strain (CARDIA)    Difficulty of Paying Living Expenses: Not very hard  Food Insecurity: No Food Insecurity (09/17/2017)   Hunger Vital Sign    Worried About Running Out of Food in the Last Year: Never true    Ran Out of Food in the Last Year: Never true  Transportation Needs: No Transportation Needs (09/17/2017)   PRAPARE - Hydrologist (Medical): No    Lack of Transportation (Non-Medical): No  Physical Activity: Insufficiently Active (10/03/2018)   Exercise Vital Sign    Days of Exercise per Week: 2 days    Minutes of Exercise per Session: 20 min  Stress: No Stress Concern Present (09/17/2017)   [place]    Feeling of Stress : Only a little  Social Connections: Moderately Integrated (09/17/2017)   Social Connection and Isolation Panel [NHANES]    Frequency of Communication with Friends and Family: More than three times a week    Frequency of Social Gatherings with Friends and Family: Three times a week    Attends Religious Services: 1 to 4 times per year    Active Member of Clubs or Organizations: Yes    Attends Archivist Meetings: 1 to 4 times per year    Marital Status: Widowed  Intimate Partner Violence:  Not At Risk (09/17/2017)   Humiliation, Afraid, Rape, and Kick questionnaire    Fear of Current or Ex-Partner: No    Emotionally Abused: No    Physically Abused: No    Sexually Abused: No    Review of Systems  Psychiatric/Behavioral:  The patient has insomnia.   All other systems reviewed and are negative.       Objective    BP 136/85   Pulse 89   Temp 97.7 F (36.5 C) (Oral)   Resp 16   Wt 164 lb 12.8 oz (74.8 kg)   SpO2 96%   BMI 31.14 kg/m    Physical Exam Vitals and nursing note reviewed.  Constitutional:      General: She is not in acute distress. Cardiovascular:     Rate and Rhythm: Normal rate and regular rhythm.  Pulmonary:     Effort: Pulmonary effort is normal.     Breath sounds: Normal breath sounds.  Abdominal:     Palpations: Abdomen is soft.     Tenderness: There is no abdominal tenderness.  Musculoskeletal:     Right lower leg: No edema.     Left lower leg: No edema.  Neurological:     General: No focal deficit present.     Mental Status: She is alert and oriented to person, place, and time.         Assessment & Plan:   1. Essential hypertension Appears stable. Continue and monitor  2. Insomnia, unspecified type Will increase remeron from 7.5 to 15 mg at hs and monitor.    3. Generalized anxiety disorder As above    Return in about 3 months (around 10/08/2022) for follow up.   Becky Sax, MD

## 2022-08-17 ENCOUNTER — Telehealth: Payer: Self-pay

## 2022-08-17 NOTE — Telephone Encounter (Signed)
2nd attempt to call pt, unable to reach pt. Vm not set up, unable to leave vm.

## 2022-08-17 NOTE — Telephone Encounter (Signed)
1st attempt, called patient to complete AWV. Unable to reach pt, unable to leave vm. Will attempt patient again in 5-10 minutes to complete AWV.

## 2022-10-04 ENCOUNTER — Other Ambulatory Visit: Payer: Self-pay | Admitting: Family Medicine

## 2022-10-10 ENCOUNTER — Encounter: Payer: Self-pay | Admitting: Family Medicine

## 2022-10-10 ENCOUNTER — Ambulatory Visit (INDEPENDENT_AMBULATORY_CARE_PROVIDER_SITE_OTHER): Payer: Medicare HMO | Admitting: Family Medicine

## 2022-10-10 VITALS — BP 134/86 | HR 92 | Temp 98.1°F | Resp 16 | Ht 61.0 in | Wt 177.4 lb

## 2022-10-10 DIAGNOSIS — E785 Hyperlipidemia, unspecified: Secondary | ICD-10-CM | POA: Diagnosis not present

## 2022-10-10 DIAGNOSIS — F411 Generalized anxiety disorder: Secondary | ICD-10-CM

## 2022-10-10 DIAGNOSIS — I10 Essential (primary) hypertension: Secondary | ICD-10-CM

## 2022-10-10 DIAGNOSIS — Z0000 Encounter for general adult medical examination without abnormal findings: Secondary | ICD-10-CM

## 2022-10-10 DIAGNOSIS — G4700 Insomnia, unspecified: Secondary | ICD-10-CM

## 2022-10-10 MED ORDER — MIRTAZAPINE 15 MG PO TABS: 15.0000 mg | ORAL_TABLET | Freq: Every day | ORAL | 0 refills | Status: AC

## 2022-10-10 MED ORDER — HYDROXYZINE HCL 10 MG PO TABS: 10.0000 mg | ORAL_TABLET | Freq: Three times a day (TID) | ORAL | 2 refills | Status: AC | PRN

## 2022-10-10 NOTE — Progress Notes (Signed)
Subjective:   Alexandria Beck is a 88 y.o. female who presents for Medicare Annual (Subsequent) preventive examination.  Review of Systems   Refer to PCP       Objective:    There were no vitals filed for this visit. There is no height or weight on file to calculate BMI.     04/15/2019    8:35 AM 12/08/2018    2:28 PM 11/29/2018    8:47 AM 10/17/2018    8:14 AM 10/03/2018   10:39 AM 09/25/2017    3:28 PM 09/17/2017    9:35 AM  Advanced Directives  Does Patient Have a Medical Advance Directive? No No No No No No No  Would patient like information on creating a medical advance directive? Yes (ED - Information included in AVS) No - Patient declined No - Patient declined  Yes (MAU/Ambulatory/Procedural Areas - Information given) Yes (MAU/Ambulatory/Procedural Areas - Information given) Yes (MAU/Ambulatory/Procedural Areas - Information given)    Current Medications (verified) Outpatient Encounter Medications as of 10/10/2022  Medication Sig   acetaminophen (TYLENOL) 500 MG tablet Take 1 tablet (500 mg total) by mouth every 6 (six) hours as needed.   hydrOXYzine (ATARAX) 25 MG tablet Take 25 mg by mouth daily.   mirtazapine (REMERON) 15 MG tablet Take 1 tablet (15 mg total) by mouth at bedtime.   pantoprazole (PROTONIX) 40 MG tablet Take 1 tablet (40 mg total) by mouth daily before breakfast.   rosuvastatin (CRESTOR) 10 MG tablet Take 1 tablet (10 mg total) by mouth daily.   telmisartan (MICARDIS) 40 MG tablet Take 1 tablet (40 mg total) by mouth daily. TAKE 1 TABLET(40 MG) BY MOUTH DAILY FOR BLOOD PRESSURE Strength: 40 mg   traZODone (DESYREL) 50 MG tablet Take 1.5-2 tablets (75-100 mg total) by mouth at bedtime as needed for sleep.   No facility-administered encounter medications on file as of 10/10/2022.    Allergies (verified) Clonazepam, Codeine, Lexapro [escitalopram oxalate], and Aspirin   History: Past Medical History:  Diagnosis Date   Allergy    seasonal    Diverticulosis 2004   Esophageal stricture    GERD (gastroesophageal reflux disease)    has had EGD [id number]   Helicobacter positive gastritis 2006   Hypertension     On no medication. Home readings SBP=150's   Internal hemorrhoids    Irregular heart beat    no testing, holter, etc. Takes no medication   Near syncope    diagnosed as anxiety related. gets some relief with clonazepam. No formal testing done.   Past Surgical History:  Procedure Laterality Date   ABDOMINAL HYSTERECTOMY  1974   fibroid tumors w/ metorrhagia   G3P4 - NSVD  midwife delivery for two     TONSILLECTOMY     TONSILLECTOMY     Family History  Problem Relation Age of Onset   Heart disease Mother    Stroke Mother    Hypertension Mother    Cancer Father        lung cancer - smoker   Heart disease Sister        MI   Kidney disease Sister        end stage renal disease on HD   Diabetes Sister    Heart disease Brother    Diabetes Sister    Hypertension Sister    Kidney disease Sister    Cancer Sister    Diabetes Sister    Kidney disease Sister  HD   Hypertension Sister    Heart disease Sister    Hypertension Sister    Heart disease Sister    Hypertension Sister    Hypertension Sister    Social History   Socioeconomic History   Marital status: Widowed    Spouse name: Not on file   Number of children: 4   Years of education: 81   Highest education level: Not on file  Occupational History   Occupation: stock person    Comment: retired  Tobacco Use   Smoking status: Never   Smokeless tobacco: Never  Vaping Use   Vaping Use: Never used  Substance and Sexual Activity   Alcohol use: No    Alcohol/week: 0.0 standard drinks of alcohol   Drug use: No   Sexual activity: Not Currently  Other Topics Concern   Not on file  Social History Narrative   Diet:      Do you drink/ eat things with caffeine? yes      Marital status:   widow                            What year were you  married ?  1953      Do you live in a house, apartment,assistred living, condo, trailer, etc.)? house      Is it one or more stories? 1 story      How many persons live in your home ?2      Do you have any pets in your home ?(please list)  none      Current or past profession: [employer]      Do you exercise?  yes                            Type & how often:  walking      Do you have a living will?  no      Do you have a DNR form?     no                  If not, do you want to discuss one?       Do you have signed POA?HPOA forms?  no               If so, please bring to your        appointment                     Social Determinants of Health   Financial Resource Strain: Low Risk  (09/17/2017)   Overall Financial Resource Strain (CARDIA)    Difficulty of Paying Living Expenses: Not very hard  Food Insecurity: No Food Insecurity (09/17/2017)   Hunger Vital Sign    Worried About Running Out of Food in the Last Year: Never true    Ran Out of Food in the Last Year: Never true  Transportation Needs: No Transportation Needs (09/17/2017)   PRAPARE - Hydrologist (Medical): No    Lack of Transportation (Non-Medical): No  Physical Activity: Insufficiently Active (10/03/2018)   Exercise Vital Sign    Days of Exercise per Week: 2 days    Minutes of Exercise per Session: 20 min  Stress: No Stress Concern Present (09/17/2017)   [place]    Feeling of Stress : Only a  little  Social Connections: Moderately Integrated (09/17/2017)   Social Connection and Isolation Panel [NHANES]    Frequency of Communication with Friends and Family: More than three times a week    Frequency of Social Gatherings with Friends and Family: Three times a week    Attends Religious Services: 1 to 4 times per year    Active Member of Clubs or Organizations: Yes    Attends Archivist Meetings: 1 to 4  times per year    Marital Status: Widowed    Tobacco Counseling Counseling given: Not Answered   Clinical Intake:                 Diabetic?n/a         Activities of Daily Living     No data to display           Patient Care Team: Dorna Mai, MD as PCP - General (Family Medicine) Hendricks Limes, MD as Consulting Physician (Internal Medicine) Shirley Muscat Loreen Freud, MD as Referring Physician (Optometry)  Indicate any recent Medical Services you may have received from other than [facility] providers in the past year (date may be approximate).     Assessment:   This is a routine wellness examination for Aanika.  Hearing/Vision screen No results found.  Dietary issues and exercise activities discussed:     Goals Addressed   None   Depression Screen    07/09/2022   10:34 AM 06/06/2022    1:16 PM 04/15/2019    8:34 AM 10/03/2018   10:39 AM 11/22/2017    8:20 AM 09/17/2017    9:36 AM 09/12/2016    9:38 AM  PHQ 2/9 Scores  PHQ - 2 Score 2 1 0 1 0 0 0  PHQ- 9 Score 5 5         Fall Risk    06/06/2022    1:15 PM 04/15/2019    8:33 AM 01/16/2019    8:14 AM 12/08/2018    2:17 PM 10/17/2018    8:16 AM  Fall Risk   Falls in the past year? 0 0 0 0 0  Number falls in past yr: 0  0 0 0  Injury with Fall? 0 0 0 0 0    FALL RISK PREVENTION PERTAINING TO THE HOME:  Any stairs in or around the home? No  If so, are there any without handrails? No  Home free of loose throw rugs in walkways, pet beds, electrical cords, etc? Yes  Adequate lighting in your home to reduce risk of falls? No   ASSISTIVE DEVICES UTILIZED TO PREVENT FALLS:  Life alert? No  Use of a cane, walker or w/c? No  Grab bars in the bathroom? Yes  Shower chair or bench in shower? No  Elevated toilet seat or a handicapped toilet? No   TIMED UP AND GO:  Was the test performed? No .  Length of time to ambulate 10 feet:  sec.   Gait steady and fast without use of assistive  device  Cognitive Function:    10/03/2018   11:39 AM 04/02/2017   10:17 AM 04/02/2017    9:50 AM 09/12/2016    9:40 AM  MMSE - Mini Mental State Exam  Orientation to time '4 5 5 5  '$ Orientation to Place '5 5 5 5  '$ Registration '3 3 3 3  '$ Attention/ Calculation '3 4 4 1  '$ Recall 0 '2 2 3  '$ Language- name 2 objects '2 2 2 '$ 2  Language- repeat '1 1 1 1  '$ Language- follow 3 step command '3 3 3 3  '$ Language- read & follow direction '1 1 1 1  '$ Write a sentence 0 '1 1 1  '$ Copy design 1 0 0 0  Total score '23 27 27 25        '$ Immunizations Immunization History  Administered Date(s) Administered   Influenza Split 07/30/2011   Influenza, High Dose Seasonal PF 07/07/2019   Influenza,inj,Quad PF,6+ Mos 07/13/2014, 06/20/2016, 07/03/2017   Influenza-Unspecified 07/29/2018   PFIZER(Purple Top)SARS-COV-2 Vaccination 08/13/2020   Pneumococcal Conjugate-13 09/23/2013   Pneumococcal Polysaccharide-23 09/12/2016   Tdap 01/16/2019   Zoster, Live 09/29/2015    TDAP status: Up to date  Flu Vaccine status: Up to date  Pneumococcal vaccine status: Up to date  Covid-19 vaccine status: Completed vaccines  Qualifies for Shingles Vaccine? Yes   Zostavax completed No   Shingrix Completed?: No.    Education has been provided regarding the importance of this vaccine. Patient has been advised to call insurance company to determine out of pocket expense if they have not yet received this vaccine. Advised may also receive vaccine at local pharmacy or Health Dept. Verbalized acceptance and understanding.  Screening Tests Health Maintenance  Topic Date Due   FOOT EXAM  Never done   Zoster Vaccines- Shingrix (1 of 2) Never done   HEMOGLOBIN A1C  10/09/2014   OPHTHALMOLOGY EXAM  03/29/2018   COVID-19 Vaccine (2 - Pfizer risk series) 09/03/2020   Medicare Annual Wellness (AWV)  10/12/2021   INFLUENZA VACCINE  05/29/2022   DTaP/Tdap/Td (2 - Td or Tdap) 01/15/2029   Pneumonia Vaccine 80+ Years old  Completed   DEXA SCAN   Completed   HPV VACCINES  Aged Out    Health Maintenance  Health Maintenance Due  Topic Date Due   FOOT EXAM  Never done   Zoster Vaccines- Shingrix (1 of 2) Never done   HEMOGLOBIN A1C  10/09/2014   OPHTHALMOLOGY EXAM  03/29/2018   COVID-19 Vaccine (2 - Pfizer risk series) 09/03/2020   Medicare Annual Wellness (AWV)  10/12/2021   INFLUENZA VACCINE  05/29/2022    Colorectal cancer screening: No longer required.   Mammogram status: No longer required due to age.  Bone Density status: Completed 2021. Results reflect: Bone density results: NORMAL. Repeat every   years.  Lung Cancer Screening: (Low Dose CT Chest recommended if Age 85-80 years, 30 pack-year currently smoking OR have quit w/in 15years.) does qualify.   Lung Cancer Screening Referral: n/a  Additional Screening:  Hepatitis C Screening: does qualify; Completed n/a  Vision Screening: Recommended annual ophthalmology exams for early detection of glaucoma and other disorders of the eye. Is the patient up to date with their annual eye exam?  Yes  Who is the provider or what is the name of the office in which the patient attends annual eye exams? N/a If pt is not established with a provider, would they like to be referred to a provider to establish care? No .   Dental Screening: Recommended annual dental exams for proper oral hygiene  Community Resource Referral / Chronic Care Management: CRR required this visit?  No   CCM required this visit?  No      Plan:     I have personally reviewed and noted the following in the patient's chart:   Medical and social history Use of alcohol, tobacco or illicit drugs  Current medications and supplements including opioid prescriptions. Patient is not currently  taking opioid prescriptions. Functional ability and status Nutritional status Physical activity Advanced directives List of other physicians Hospitalizations, surgeries, and ER visits in previous 12  months Vitals Screenings to include cognitive, depression, and falls Referrals and appointments  In addition, I have reviewed and discussed with patient certain preventive protocols, quality metrics, and best practice recommendations. A written personalized care plan for preventive services as well as general preventive health recommendations were provided to patient.     Melene Plan, RMA   10/10/2022   Nurse Notes:

## 2022-10-12 NOTE — Progress Notes (Signed)
Established Patient Office Visit  Subjective    Patient ID: Alexandria Beck, female    DOB: 02-12-1931  Age: 88 y.o. MRN: [id number]  CC: No chief complaint on file.   HPI RASHIA MCKESSON presents for follow up of chronic med issues. Patient denies acute complaints or concerns.    Outpatient Encounter Medications as of 10/10/2022  Medication Sig   acetaminophen (TYLENOL) 500 MG tablet Take 1 tablet (500 mg total) by mouth every 6 (six) hours as needed.   COMIRNATY SUSP injection Inject into the muscle.   hydrOXYzine (ATARAX) 10 MG tablet Take 1 tablet (10 mg total) by mouth 3 (three) times daily as needed.   hydrOXYzine (ATARAX) 25 MG tablet Take 25 mg by mouth daily.   pantoprazole (PROTONIX) 40 MG tablet Take 1 tablet (40 mg total) by mouth daily before breakfast.   rosuvastatin (CRESTOR) 10 MG tablet Take 1 tablet (10 mg total) by mouth daily.   telmisartan (MICARDIS) 40 MG tablet Take 1 tablet (40 mg total) by mouth daily. TAKE 1 TABLET(40 MG) BY MOUTH DAILY FOR BLOOD PRESSURE Strength: 40 mg   traZODone (DESYREL) 50 MG tablet Take 1.5-2 tablets (75-100 mg total) by mouth at bedtime as needed for sleep.   [DISCONTINUED] mirtazapine (REMERON) 15 MG tablet Take 1 tablet (15 mg total) by mouth at bedtime.   mirtazapine (REMERON) 15 MG tablet Take 1 tablet (15 mg total) by mouth at bedtime.   No facility-administered encounter medications on file as of 10/10/2022.    Past Medical History:  Diagnosis Date   Allergy    seasonal   Diverticulosis 2004   Esophageal stricture    GERD (gastroesophageal reflux disease)    has had EGD [id number]   Helicobacter positive gastritis 2006   Hypertension     On no medication. Home readings SBP=150's   Internal hemorrhoids    Irregular heart beat    no testing, holter, etc. Takes no medication   Near syncope    diagnosed as anxiety related. gets some relief with clonazepam. No formal testing done.    Past Surgical History:  Procedure  Laterality Date   ABDOMINAL HYSTERECTOMY  1974   fibroid tumors w/ metorrhagia   G47P4 - NSVD  midwife delivery for two     TONSILLECTOMY     TONSILLECTOMY      Family History  Problem Relation Age of Onset   Heart disease Mother    Stroke Mother    Hypertension Mother    Cancer Father        lung cancer - smoker   Heart disease Sister        MI   Kidney disease Sister        end stage renal disease on HD   Diabetes Sister    Heart disease Brother    Diabetes Sister    Hypertension Sister    Kidney disease Sister    Cancer Sister    Diabetes Sister    Kidney disease Sister        HD   Hypertension Sister    Heart disease Sister    Hypertension Sister    Heart disease Sister    Hypertension Sister    Hypertension Sister     Social History   Socioeconomic History   Marital status: Widowed    Spouse name: Not on file   Number of children: 4   Years of education: 11   Highest education level: Not on file  Occupational History   Occupation: stock person    Comment: retired  Tobacco Use   Smoking status: Never   Smokeless tobacco: Never  Vaping Use   Vaping Use: Never used  Substance and Sexual Activity   Alcohol use: No    Alcohol/week: 0.0 standard drinks of alcohol   Drug use: No   Sexual activity: Not Currently  Other Topics Concern   Not on file  Social History Narrative   Diet:      Do you drink/ eat things with caffeine? yes      Marital status:   widow                            What year were you married ?  1953      Do you live in a house, apartment,assistred living, condo, trailer, etc.)? house      Is it one or more stories? 1 story      How many persons live in your home ?2      Do you have any pets in your home ?(please list)  none      Current or past profession: [employer]      Do you exercise?  yes                            Type & how often:  walking      Do you have a living will?  no      Do you have a DNR form?     no                   If not, do you want to discuss one?       Do you have signed POA?HPOA forms?  no               If so, please bring to your        appointment                     Social Determinants of Health   Financial Resource Strain: Low Risk  (09/17/2017)   Overall Financial Resource Strain (CARDIA)    Difficulty of Paying Living Expenses: Not very hard  Food Insecurity: No Food Insecurity (09/17/2017)   Hunger Vital Sign    Worried About Running Out of Food in the Last Year: Never true    Ran Out of Food in the Last Year: Never true  Transportation Needs: No Transportation Needs (09/17/2017)   PRAPARE - Hydrologist (Medical): No    Lack of Transportation (Non-Medical): No  Physical Activity: Insufficiently Active (10/03/2018)   Exercise Vital Sign    Days of Exercise per Week: 2 days    Minutes of Exercise per Session: 20 min  Stress: No Stress Concern Present (09/17/2017)   [place]    Feeling of Stress : Only a little  Social Connections: Moderately Integrated (09/17/2017)   Social Connection and Isolation Panel [NHANES]    Frequency of Communication with Friends and Family: More than three times a week    Frequency of Social Gatherings with Friends and Family: Three times a week    Attends Religious Services: 1 to 4 times per year    Active Member of Clubs or Organizations: Yes    Attends Archivist  Meetings: 1 to 4 times per year    Marital Status: Widowed  Intimate Partner Violence: Not At Risk (09/17/2017)   Humiliation, Afraid, Rape, and Kick questionnaire    Fear of Current or Ex-Partner: No    Emotionally Abused: No    Physically Abused: No    Sexually Abused: No    Review of Systems  All other systems reviewed and are negative.       Objective    BP 134/86   Pulse 92   Temp 98.1 F (36.7 C) (Oral)   Resp 16   Ht '5\' 1"'$  (1.549 m)   Wt 177 lb 6.4  oz (80.5 kg)   SpO2 93%   BMI 33.52 kg/m   Physical Exam Vitals and nursing note reviewed.  Constitutional:      General: She is not in acute distress. Cardiovascular:     Rate and Rhythm: Normal rate and regular rhythm.  Pulmonary:     Effort: Pulmonary effort is normal.     Breath sounds: Normal breath sounds.  Abdominal:     Palpations: Abdomen is soft.     Tenderness: There is no abdominal tenderness.  Musculoskeletal:     Right lower leg: No edema.     Left lower leg: No edema.  Neurological:     General: No focal deficit present.     Mental Status: She is alert and oriented to person, place, and time.  Psychiatric:        Mood and Affect: Mood normal.        Behavior: Behavior normal.         Assessment & Plan:   1. Essential hypertension Appears stable. continue  2. Generalized anxiety disorder Continue remeron. Hydroxyzine prescribed  3. Insomnia, unspecified type As above  4. Hyperlipidemia, unspecified hyperlipidemia type continue  5. Encounter for Medicare annual wellness exam     Return in about 3 months (around 01/09/2023) for follow up.   Becky Sax, MD

## 2022-10-23 ENCOUNTER — Telehealth: Payer: Self-pay | Admitting: Family Medicine

## 2022-10-23 NOTE — Telephone Encounter (Signed)
Spoke with patient to schedule Medicare Annual Wellness Visit (AWV) either virtually or phone  She stated she had a friend pass away and could not talk right now.  She requested a call back at  a later date      Last AWV  10/03/18 please schedule with Nurse Health Adviser   45 min for awv-i and in office appointments 30 min for awv-s  phone/virtual appointments

## 2022-11-06 ENCOUNTER — Telehealth: Payer: Self-pay | Admitting: Family Medicine

## 2022-11-06 NOTE — Telephone Encounter (Signed)
Tried calling patient to schedule Medicare Annual Wellness Visit (AWV) either virtually /phone . my Herbie Drape number [phone]   Last AWV 10/03/18 please schedule with Nurse Health Adviser

## 2022-11-06 NOTE — Telephone Encounter (Signed)
Chmg-error.  

## 2022-11-30 ENCOUNTER — Other Ambulatory Visit: Payer: Self-pay | Admitting: Family Medicine

## 2022-12-11 ENCOUNTER — Encounter: Payer: Self-pay | Admitting: Family Medicine

## 2022-12-11 ENCOUNTER — Ambulatory Visit (INDEPENDENT_AMBULATORY_CARE_PROVIDER_SITE_OTHER): Payer: Medicare HMO | Admitting: Family Medicine

## 2022-12-11 VITALS — BP 134/84 | HR 82 | Temp 98.1°F | Resp 16 | Wt 182.2 lb

## 2022-12-11 DIAGNOSIS — E785 Hyperlipidemia, unspecified: Secondary | ICD-10-CM | POA: Diagnosis not present

## 2022-12-11 DIAGNOSIS — I10 Essential (primary) hypertension: Secondary | ICD-10-CM | POA: Diagnosis not present

## 2022-12-11 DIAGNOSIS — M5416 Radiculopathy, lumbar region: Secondary | ICD-10-CM

## 2022-12-11 DIAGNOSIS — F411 Generalized anxiety disorder: Secondary | ICD-10-CM | POA: Diagnosis not present

## 2022-12-11 DIAGNOSIS — G4700 Insomnia, unspecified: Secondary | ICD-10-CM

## 2022-12-11 MED ORDER — TRIAMCINOLONE ACETONIDE 40 MG/ML IJ SUSP
40.0000 mg | Freq: Once | INTRAMUSCULAR | Status: AC
  Administered 2022-12-11: 40 mg via INTRAMUSCULAR

## 2022-12-11 NOTE — Progress Notes (Unsigned)
Patient is here for their 3 month follow-up Patient has no concerns today Care gaps have been discussed with patient  Patient given triamcinolone Acetonide 40 mg

## 2022-12-12 ENCOUNTER — Encounter: Payer: Self-pay | Admitting: Family Medicine

## 2022-12-12 NOTE — Progress Notes (Signed)
Established Patient Office Visit  Subjective    Patient ID: Alexandria Beck, female    DOB: 04-18-1931  Age: 89 y.o. MRN: [id number]  CC:  Chief Complaint  Patient presents with   Follow-up   Hypertension    HPI Alexandria Beck presents for routine follow up of chronic med issues. Patient also reports persistent intermittent back pain. She also reports that she dc'd the trazodone because she did not like the way it made her fee.   Outpatient Encounter Medications as of 12/11/2022  Medication Sig   acetaminophen (TYLENOL) 500 MG tablet Take 1 tablet (500 mg total) by mouth every 6 (six) hours as needed.   COMIRNATY SUSP injection Inject into the muscle.   hydrOXYzine (ATARAX) 10 MG tablet Take 1 tablet (10 mg total) by mouth 3 (three) times daily as needed.   hydrOXYzine (ATARAX) 25 MG tablet Take 25 mg by mouth daily.   mirtazapine (REMERON) 15 MG tablet Take 1 tablet (15 mg total) by mouth at bedtime.   pantoprazole (PROTONIX) 40 MG tablet TAKE 1 TABLET(40 MG) BY MOUTH DAILY BEFORE BREAKFAST   rosuvastatin (CRESTOR) 10 MG tablet TAKE 1 TABLET(10 MG) BY MOUTH DAILY   telmisartan (MICARDIS) 40 MG tablet Take 1 tablet (40 mg total) by mouth daily. TAKE 1 TABLET(40 MG) BY MOUTH DAILY FOR BLOOD PRESSURE Strength: 40 mg   [DISCONTINUED] traZODone (DESYREL) 50 MG tablet Take 1.5-2 tablets (75-100 mg total) by mouth at bedtime as needed for sleep.   [EXPIRED] triamcinolone acetonide (KENALOG-40) injection 40 mg    No facility-administered encounter medications on file as of 12/11/2022.    Past Medical History:  Diagnosis Date   Allergy    seasonal   Diverticulosis 2004   Esophageal stricture    GERD (gastroesophageal reflux disease)    has had EGD 99991111   Helicobacter positive gastritis 2006   Hypertension     On no medication. Home readings SBP=150's   Internal hemorrhoids    Irregular heart beat    no testing, holter, etc. Takes no medication   Near syncope    diagnosed as  anxiety related. gets some relief with clonazepam. No formal testing done.    Past Surgical History:  Procedure Laterality Date   ABDOMINAL HYSTERECTOMY  1974   fibroid tumors w/ metorrhagia   G55P4 - NSVD  midwife delivery for two     TONSILLECTOMY     TONSILLECTOMY      Family History  Problem Relation Age of Onset   Heart disease Mother    Stroke Mother    Hypertension Mother    Cancer Father        lung cancer - smoker   Heart disease Sister        MI   Kidney disease Sister        end stage renal disease on HD   Diabetes Sister    Heart disease Brother    Diabetes Sister    Hypertension Sister    Kidney disease Sister    Cancer Sister    Diabetes Sister    Kidney disease Sister        HD   Hypertension Sister    Heart disease Sister    Hypertension Sister    Heart disease Sister    Hypertension Sister    Hypertension Sister     Social History   Socioeconomic History   Marital status: Widowed    Spouse name: Not on file   Number  of children: 4   Years of education: 11   Highest education level: Not on file  Occupational History   Occupation: stock person    Comment: retired  Tobacco Use   Smoking status: Never   Smokeless tobacco: Never  Vaping Use   Vaping Use: Never used  Substance and Sexual Activity   Alcohol use: No    Alcohol/week: 0.0 standard drinks of alcohol   Drug use: No   Sexual activity: Not Currently  Other Topics Concern   Not on file  Social History Narrative   Diet:      Do you drink/ eat things with caffeine? yes      Marital status:   widow                            What year were you married ?  1953      Do you live in a house, apartment,assistred living, condo, trailer, etc.)? house      Is it one or more stories? 1 story      How many persons live in your home ?2      Do you have any pets in your home ?(please list)  none      Current or past profession: [employer]      Do you exercise?  yes                             Type & how often:  walking      Do you have a living will?  no      Do you have a DNR form?     no                  If not, do you want to discuss one?       Do you have signed POA?HPOA forms?  no               If so, please bring to your        appointment                     Social Determinants of Health   Financial Resource Strain: Low Risk  (09/17/2017)   Overall Financial Resource Strain (CARDIA)    Difficulty of Paying Living Expenses: Not very hard  Food Insecurity: No Food Insecurity (09/17/2017)   Hunger Vital Sign    Worried About Running Out of Food in the Last Year: Never true    Ran Out of Food in the Last Year: Never true  Transportation Needs: No Transportation Needs (09/17/2017)   PRAPARE - Hydrologist (Medical): No    Lack of Transportation (Non-Medical): No  Physical Activity: Insufficiently Active (10/03/2018)   Exercise Vital Sign    Days of Exercise per Week: 2 days    Minutes of Exercise per Session: 20 min  Stress: No Stress Concern Present (09/17/2017)   [place]    Feeling of Stress : Only a little  Social Connections: Moderately Integrated (09/17/2017)   Social Connection and Isolation Panel [NHANES]    Frequency of Communication with Friends and Family: More than three times a week    Frequency of Social Gatherings with Friends and Family: Three times a week    Attends Religious Services: 1 to 4 times per  year    Active Member of Clubs or Organizations: Yes    Attends Archivist Meetings: 1 to 4 times per year    Marital Status: Widowed  Intimate Partner Violence: Not At Risk (09/17/2017)   Humiliation, Afraid, Rape, and Kick questionnaire    Fear of Current or Ex-Partner: No    Emotionally Abused: No    Physically Abused: No    Sexually Abused: No    Review of Systems  Musculoskeletal:  Positive for back pain.  All other  systems reviewed and are negative.       Objective    BP 134/84   Pulse 82   Temp 98.1 F (36.7 C) (Oral)   Resp 16   Wt 182 lb 3.2 oz (82.6 kg)   SpO2 96%   BMI 34.43 kg/m   Physical Exam Vitals and nursing note reviewed.  Constitutional:      General: She is not in acute distress. Cardiovascular:     Rate and Rhythm: Normal rate and regular rhythm.  Pulmonary:     Effort: Pulmonary effort is normal.     Breath sounds: Normal breath sounds.  Abdominal:     Palpations: Abdomen is soft.     Tenderness: There is no abdominal tenderness.  Musculoskeletal:     Lumbar back: Tenderness present. No deformity.     Right lower leg: No edema.     Left lower leg: No edema.  Neurological:     General: No focal deficit present.     Mental Status: She is alert and oriented to person, place, and time.  Psychiatric:        Mood and Affect: Mood normal.        Behavior: Behavior normal.         Assessment & Plan:   1. Left lumbar radiculopathy Kenalog IM injection given.  - triamcinolone acetonide (KENALOG-40) injection 40 mg  2. Primary hypertension Appears stable. continue  3. Hyperlipidemia, unspecified hyperlipidemia type Continue   4. Insomnia, unspecified type D/c trazodone. Reviewed sleep hygiene  15. Generalized anxiety disorder Continue remeron.    Return in about 4 months (around 04/11/2023).   Becky Sax, MD

## 2023-03-12 ENCOUNTER — Ambulatory Visit: Payer: Medicare HMO | Admitting: Family Medicine

## 2023-08-21 ENCOUNTER — Encounter: Payer: Self-pay | Admitting: Physician Assistant

## 2023-08-21 ENCOUNTER — Ambulatory Visit: Payer: Medicare HMO | Admitting: Physician Assistant

## 2023-08-21 VITALS — BP 128/79 | HR 90 | Ht 61.0 in | Wt 161.0 lb

## 2023-08-21 DIAGNOSIS — R062 Wheezing: Secondary | ICD-10-CM

## 2023-08-21 DIAGNOSIS — H9312 Tinnitus, left ear: Secondary | ICD-10-CM

## 2023-08-21 DIAGNOSIS — J302 Other seasonal allergic rhinitis: Secondary | ICD-10-CM

## 2023-08-21 DIAGNOSIS — L299 Pruritus, unspecified: Secondary | ICD-10-CM

## 2023-08-21 MED ORDER — CETIRIZINE HCL 10 MG PO TABS: 10.0000 mg | ORAL_TABLET | Freq: Every day | ORAL | 11 refills | Status: AC

## 2023-08-21 NOTE — Patient Instructions (Signed)
To help with your wheezing, I encourage you to start taking Zyrtec on a daily basis.  To help with your itching, I encourage you to continue using lotion and you can also use the hydroxyzine that was previously prescribed if needed.  I strongly encourage you to follow-up with your primary care provider for further evaluation of your tinnitus.  Please let us know if there is anything else we can do for you  Roney Jaffe, PA-C Physician Assistant [facility] [email or web address]   Tinnitus Tinnitus is when you hear a sound that there's no actual source for. It may sound like ringing in your ears or something else. The sound may be loud, soft, or somewhere in between. It can last for a few seconds or be constant for days. It can come and go. Almost everyone has tinnitus at some point. It's not the same as hearing loss. But you may need to see a health care provider if: It lasts for a long time. It comes back often. You have trouble sleeping and focusing. What are the causes? The cause of tinnitus is often unknown. In some cases, you may get it if: You're around loud noises, such as from machines or music. An object gets stuck in your ear. Earwax builds up in Landscape architect. You drink a lot of alcohol or caffeine. You take certain medicines. You start to lose your hearing. You may also get it from some medical conditions. These may include: Ear or sinus infections. Heart diseases. High blood pressure. Allergies. Mnire's disease. Problems with your thyroid. A tumor. This is a growth of cells that isn't normal. A weak, bulging blood vessel called an aneurysm near your ear. What increases the risk? You may be more likely to get tinnitus if: You're around loud noises a lot. You're older. You drink alcohol. You smoke. What are the signs or symptoms? The main symptom is hearing a sound that there's no source for. It may sound like  ringing. It may also sound like: Buzzing. Sizzling. Blowing air. Hissing. Whistling. Other sounds may include: Roaring. Running water. A musical note. Tapping. Humming. You may have symptoms in one ear or both ears. How is this diagnosed? Tinnitus is diagnosed based on your symptoms, your medical history, and an exam. Your provider may do a full hearing test if your tinnitus: Is in just one ear. Makes it hard for you to hear. Lasts 6 months or longer. You may also need to see an expert in hearing disorders called an audiologist. They may ask you about your symptoms and how tinnitus affects your daily life. You may have other tests done. These may include: A CT scan. An MRI. An angiogram. This shows how blood flows through your blood vessels. How is this treated? Treatment may include: Therapy to help you manage the stress of living with tinnitus. Finding ways to mask or cover the sound of tinnitus. These include: Sound or white noise machines. Devices that fit in your ear and play sounds or music. A loud humidifier. Acoustic neural stimulation. This is when you use headphones to listen to music that has a special signal in it. Over time, this signal may change some of the pathways in your brain. This can make you less sensitive to tinnitus. This treatment is used for very severe cases. Using hearing aids or cochlear implants if your tinnitus is from hearing loss. If your tinnitus is caused by a medical condition, treating the condition may make it go  away.  Follow these instructions at home: Managing symptoms     Try to avoid being in loud places or around loud noises. Wear earplugs or headphones when you're around loud noises. Find ways to reduce stress. These may include meditation, yoga, or deep breathing. Sleep with your head slightly raised. General instructions Take over-the-counter and prescription medicines only as told by your provider. Track the things that cause  symptoms (triggers). Try to avoid these things. To stop your tinnitus from getting worse: Do not drink alcohol. Do not have caffeine. Do not use any products that contain nicotine or tobacco. These products include cigarettes, chewing tobacco, and vaping devices, such as e-cigarettes. If you need help quitting, ask your provider. Avoid using too much salt. Get enough sleep each night. Where to find more information American Tinnitus Association: https://www.johnson-[place].org/ Contact a health care provider if: Your symptoms last for 3 weeks or longer without stopping. You have sudden hearing loss. Your symptoms get worse or don't get better with home care. You can't manage the stress of living with tinnitus. Get help right away if: You get tinnitus after a head injury. You have tinnitus and: Dizziness. Nausea and vomiting. Loss of balance. A sudden, severe headache. Changes to your eyesight. Weakness in your face, arms, or legs. These symptoms may be an emergency. Get help right away. Call 911. Do not wait to see if the symptoms will go away. Do not drive yourself to the hospital. This information is not intended to replace advice given to you by your health care provider. Make sure you discuss any questions you have with your health care provider. Document Revised: 01/21/2023 Document Reviewed: 01/21/2023 Elsevier Patient Education  2024 [employer].

## 2023-08-21 NOTE — Progress Notes (Unsigned)
   Established Patient Office Visit  Subjective   Patient ID: Alexandria Beck, female    DOB: 1931-09-13  Age: 89 y.o. MRN: [id number]  Chief Complaint  Patient presents with   Wheezing   Tinnitus    Left ear only    Pruritis    Wheezing Use to have to allergies  Last couple of wheezing - worse in the morning - cough drops  Ringing in ear - 2 months -   [facility] - [facility]th of sept  Ordered ultrasound of ear but have not heard  Ringing and sore on the side  Same  Mostly hear it at night   Mostly at night - itching all over body - 3-4 months  Lotion helps   Use to get allergy injections  Benadryl - does take benadryl every day     {History (Optional):23778}  ROS    Objective:     Ht 5\' 1"  (1.549 m)   Wt 161 lb (73 kg)   BMI 30.42 kg/m  {Vitals History (Optional):23777}  Physical Exam   No results found for any visits on 08/21/23.  {Labs (Optional):23779}  The ASCVD Risk score (Arnett DK, et al., 2019) failed to calculate for the following reasons:   The 2019 ASCVD risk score is only valid for ages 18 to 39    Assessment & Plan:   Problem List Items Addressed This Visit   None   No follow-ups on file.    Kasandra Knudsen Mayers, PA-C

## 2023-08-22 ENCOUNTER — Encounter: Payer: Self-pay | Admitting: Physician Assistant
